# Patient Record
Sex: Female | Born: 1940 | Race: White | Hispanic: No | Smoking: Never smoker
Health system: Southern US, Community
[De-identification: ages and names within clinical notes are randomized; demographics above are authoritative.]

## PROBLEM LIST (undated history)

## (undated) DIAGNOSIS — F32A Depression, unspecified: Secondary | ICD-10-CM

## (undated) DIAGNOSIS — I10 Essential (primary) hypertension: Secondary | ICD-10-CM

## (undated) DIAGNOSIS — I499 Cardiac arrhythmia, unspecified: Secondary | ICD-10-CM

## (undated) DIAGNOSIS — E119 Type 2 diabetes mellitus without complications: Secondary | ICD-10-CM

## (undated) DIAGNOSIS — K760 Fatty (change of) liver, not elsewhere classified: Secondary | ICD-10-CM

## (undated) DIAGNOSIS — G4730 Sleep apnea, unspecified: Secondary | ICD-10-CM

## (undated) DIAGNOSIS — E039 Hypothyroidism, unspecified: Secondary | ICD-10-CM

## (undated) DIAGNOSIS — E785 Hyperlipidemia, unspecified: Secondary | ICD-10-CM

## (undated) DIAGNOSIS — F329 Major depressive disorder, single episode, unspecified: Secondary | ICD-10-CM

## (undated) DIAGNOSIS — R251 Tremor, unspecified: Secondary | ICD-10-CM

## (undated) DIAGNOSIS — R0600 Dyspnea, unspecified: Secondary | ICD-10-CM

## (undated) HISTORY — PX: OTHER SURGICAL HISTORY: SHX169

## (undated) HISTORY — PX: TEAR DUCT PROBING: SHX793

## (undated) HISTORY — PX: BACK SURGERY: SHX140

---

## 2003-11-16 ENCOUNTER — Ambulatory Visit: Payer: Self-pay | Admitting: Pain Medicine

## 2004-07-14 ENCOUNTER — Ambulatory Visit: Payer: Self-pay

## 2004-10-27 ENCOUNTER — Ambulatory Visit: Payer: Self-pay | Admitting: Family Medicine

## 2005-03-30 ENCOUNTER — Ambulatory Visit: Payer: Self-pay | Admitting: Orthopedic Surgery

## 2005-09-25 ENCOUNTER — Ambulatory Visit: Payer: Self-pay | Admitting: Family Medicine

## 2005-09-28 ENCOUNTER — Ambulatory Visit: Payer: Self-pay | Admitting: Surgery

## 2005-09-28 ENCOUNTER — Other Ambulatory Visit: Payer: Self-pay

## 2005-09-29 ENCOUNTER — Ambulatory Visit: Payer: Self-pay | Admitting: Surgery

## 2006-10-08 ENCOUNTER — Ambulatory Visit: Payer: Self-pay | Admitting: Family Medicine

## 2007-03-01 ENCOUNTER — Ambulatory Visit: Payer: Self-pay | Admitting: Family Medicine

## 2007-11-19 ENCOUNTER — Ambulatory Visit: Payer: Self-pay | Admitting: Family Medicine

## 2008-03-26 ENCOUNTER — Ambulatory Visit: Payer: Self-pay | Admitting: Ophthalmology

## 2008-04-06 ENCOUNTER — Ambulatory Visit: Payer: Self-pay | Admitting: Ophthalmology

## 2008-05-05 ENCOUNTER — Ambulatory Visit: Payer: Self-pay | Admitting: Ophthalmology

## 2009-06-01 ENCOUNTER — Ambulatory Visit: Payer: Self-pay | Admitting: Family Medicine

## 2009-06-02 ENCOUNTER — Ambulatory Visit: Payer: Self-pay | Admitting: Family Medicine

## 2009-07-02 ENCOUNTER — Ambulatory Visit: Payer: Self-pay | Admitting: Family Medicine

## 2010-10-13 ENCOUNTER — Ambulatory Visit: Payer: Self-pay | Admitting: Family Medicine

## 2010-12-02 DIAGNOSIS — M1990 Unspecified osteoarthritis, unspecified site: Secondary | ICD-10-CM | POA: Insufficient documentation

## 2010-12-02 DIAGNOSIS — L4050 Arthropathic psoriasis, unspecified: Secondary | ICD-10-CM | POA: Insufficient documentation

## 2011-02-08 DIAGNOSIS — M797 Fibromyalgia: Secondary | ICD-10-CM | POA: Insufficient documentation

## 2011-02-08 DIAGNOSIS — M5030 Other cervical disc degeneration, unspecified cervical region: Secondary | ICD-10-CM | POA: Insufficient documentation

## 2011-03-13 DIAGNOSIS — I872 Venous insufficiency (chronic) (peripheral): Secondary | ICD-10-CM | POA: Insufficient documentation

## 2011-03-13 DIAGNOSIS — I341 Nonrheumatic mitral (valve) prolapse: Secondary | ICD-10-CM | POA: Insufficient documentation

## 2011-05-05 ENCOUNTER — Ambulatory Visit: Payer: Self-pay | Admitting: Unknown Physician Specialty

## 2011-05-30 ENCOUNTER — Ambulatory Visit: Payer: Self-pay | Admitting: Unknown Physician Specialty

## 2011-05-31 LAB — PATHOLOGY REPORT

## 2011-08-30 DIAGNOSIS — M4156 Other secondary scoliosis, lumbar region: Secondary | ICD-10-CM | POA: Insufficient documentation

## 2011-08-30 DIAGNOSIS — M48061 Spinal stenosis, lumbar region without neurogenic claudication: Secondary | ICD-10-CM | POA: Insufficient documentation

## 2011-09-19 DIAGNOSIS — M999 Biomechanical lesion, unspecified: Secondary | ICD-10-CM | POA: Insufficient documentation

## 2012-01-31 DIAGNOSIS — F329 Major depressive disorder, single episode, unspecified: Secondary | ICD-10-CM | POA: Insufficient documentation

## 2012-05-13 ENCOUNTER — Emergency Department: Payer: Self-pay | Admitting: Emergency Medicine

## 2013-10-10 ENCOUNTER — Emergency Department: Payer: Self-pay | Admitting: Emergency Medicine

## 2013-10-15 DIAGNOSIS — S52599A Other fractures of lower end of unspecified radius, initial encounter for closed fracture: Secondary | ICD-10-CM | POA: Insufficient documentation

## 2013-11-26 DIAGNOSIS — R269 Unspecified abnormalities of gait and mobility: Secondary | ICD-10-CM | POA: Insufficient documentation

## 2013-11-26 DIAGNOSIS — R262 Difficulty in walking, not elsewhere classified: Secondary | ICD-10-CM | POA: Insufficient documentation

## 2013-11-26 DIAGNOSIS — M21372 Foot drop, left foot: Secondary | ICD-10-CM | POA: Insufficient documentation

## 2014-02-27 ENCOUNTER — Ambulatory Visit: Payer: Self-pay | Admitting: Family Medicine

## 2014-03-11 ENCOUNTER — Ambulatory Visit: Payer: Self-pay | Admitting: Family Medicine

## 2015-01-27 DIAGNOSIS — E785 Hyperlipidemia, unspecified: Secondary | ICD-10-CM | POA: Insufficient documentation

## 2015-01-27 DIAGNOSIS — E1122 Type 2 diabetes mellitus with diabetic chronic kidney disease: Secondary | ICD-10-CM | POA: Insufficient documentation

## 2015-01-27 DIAGNOSIS — N183 Chronic kidney disease, stage 3 (moderate): Secondary | ICD-10-CM

## 2015-01-27 DIAGNOSIS — E079 Disorder of thyroid, unspecified: Secondary | ICD-10-CM | POA: Insufficient documentation

## 2015-01-27 DIAGNOSIS — K760 Fatty (change of) liver, not elsewhere classified: Secondary | ICD-10-CM | POA: Insufficient documentation

## 2015-01-27 DIAGNOSIS — I10 Essential (primary) hypertension: Secondary | ICD-10-CM | POA: Insufficient documentation

## 2015-11-11 DIAGNOSIS — G8929 Other chronic pain: Secondary | ICD-10-CM | POA: Insufficient documentation

## 2015-11-11 DIAGNOSIS — M2550 Pain in unspecified joint: Secondary | ICD-10-CM

## 2015-11-23 DIAGNOSIS — T85192A Other mechanical complication of implanted electronic neurostimulator (electrode) of spinal cord, initial encounter: Secondary | ICD-10-CM | POA: Insufficient documentation

## 2015-12-10 DIAGNOSIS — G4730 Sleep apnea, unspecified: Secondary | ICD-10-CM | POA: Insufficient documentation

## 2015-12-10 DIAGNOSIS — Z974 Presence of external hearing-aid: Secondary | ICD-10-CM | POA: Insufficient documentation

## 2016-05-31 DIAGNOSIS — R9431 Abnormal electrocardiogram [ECG] [EKG]: Secondary | ICD-10-CM | POA: Insufficient documentation

## 2016-05-31 DIAGNOSIS — R0602 Shortness of breath: Secondary | ICD-10-CM | POA: Insufficient documentation

## 2016-06-05 DIAGNOSIS — M25561 Pain in right knee: Secondary | ICD-10-CM

## 2016-06-05 DIAGNOSIS — G8929 Other chronic pain: Secondary | ICD-10-CM | POA: Insufficient documentation

## 2016-06-12 DIAGNOSIS — I441 Atrioventricular block, second degree: Secondary | ICD-10-CM | POA: Diagnosis present

## 2016-06-12 DIAGNOSIS — I350 Nonrheumatic aortic (valve) stenosis: Secondary | ICD-10-CM | POA: Insufficient documentation

## 2016-06-28 DIAGNOSIS — Z96651 Presence of right artificial knee joint: Secondary | ICD-10-CM | POA: Insufficient documentation

## 2016-10-23 DIAGNOSIS — R42 Dizziness and giddiness: Secondary | ICD-10-CM | POA: Insufficient documentation

## 2016-11-07 DIAGNOSIS — R001 Bradycardia, unspecified: Secondary | ICD-10-CM | POA: Insufficient documentation

## 2016-11-13 ENCOUNTER — Encounter: Payer: Self-pay | Admitting: *Deleted

## 2016-11-13 ENCOUNTER — Other Ambulatory Visit: Payer: Self-pay

## 2016-11-13 ENCOUNTER — Ambulatory Visit
Admission: RE | Admit: 2016-11-13 | Discharge: 2016-11-13 | Disposition: A | Discharge status: Home / Self Care | Payer: Medicare Other | Source: Ambulatory Visit | Attending: Cardiology | Admitting: Cardiology

## 2016-11-13 ENCOUNTER — Encounter
Admission: RE | Admit: 2016-11-13 | Discharge: 2016-11-13 | Disposition: A | Discharge status: Home / Self Care | Payer: Medicare Other | Source: Ambulatory Visit | Attending: Cardiology | Admitting: Cardiology

## 2016-11-13 DIAGNOSIS — Z01812 Encounter for preprocedural laboratory examination: Secondary | ICD-10-CM | POA: Insufficient documentation

## 2016-11-13 DIAGNOSIS — I517 Cardiomegaly: Secondary | ICD-10-CM | POA: Insufficient documentation

## 2016-11-13 DIAGNOSIS — N289 Disorder of kidney and ureter, unspecified: Secondary | ICD-10-CM | POA: Diagnosis not present

## 2016-11-13 DIAGNOSIS — Z01818 Encounter for other preprocedural examination: Secondary | ICD-10-CM

## 2016-11-13 DIAGNOSIS — E669 Obesity, unspecified: Secondary | ICD-10-CM | POA: Diagnosis not present

## 2016-11-13 DIAGNOSIS — Z01810 Encounter for preprocedural cardiovascular examination: Secondary | ICD-10-CM | POA: Insufficient documentation

## 2016-11-13 DIAGNOSIS — I10 Essential (primary) hypertension: Secondary | ICD-10-CM | POA: Insufficient documentation

## 2016-11-13 DIAGNOSIS — F329 Major depressive disorder, single episode, unspecified: Secondary | ICD-10-CM | POA: Diagnosis not present

## 2016-11-13 DIAGNOSIS — G4730 Sleep apnea, unspecified: Secondary | ICD-10-CM | POA: Insufficient documentation

## 2016-11-13 DIAGNOSIS — Z6834 Body mass index (BMI) 34.0-34.9, adult: Secondary | ICD-10-CM | POA: Insufficient documentation

## 2016-11-13 DIAGNOSIS — I4430 Unspecified atrioventricular block: Secondary | ICD-10-CM | POA: Diagnosis not present

## 2016-11-13 DIAGNOSIS — R001 Bradycardia, unspecified: Secondary | ICD-10-CM | POA: Diagnosis not present

## 2016-11-13 DIAGNOSIS — Z7984 Long term (current) use of oral hypoglycemic drugs: Secondary | ICD-10-CM | POA: Diagnosis not present

## 2016-11-13 DIAGNOSIS — I495 Sick sinus syndrome: Secondary | ICD-10-CM | POA: Diagnosis not present

## 2016-11-13 DIAGNOSIS — I289 Disease of pulmonary vessels, unspecified: Secondary | ICD-10-CM | POA: Diagnosis not present

## 2016-11-13 DIAGNOSIS — E119 Type 2 diabetes mellitus without complications: Secondary | ICD-10-CM | POA: Diagnosis not present

## 2016-11-13 HISTORY — DX: Depression, unspecified: F32.A

## 2016-11-13 HISTORY — DX: Cardiac arrhythmia, unspecified: I49.9

## 2016-11-13 HISTORY — DX: Major depressive disorder, single episode, unspecified: F32.9

## 2016-11-13 HISTORY — DX: Hyperlipidemia, unspecified: E78.5

## 2016-11-13 HISTORY — DX: Essential (primary) hypertension: I10

## 2016-11-13 HISTORY — DX: Dyspnea, unspecified: R06.00

## 2016-11-13 HISTORY — DX: Type 2 diabetes mellitus without complications: E11.9

## 2016-11-13 HISTORY — DX: Sleep apnea, unspecified: G47.30

## 2016-11-13 HISTORY — DX: Tremor, unspecified: R25.1

## 2016-11-13 HISTORY — DX: Hypothyroidism, unspecified: E03.9

## 2016-11-13 HISTORY — DX: Fatty (change of) liver, not elsewhere classified: K76.0

## 2016-11-13 LAB — PROTIME-INR
INR: 0.95
Prothrombin Time: 12.6 seconds (ref 11.4–15.2)

## 2016-11-13 LAB — CBC
HEMATOCRIT: 36.3 % (ref 35.0–47.0)
HEMOGLOBIN: 11.9 g/dL — AB (ref 12.0–16.0)
MCH: 29 pg (ref 26.0–34.0)
MCHC: 32.9 g/dL (ref 32.0–36.0)
MCV: 88.2 fL (ref 80.0–100.0)
Platelets: 225 10*3/uL (ref 150–440)
RBC: 4.12 MIL/uL (ref 3.80–5.20)
RDW: 15.8 % — ABNORMAL HIGH (ref 11.5–14.5)
WBC: 8.2 10*3/uL (ref 3.6–11.0)

## 2016-11-13 LAB — BASIC METABOLIC PANEL
ANION GAP: 12 (ref 5–15)
BUN: 31 mg/dL — ABNORMAL HIGH (ref 6–20)
CO2: 26 mmol/L (ref 22–32)
Calcium: 9.6 mg/dL (ref 8.9–10.3)
Chloride: 101 mmol/L (ref 101–111)
Creatinine, Ser: 1.2 mg/dL — ABNORMAL HIGH (ref 0.44–1.00)
GFR calc Af Amer: 50 mL/min — ABNORMAL LOW (ref >60)
GFR, EST NON AFRICAN AMERICAN: 43 mL/min — AB (ref >60)
GLUCOSE: 91 mg/dL (ref 65–99)
POTASSIUM: 4.3 mmol/L (ref 3.5–5.1)
Sodium: 139 mmol/L (ref 135–145)

## 2016-11-13 LAB — SURGICAL PCR SCREEN
MRSA, PCR: NEGATIVE
Staphylococcus aureus: POSITIVE — AB

## 2016-11-13 LAB — APTT: APTT: 32 s (ref 24–36)

## 2016-11-13 NOTE — Patient Instructions (Signed)
Your procedure is scheduled on: 11/16/16 Thur  Report to Same Day Surgery 2nd floor medical mall [facility](Medical Mall Entrance-take elevator on left to 2nd floor.  Check in with surgery information desk.) To find out your arrival time please call [phone] between 1PM - 3PM on 11/15/16 Wed Remember: Instructions that are not followed completely may result in serious medical risk, up to and including death, or upon the discretion of your surgeon and anesthesiologist your surgery may need to be rescheduled.    _x___ 1. Do not eat food after midnight the night before your procedure. You may drink clear liquids up to 2 hours before you are scheduled to arrive at the hospital for your procedure.  Do not drink clear liquids within 2 hours of your scheduled arrival to the hospital.  Clear liquids include  --Water or Apple juice without pulp  --Clear carbohydrate beverage such as ClearFast or Gatorade  --Black Coffee or Clear Tea (No milk, no creamers, do not add anything to                  the coffee or Tea Type 1 and type 2 diabetics should only drink water.  No gum chewing or hard candies.     __x__ 2. No Alcohol for 24 hours before or after surgery.   __x__3. No Smoking for 24 prior to surgery.   ____  4. Bring all medications with you on the day of surgery if instructed.    __x__ 5. Notify your doctor if there is any change in your medical condition     (cold, fever, infections).     Do not wear jewelry, make-up, hairpins, clips or nail polish.  Do not wear lotions, powders, or perfumes. You may wear deodorant.  Do not shave 48 hours prior to surgery. Men may shave face and neck.  Do not bring valuables to the hospital.    [facility] is not responsible for any belongings or valuables.               Contacts, dentures or bridgework may not be worn into surgery.  Leave your suitcase in the car. After surgery it may be brought to your room.  For patients admitted to the hospital,  discharge time is determined by your                       treatment team.   Patients discharged the day of surgery will not be allowed to drive home.  You will need someone to drive you home and stay with you the night of your procedure.    Please read over the following fact sheets that you were given:   [facility] and or MRSA Information   _x___ Take anti-hypertensive listed below, cardiac, seizure, asthma,     anti-reflux and psychiatric medicines. These include:  1. buPROPion (WELLBUTRIN XL)  2.DULoxetine (CYMBALTA) 60 MG capsule  3.levothyroxine (SYNTHROID, LEVOTHROID) 75 MCG tablet  4.  5.  6.  ____Fleets enema or Magnesium Citrate as directed.   _x___ Use CHG Soap or sage wipes as directed on instruction sheet   ____ Use inhalers on the day of surgery and bring to hospital day of surgery  _x___ Stop Metformin and Janumet 2 days prior to surgery.    ____ Take 1/2 of usual insulin dose the night before surgery and none on the morning     surgery.   _x___ Follow recommendations from Cardiologist, Pulmonologist  or PCP regarding          stopping Aspirin, Coumadin, Plavix ,Eliquis, Effient, or Pradaxa, and Pletal.  X____Stop Anti-inflammatories such as Advil, Aleve, Ibuprofen, Motrin, Naproxen, Naprosyn, Goodies powders or aspirin products. OK to take Tylenol and                          Celebrex.   _x___ Stop supplements until after surgery.  But may continue Vitamin D, Vitamin B,       and multivitamin.   ____ Bring C-Pap to the hospital.

## 2016-11-15 MED ORDER — CEFAZOLIN SODIUM-DEXTROSE 1-4 GM/50ML-% IV SOLN: 1.0000 g | Freq: Once | INTRAVENOUS | Status: DC

## 2016-11-16 ENCOUNTER — Other Ambulatory Visit: Payer: Self-pay

## 2016-11-16 ENCOUNTER — Encounter
Admission: RE | Disposition: A | Discharge status: Home / Self Care | Payer: Self-pay | Source: Ambulatory Visit | Attending: Cardiology

## 2016-11-16 ENCOUNTER — Ambulatory Visit: Payer: Medicare Other

## 2016-11-16 ENCOUNTER — Encounter: Payer: Self-pay | Admitting: *Deleted

## 2016-11-16 ENCOUNTER — Observation Stay: Payer: Medicare Other

## 2016-11-16 ENCOUNTER — Ambulatory Visit: Payer: Medicare Other | Admitting: Anesthesiology

## 2016-11-16 ENCOUNTER — Observation Stay
Admission: RE | Admit: 2016-11-16 | Discharge: 2016-11-17 | Disposition: A | Discharge status: Home / Self Care | Payer: Medicare Other | Source: Ambulatory Visit | Attending: Cardiology | Admitting: Cardiology

## 2016-11-16 DIAGNOSIS — I341 Nonrheumatic mitral (valve) prolapse: Secondary | ICD-10-CM | POA: Diagnosis not present

## 2016-11-16 DIAGNOSIS — E669 Obesity, unspecified: Secondary | ICD-10-CM | POA: Insufficient documentation

## 2016-11-16 DIAGNOSIS — Z950 Presence of cardiac pacemaker: Secondary | ICD-10-CM

## 2016-11-16 DIAGNOSIS — Z85828 Personal history of other malignant neoplasm of skin: Secondary | ICD-10-CM | POA: Insufficient documentation

## 2016-11-16 DIAGNOSIS — I495 Sick sinus syndrome: Principal | ICD-10-CM | POA: Insufficient documentation

## 2016-11-16 DIAGNOSIS — E1140 Type 2 diabetes mellitus with diabetic neuropathy, unspecified: Secondary | ICD-10-CM | POA: Insufficient documentation

## 2016-11-16 DIAGNOSIS — Z6834 Body mass index (BMI) 34.0-34.9, adult: Secondary | ICD-10-CM | POA: Diagnosis not present

## 2016-11-16 DIAGNOSIS — K760 Fatty (change of) liver, not elsewhere classified: Secondary | ICD-10-CM | POA: Insufficient documentation

## 2016-11-16 DIAGNOSIS — I129 Hypertensive chronic kidney disease with stage 1 through stage 4 chronic kidney disease, or unspecified chronic kidney disease: Secondary | ICD-10-CM | POA: Insufficient documentation

## 2016-11-16 DIAGNOSIS — I441 Atrioventricular block, second degree: Secondary | ICD-10-CM | POA: Diagnosis not present

## 2016-11-16 DIAGNOSIS — E1122 Type 2 diabetes mellitus with diabetic chronic kidney disease: Secondary | ICD-10-CM | POA: Insufficient documentation

## 2016-11-16 DIAGNOSIS — I350 Nonrheumatic aortic (valve) stenosis: Secondary | ICD-10-CM | POA: Insufficient documentation

## 2016-11-16 DIAGNOSIS — E785 Hyperlipidemia, unspecified: Secondary | ICD-10-CM | POA: Insufficient documentation

## 2016-11-16 DIAGNOSIS — H9191 Unspecified hearing loss, right ear: Secondary | ICD-10-CM | POA: Insufficient documentation

## 2016-11-16 DIAGNOSIS — Z7984 Long term (current) use of oral hypoglycemic drugs: Secondary | ICD-10-CM | POA: Insufficient documentation

## 2016-11-16 DIAGNOSIS — E039 Hypothyroidism, unspecified: Secondary | ICD-10-CM | POA: Diagnosis not present

## 2016-11-16 DIAGNOSIS — Z885 Allergy status to narcotic agent status: Secondary | ICD-10-CM | POA: Insufficient documentation

## 2016-11-16 DIAGNOSIS — Z79899 Other long term (current) drug therapy: Secondary | ICD-10-CM | POA: Diagnosis not present

## 2016-11-16 DIAGNOSIS — N189 Chronic kidney disease, unspecified: Secondary | ICD-10-CM | POA: Insufficient documentation

## 2016-11-16 HISTORY — PX: PACEMAKER INSERTION: SHX728

## 2016-11-16 LAB — GLUCOSE, CAPILLARY
GLUCOSE-CAPILLARY: 125 mg/dL — AB (ref 65–99)
Glucose-Capillary: 90 mg/dL (ref 65–99)

## 2016-11-16 SURGERY — INSERTION, CARDIAC PACEMAKER
Anesthesia: General | Site: Chest | Wound class: Clean

## 2016-11-16 MED ORDER — FENTANYL CITRATE (PF) 100 MCG/2ML IJ SOLN: 25.0000 ug | INTRAMUSCULAR | Status: DC | PRN

## 2016-11-16 MED ORDER — SODIUM CHLORIDE 0.9% FLUSH
10.0000 mL | Freq: Three times a day (TID) | INTRAVENOUS | Status: DC
  Administered 2016-11-16: 10 mL via INTRAVENOUS

## 2016-11-16 MED ORDER — PROPOFOL 500 MG/50ML IV EMUL
INTRAVENOUS | Status: DC | PRN
  Administered 2016-11-16: 50 ug/kg/min via INTRAVENOUS

## 2016-11-16 MED ORDER — LISINOPRIL 20 MG PO TABS
30.0000 mg | ORAL_TABLET | Freq: Every day | ORAL | Status: DC
  Administered 2016-11-17: 30 mg via ORAL
  Filled 2016-11-16: qty 1

## 2016-11-16 MED ORDER — GENTAMICIN SULFATE 40 MG/ML IJ SOLN
Freq: Once | INTRAMUSCULAR | Status: DC
  Filled 2016-11-16 (×3): qty 2

## 2016-11-16 MED ORDER — LEVOTHYROXINE SODIUM 75 MCG PO TABS
75.0000 ug | ORAL_TABLET | Freq: Every day | ORAL | Status: DC
  Administered 2016-11-17: 75 ug via ORAL
  Filled 2016-11-16: qty 3
  Filled 2016-11-16: qty 1

## 2016-11-16 MED ORDER — DEXTROSE 5 % IV SOLN
1.0000 g | Freq: Four times a day (QID) | INTRAVENOUS | Status: DC
  Filled 2016-11-16 (×3): qty 10

## 2016-11-16 MED ORDER — SODIUM CHLORIDE 0.9 % IJ SOLN
INTRAMUSCULAR | Status: AC
  Filled 2016-11-16: qty 50

## 2016-11-16 MED ORDER — DULOXETINE HCL 60 MG PO CPEP
60.0000 mg | ORAL_CAPSULE | Freq: Every day | ORAL | Status: DC
  Administered 2016-11-17: 60 mg via ORAL
  Filled 2016-11-16: qty 2
  Filled 2016-11-16: qty 1

## 2016-11-16 MED ORDER — SODIUM CHLORIDE 0.9 % IR SOLN
Status: DC | PRN
  Administered 2016-11-16: 200 mL

## 2016-11-16 MED ORDER — LACTATED RINGERS IV SOLN
INTRAVENOUS | Status: DC | PRN
  Administered 2016-11-16: 16:00:00 via INTRAVENOUS

## 2016-11-16 MED ORDER — FLUTICASONE PROPIONATE 50 MCG/ACT NA SUSP
2.0000 | Freq: Every day | NASAL | Status: DC
  Filled 2016-11-16: qty 16

## 2016-11-16 MED ORDER — ATORVASTATIN CALCIUM 40 MG PO TABS
40.0000 mg | ORAL_TABLET | Freq: Every day | ORAL | Status: DC
  Administered 2016-11-16: 40 mg via ORAL
  Filled 2016-11-16: qty 1
  Filled 2016-11-16: qty 2

## 2016-11-16 MED ORDER — LIDOCAINE 1 % OPTIME INJ - NO CHARGE
INTRAMUSCULAR | Status: DC | PRN
  Administered 2016-11-16: 30 mL

## 2016-11-16 MED ORDER — FENTANYL CITRATE (PF) 100 MCG/2ML IJ SOLN
INTRAMUSCULAR | Status: DC | PRN
  Administered 2016-11-16: 25 ug via INTRAVENOUS

## 2016-11-16 MED ORDER — FENTANYL CITRATE (PF) 100 MCG/2ML IJ SOLN
INTRAMUSCULAR | Status: AC
  Filled 2016-11-16: qty 2

## 2016-11-16 MED ORDER — HYDROCHLOROTHIAZIDE 12.5 MG PO CAPS
12.5000 mg | ORAL_CAPSULE | Freq: Every day | ORAL | Status: DC
  Administered 2016-11-17: 12.5 mg via ORAL
  Filled 2016-11-16: qty 1

## 2016-11-16 MED ORDER — SODIUM CHLORIDE FLUSH 0.9 % IV SOLN
INTRAVENOUS | Status: AC
  Filled 2016-11-16: qty 10

## 2016-11-16 MED ORDER — MEPERIDINE HCL 50 MG/ML IJ SOLN: 6.2500 mg | INTRAMUSCULAR | Status: DC | PRN

## 2016-11-16 MED ORDER — ONDANSETRON HCL 4 MG/2ML IJ SOLN: 4.0000 mg | Freq: Four times a day (QID) | INTRAMUSCULAR | Status: DC | PRN

## 2016-11-16 MED ORDER — FAMOTIDINE 20 MG PO TABS
ORAL_TABLET | ORAL | Status: AC
  Administered 2016-11-16: 20 mg
  Filled 2016-11-16: qty 1

## 2016-11-16 MED ORDER — ACETAMINOPHEN 325 MG PO TABS
325.0000 mg | ORAL_TABLET | ORAL | Status: DC | PRN
  Administered 2016-11-17 (×2): 325 mg via ORAL
  Filled 2016-11-16: qty 1
  Filled 2016-11-16: qty 2

## 2016-11-16 MED ORDER — PROPOFOL 500 MG/50ML IV EMUL
INTRAVENOUS | Status: AC
  Filled 2016-11-16: qty 50

## 2016-11-16 MED ORDER — CEFAZOLIN SODIUM-DEXTROSE 1-4 GM/50ML-% IV SOLN
INTRAVENOUS | Status: AC
Start: 2016-11-16 — End: 2016-11-17
  Filled 2016-11-16: qty 50

## 2016-11-16 MED ORDER — FAMOTIDINE 20 MG PO TABS: 20.0000 mg | ORAL_TABLET | Freq: Once | ORAL | Status: DC

## 2016-11-16 MED ORDER — PROPOFOL 10 MG/ML IV BOLUS
INTRAVENOUS | Status: DC | PRN
  Administered 2016-11-16: 20 mg via INTRAVENOUS
  Administered 2016-11-16: 30 mg via INTRAVENOUS

## 2016-11-16 MED ORDER — DEXTROSE 5 % IV SOLN
1.0000 g | Freq: Four times a day (QID) | INTRAVENOUS | Status: AC
  Administered 2016-11-16 – 2016-11-17 (×3): 1 g via INTRAVENOUS
  Filled 2016-11-16 (×3): qty 10

## 2016-11-16 MED ORDER — METFORMIN HCL 500 MG PO TABS
500.0000 mg | ORAL_TABLET | Freq: Two times a day (BID) | ORAL | Status: DC
Start: 2016-11-16 — End: 2016-11-17
  Administered 2016-11-17: 500 mg via ORAL
  Filled 2016-11-16: qty 1

## 2016-11-16 MED ORDER — LIDOCAINE HCL (PF) 2 % IJ SOLN
INTRAMUSCULAR | Status: AC
  Filled 2016-11-16: qty 10

## 2016-11-16 MED ORDER — MIDAZOLAM HCL 2 MG/2ML IJ SOLN
INTRAMUSCULAR | Status: AC
  Filled 2016-11-16: qty 2

## 2016-11-16 MED ORDER — LIDOCAINE HCL (CARDIAC) 20 MG/ML IV SOLN
INTRAVENOUS | Status: DC | PRN
  Administered 2016-11-16: 60 mg via INTRAVENOUS

## 2016-11-16 MED ORDER — GENTAMICIN SULFATE 40 MG/ML IJ SOLN
INTRAMUSCULAR | Status: AC
  Filled 2016-11-16: qty 2

## 2016-11-16 MED ORDER — SODIUM CHLORIDE 0.9 % IV SOLN
INTRAVENOUS | Status: DC
  Administered 2016-11-16: 13:00:00 via INTRAVENOUS

## 2016-11-16 MED ORDER — MIDAZOLAM HCL 2 MG/2ML IJ SOLN
INTRAMUSCULAR | Status: DC | PRN
  Administered 2016-11-16: 1 mg via INTRAVENOUS

## 2016-11-16 MED ORDER — PIOGLITAZONE HCL 15 MG PO TABS
15.0000 mg | ORAL_TABLET | Freq: Every day | ORAL | Status: DC
  Administered 2016-11-17: 15 mg via ORAL
  Filled 2016-11-16: qty 1

## 2016-11-16 MED ORDER — BUPROPION HCL ER (XL) 150 MG PO TB24
150.0000 mg | ORAL_TABLET | Freq: Every day | ORAL | Status: DC
  Administered 2016-11-17: 150 mg via ORAL
  Filled 2016-11-16 (×2): qty 1

## 2016-11-16 MED ORDER — PROMETHAZINE HCL 25 MG/ML IJ SOLN: 6.2500 mg | INTRAMUSCULAR | Status: DC | PRN

## 2016-11-16 SURGICAL SUPPLY — 40 items
BAG DECANTER FOR FLEXI CONT (MISCELLANEOUS) ×2 IMPLANT
BRUSH SCRUB EZ  4% CHG (MISCELLANEOUS) ×1
BRUSH SCRUB EZ 4% CHG (MISCELLANEOUS) ×1 IMPLANT
CABLE SURG 12 DISP A/V CHANNEL (MISCELLANEOUS) ×2 IMPLANT
CANISTER SUCT 1200ML W/VALVE (MISCELLANEOUS) ×2 IMPLANT
CHLORAPREP W/TINT 26ML (MISCELLANEOUS) ×2 IMPLANT
COVER LIGHT HANDLE STERIS (MISCELLANEOUS) ×4 IMPLANT
COVER MAYO STAND STRL (DRAPES) ×2 IMPLANT
DRAPE C-ARM XRAY 36X54 (DRAPES) ×2 IMPLANT
DRSG TEGADERM 4X4.75 (GAUZE/BANDAGES/DRESSINGS) ×2 IMPLANT
DRSG TELFA 4X3 1S NADH ST (GAUZE/BANDAGES/DRESSINGS) ×2 IMPLANT
ELECT REM PT RETURN 9FT ADLT (ELECTROSURGICAL) ×2
ELECTRODE REM PT RTRN 9FT ADLT (ELECTROSURGICAL) ×1 IMPLANT
GLOVE BIO SURGEON STRL SZ7.5 (GLOVE) ×2 IMPLANT
GLOVE BIO SURGEON STRL SZ8 (GLOVE) ×2 IMPLANT
GOWN STRL REUS W/ TWL LRG LVL3 (GOWN DISPOSABLE) ×1 IMPLANT
GOWN STRL REUS W/ TWL XL LVL3 (GOWN DISPOSABLE) ×1 IMPLANT
GOWN STRL REUS W/TWL LRG LVL3 (GOWN DISPOSABLE) ×1
GOWN STRL REUS W/TWL XL LVL3 (GOWN DISPOSABLE) ×1
HANDLE YANKAUER SUCT BULB TIP (MISCELLANEOUS) ×2 IMPLANT
IMMOBILIZER SHDR MD LX WHT (SOFTGOODS) IMPLANT
IMMOBILIZER SHDR XL LX WHT (SOFTGOODS) ×2 IMPLANT
INTRO PACEMAKR LEAD 9FR 13CM (INTRODUCER) ×2
INTRO PACEMKR SHEATH II 7FR (MISCELLANEOUS) ×4
INTRODUCER PACEMKR LD 9FR 13CM (INTRODUCER) ×1 IMPLANT
INTRODUCER PACEMKR SHTH II 7FR (MISCELLANEOUS) ×2 IMPLANT
IPG PACE AZUR XT DR MRI W1DR01 (Pacemaker) ×1 IMPLANT
IRRIGATOR SUCT 8 DISP DVNC XI (IRRIGATION / IRRIGATOR) ×1 IMPLANT
IRRIGATOR SUCTION 8MM XI DISP (IRRIGATION / IRRIGATOR) ×1
IV NS 500ML (IV SOLUTION) ×1
IV NS 500ML BAXH (IV SOLUTION) ×1 IMPLANT
KIT RM TURNOVER STRD PROC AR (KITS) ×2 IMPLANT
LABEL OR SOLS (LABEL) ×2 IMPLANT
LEAD CAPSURE NOVUS 5076-52CM (Lead) ×2 IMPLANT
LEAD CAPSURE NOVUS 5076-58CM (Lead) ×2 IMPLANT
MARKER SKIN DUAL TIP RULER LAB (MISCELLANEOUS) ×2 IMPLANT
PACE AZURE XT DR MRI W1DR01 (Pacemaker) ×2 IMPLANT
PACK PACE INSERTION (MISCELLANEOUS) ×2 IMPLANT
PAD ONESTEP ZOLL R SERIES ADT (MISCELLANEOUS) ×2 IMPLANT
SUT SILK 0 SH 30 (SUTURE) ×6 IMPLANT

## 2016-11-16 NOTE — Anesthesia Post-op Follow-up Note (Signed)
Anesthesia QCDR form completed.        

## 2016-11-16 NOTE — Anesthesia Preprocedure Evaluation (Signed)
Anesthesia Evaluation  Patient identified by MRN, date of birth, ID band Patient awake    Reviewed: Allergy & Precautions, NPO status , Patient's Chart, lab work & pertinent test results  History of Anesthesia Complications Negative for: history of anesthetic complications  Airway Mallampati: II  TM Distance: >3 FB Neck ROM: Full    Dental  (+) Poor Dentition   Pulmonary sleep apnea , neg COPD,    breath sounds clear to auscultation- rhonchi (-) wheezing      Cardiovascular hypertension, Pt. on medications (-) CAD, (-) Past MI and (-) Cardiac Stents  Rhythm:Regular Rate:Normal - Systolic murmurs and - Diastolic murmurs    Neuro/Psych PSYCHIATRIC DISORDERS Depression negative neurological ROS     GI/Hepatic negative GI ROS, Fatty liver   Endo/Other  diabetes, Oral Hypoglycemic AgentsHypothyroidism   Renal/GU Renal InsufficiencyRenal disease     Musculoskeletal negative musculoskeletal ROS (+)   Abdominal (+) + obese,   Peds  Hematology negative hematology ROS (+)   Anesthesia Other Findings Past Medical History: No date: Depression No date: Diabetes mellitus without complication (HCC) No date: Dyspnea No date: Dysrhythmia No date: Elevated lipids No date: Fatty liver No date: Hypertension No date: Hypothyroidism No date: Sleep apnea No date: Tremors of nervous system   Reproductive/Obstetrics                             Anesthesia Physical Anesthesia Plan  ASA: III  Anesthesia Plan: General   Post-op Pain Management:    Induction: Intravenous  PONV Risk Score and Plan: 2 and Propofol infusion  Airway Management Planned: Natural Airway  Additional Equipment:   Intra-op Plan:   Post-operative Plan:   Informed Consent: I have reviewed the patients History and Physical, chart, labs and discussed the procedure including the risks, benefits and alternatives for the  proposed anesthesia with the patient or authorized representative who has indicated his/her understanding and acceptance.   Dental advisory given  Plan Discussed with: CRNA and Anesthesiologist  Anesthesia Plan Comments:         Anesthesia Quick Evaluation

## 2016-11-16 NOTE — Interval H&P Note (Signed)
History and Physical Interval Note:  11/16/2016 11:55 AM  Chelsea Ruiz  has presented today for surgery, with the diagnosis of SSS  The various methods of treatment have been discussed with the patient and family. After consideration of risks, benefits and other options for treatment, the patient has consented to  Procedure(s): INSERTION PACEMAKER (N/A) as a surgical intervention .  The patient's history has been reviewed, patient examined, no change in status, stable for surgery.  I have reviewed the patient's chart and labs.  Questions were answered to the patient's satisfaction.     Beatris Belen [employer]

## 2016-11-16 NOTE — Plan of Care (Signed)
  Progressing Education: Knowledge of General Education information will improve 11/16/2016 1905 - Progressing by Donald ProseBerry, Kiev Labrosse L, RN Clinical Measurements: Ability to maintain clinical measurements within normal limits will improve 11/16/2016 1905 - Progressing by Donald ProseBerry, Muhanad Torosyan L, RN Pain Managment: General experience of comfort will improve 11/16/2016 1905 - Progressing by Donald ProseBerry, Denys Labree L, RN Note Prn medications   Not Progressing Clinical Measurements: Will remain free from infection 11/16/2016 1905 - Not Progressing by Donald ProseBerry, Sonnet Rizor L, RN Note On IV antibiotics every 6 hours Activity: Risk for activity intolerance will decrease 11/16/2016 1905 - Not Progressing by Donald ProseBerry, Tenelle Andreason L, RN Note Bedrest with bathroom privileges till am

## 2016-11-16 NOTE — Progress Notes (Signed)
76 yo female admitted from PACU s/p pacemaker.  No distress on ra.  Cardiac monitor applied and verified, pt denies chest pain.  Lungs clear bil   Dressing to lt shoulder dry, old drainage noted and circled.  Shoulder immobilizer in place.  SL rt hand, lt ac flushes well.  Pt denies need, CB in reach, SR up x 2.  POC reviewed with pt and daughter.

## 2016-11-16 NOTE — Anesthesia Postprocedure Evaluation (Signed)
Anesthesia Post Note  Patient: Chelsea Ruiz  Procedure(s) Performed: INSERTION PACEMAKER (N/A Chest)  Patient location during evaluation: PACU Anesthesia Type: General Level of consciousness: awake and alert Pain management: pain level controlled Vital Signs Assessment: post-procedure vital signs reviewed and stable Respiratory status: spontaneous breathing and respiratory function stable Cardiovascular status: stable Anesthetic complications: no     Last Vitals:  Vitals:   11/16/16 1247 11/16/16 1651  BP: (!) 183/81 (!) (P) 152/90  Pulse: 60 (P) 93  Resp: 18 (P) 12  Temp: 36.6 C (P) 36.4 C  SpO2: 99% (P) 100%    Last Pain:  Vitals:   11/16/16 1247  TempSrc: Oral  PainSc: 5                  Sandra Tellefsen K

## 2016-11-16 NOTE — H&P (Signed)
Jump to Section ? Document InformationEncounter DetailsGoalsLast Filed Vital SignsPatient DemographicsPatient InstructionsPlan of TreatmentProgress NotesReason for VisitSocial HistoryVisit Diagnoses Linton RumpKatherine M Silveria Encounter Summary, generated on Nov. 15, 2018 Printout Information  Document Contents Document Received Date Document Source Organization  [facility] Visit Nov. 15, 2018 [facility]   Patient Demographics - 76 y.o. Female; born Jun. 20, 1942   Patient Address Communication Language Race / Ethnicity  [address] [place], [place] [address] [phone] [facility](Home) [phone] (Work) [phone] [facility](Mobile) [phone] (Mobile) [email or web address] English (Preferred) Cliffton AstersWhite / Not Hispanic or Latino   Reason for Visit   Reason Comments  Follow-up holter    Encounter Details   Date Type Department Care Team Description  11/07/2016 [facility]  [address]  [place], [place] [address]  [phone]  Marcina MillardParaschos, Jadaya Sommerfield, MD  [address]  [facility]  [place], [place] [address]  [phone]  [phone] (Fax)  Venous insufficiency (Primary Dx);  Nonrheumatic aortic valve stenosis;  Mobitz type 1 second degree atrioventricular block;  Mitral valve prolapse;  Essential hypertension;  Sleep apnea, unspecified type;  Type 2 diabetes mellitus without complication, unspecified whether long term insulin use (CMS-HCC);  SOB (shortness of breath) on exertion;  Lightheadedness;  Hyperlipidemia, unspecified hyperlipidemia type;  Mobitz type 2 second degree atrioventricular block;  Bradycardia, sinus   Social History - as of this encounter  Tobacco Use Types Packs/Day Years Used Date  Never Smoker      Smokeless Tobacco: Never Used      Alcohol Use Drinks/Week oz/Week Comments  No 0 Standard drinks or equivalent  0.0     Sex Assigned at [employer] CorporationBirth Date Recorded  Not on  file    Job Start Date Occupation Industry  Not on file Not on file Not on file   Travel History Travel Start Travel End  No recent travel history available.     Last Filed Vital Signs - in this encounter  Vital Sign Reading Time Taken  Blood Pressure 118/60 11/07/2016 9:45 AM EST  Pulse 56 11/07/2016 9:45 AM EST  Temperature - -  Respiratory Rate - -  Oxygen Saturation - -  Inhaled Oxygen Concentration - -  Weight 77.1 kg (170 lb) 11/07/2016 9:45 AM EST  Height 149.9 cm (4\' 11" ) 11/07/2016 9:45 AM EST  Body Mass Index 34.34 11/07/2016 9:45 AM EST   Patient Instructions - in this encounter  Patient Instructions Marcina MillardParaschos, Higinio Grow, MD - 11/07/2016 9:45 AM EST   Patient Education    DASH Diet: Care Instructions Your Care Instructions  The DASH diet is an eating plan that can help lower your blood pressure. DASH stands for Dietary Approaches to Stop Hypertension. Hypertension is high blood pressure. The DASH diet focuses on eating foods that are high in calcium, potassium, and magnesium. These nutrients can lower blood pressure. The foods that are highest in these nutrients are fruits, vegetables, low-fat dairy products, nuts, seeds, and legumes. But taking calcium, potassium, and magnesium supplements instead of eating foods that are high in those nutrients does not have the same effect. The DASH diet also includes whole grains, fish, and poultry. The DASH diet is one of several lifestyle changes your doctor may recommend to lower your high blood pressure. Your doctor may also want you to decrease the amount of sodium in your diet. Lowering sodium while following the DASH diet can lower blood pressure even further than just the DASH diet alone. Follow-up care is a key part of your  treatment and safety. Be sure to make and go to all appointments, and call your doctor if you are having problems. It's also a good idea to know your test results and keep a list of the medicines you  take. How can you care for yourself at home? Following the DASH diet  Eat 4 to 5 servings of fruit each day. A serving is 1 medium-sized piece of fruit,  cup chopped or canned fruit, 1/4 cup dried fruit, or 4 ounces ( cup) of fruit juice. Choose fruit more often than fruit juice.  Eat 4 to 5 servings of vegetables each day. A serving is 1 cup of lettuce or raw leafy vegetables,  cup of chopped or cooked vegetables, or 4 ounces ( cup) of vegetable juice. Choose vegetables more often than vegetable juice.  Get 2 to 3 servings of low-fat and fat-free dairy each day. A serving is 8 ounces of milk, 1 cup of yogurt, or 1  ounces of cheese.  Eat 6 to 8 servings of grains each day. A serving is 1 slice of bread, 1 ounce of dry cereal, or  cup of cooked rice, pasta, or cooked cereal. Try to choose whole-grain products as much as possible.  Limit lean meat, poultry, and fish to 2 servings each day. A serving is 3 ounces, about the size of a deck of cards.  Eat 4 to 5 servings of nuts, seeds, and legumes (cooked dried beans, lentils, and split peas) each week. A serving is 1/3 cup of nuts, 2 tablespoons of seeds, or  cup of cooked beans or peas.  Limit fats and oils to 2 to 3 servings each day. A serving is 1 teaspoon of vegetable oil or 2 tablespoons of salad dressing.  Limit sweets and added sugars to 5 servings or less a week. A serving is 1 tablespoon jelly or jam,  cup sorbet, or 1 cup of lemonade.  Eat less than 2,300 milligrams (mg) of sodium a day. If you limit your sodium to 1,500 mg a day, you can lower your blood pressure even more. Tips for success  Start small. Do not try to make dramatic changes to your diet all at once. You might feel that you are missing out on your favorite foods and then be more likely to not follow the plan. Make small changes, and stick with them. Once those changes become habit, add a few more changes.  Try some of the following: ? Make it a goal to eat a  fruit or vegetable at every meal and at snacks. This will make it easy to get the recommended amount of fruits and vegetables each day. ? Try yogurt topped with fruit and nuts for a snack or healthy dessert. ? Add lettuce, tomato, cucumber, and onion to sandwiches. ? Combine a ready-made pizza crust with low-fat mozzarella cheese and lots of vegetable toppings. Try using tomatoes, squash, spinach, broccoli, carrots, cauliflower, and onions. ? Have a variety of cut-up vegetables with a low-fat dip as an appetizer instead of chips and dip. ? Sprinkle sunflower seeds or chopped almonds over salads. Or try adding chopped walnuts or almonds to cooked vegetables. ? Try some vegetarian meals using beans and peas. Add garbanzo or kidney beans to salads. Make burritos and tacos with mashed pinto beans or black beans. Where can you learn more? Log in to your [facility] account at [email or web address] and click on top menu option "Health" then select "Search Medical Library". Enter [phone] in the search  box and click the magnify glass to learn more about "DASH Diet: Care Instructions." Current as of: December 08, 2015 Content Version: 11.7  2006-2018 [employer]. Care instructions adapted under license by your healthcare professional. If you have questions about a medical condition or this instruction, always ask your healthcare professional. Healthwise, Incorporated disclaims any warranty or liability for your use of this information.     Electronically Signed by Marcina Millard, MD on 11/07/2016 9:45 AM EST       Progress Notes - in this encounter  Marcina Millard, MD - 11/07/2016 9:45 AM EST Formatting of this note might be different from the original. Established Patient Visit   Chief Complaint: Chief Complaint  Patient presents with  . Follow-up  holter  Date of Service: 11/07/2016 Date of Birth: 01-Sep-1940 PCP: Janann Colonel., MD  History of Present  Illness: Ms. Deshpande is a 76 y.o.female patient who returns for  1. 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 Essential hypertension 3. Hyperlipidemia 4. Aortic stenosis  The patient underwent right total knee replacement surgery 06/14/2016 which appears to be doing well. 48 hour Holter monitor prior to right total knee replacement surgery revealed predominant normal sinus rhythm with a mean heart rate of 66 bpm, with intermittent Mobitz type I second-degree AV block. The patient returned on 10/23/2016 at which time she complained of progressive exertional dyspnea over the past 2-3 months, episodes of dizziness with falling without loss of consciousness. Repeat 24-hour Holter monitor revealed dominant sinus bradycardia with a mean heart rate of 55 bpm, maximum heart rate of only 78 bpm, with episodes of type I as well as type II second-degree AV block. 2D echocardiogram 06/12/2016 revealed normal left ventricular function, with LVEF greater than 55%, with mild mitral and tricuspid regurgitation, mild aortic stenosis with peak gradient 17.5 mmHg, and mean gradient of 8.8 mmHg.  The patient has essential hypertension, blood pressure well controlled on lisinopril and HCTZ, which are well tolerated without apparent side effects. The pressure was 128/60 sitting and 128/62 standing. The patient follows a low-sodium, no added salt diet.  The patient has hyperlipidemia, on atorvastatin, which is well tolerated without apparent side effects, followed by her primary care provider. The patient follows a low-cholesterol, low-fat diet.  Past Medical and Surgical History  Past Medical History Past Medical History:  Diagnosis Date  . Arthritis  psoriatic  . Cataract cortical, senile  . Chronic kidney disease  . Degenerative arthritis of cervical spine  . Degenerative arthritis of lumbar spine  . Diabetes mellitus type 2, uncomplicated (CMS-HCC)  . Fatty liver disease, nonalcoholic  Dr Mechele Collin  . H/O  cardiovascular stress test 10/2011  EF 70%, normal study  . Hearing loss in right ear  . Hyperlipidemia, unspecified  . Hypertension  . Neuropathy associated with endocrine disorder (CMS-HCC)  . Noncompliance with CPAP treatment  . Psoriasis, unspecified  Dr Adolphus Birchwood  . Skin cancer  . Sleep apnea  . Thyroid disease  . Wears hearing aid  right   Past Surgical History She has a past surgical history that includes Lumbar fusion (09/2000); Cholecystectomy; Tympanoplasty (Right, 1997); Skin cancer excision (Right); egd (05/30/2011); Tubal ligation; Back surgery; Colonoscopy (05/30/2011); Joint replacement (Left, 1995); NASAL ENDOSCOPY DCR, RIGHT EYE, WITH CRAWFORD TUBE INSERTION, BILATERAL LOWER LID ECTROPION REPAIR VIA SOOF LIFT WITH MYOCUTANEOUS FLAP, NASAL/SINUS ENDOSCOPY WITH BIOPSY, LEFT EYE (Right, 10/02/2016); INTUBATION NASOLACRIMAL DUCT CRAWFORD TUBE (Right, 10/02/2016); REPAIR OF ECTROPION; EXTENSIVE (EG, TARSAL STRIP OPERATIONS) (Bilateral, 10/02/2016);  SOOF LIFT FLAP MYOCUTANEOUS / FASCIOCUTANEOUS HEAD & NECK (Bilateral, 10/02/2016); NASAL/SINUS ENDOSCOPY, SURGICAL; WITH BIOPSY, POLYPECTOMY OR DEBRIDEMENT (SEPARATE PROCEDURE) (Left, 10/02/2016); ARTHROPLASTY, KNEE, CONDYLE AND PLATEAU; MEDIAL AND LATERAL COMPARTMENTSWITH OR WITHOUT PATELLA RESURFACING (TOTAL KNEE ARTHROPLASTY) (Right, 06/14/2016); REMOVAL SPINAL NEUROSTIMULATOR ELECTRODES (N/A, 12/16/2015); REMOVE SPINAL NEUROSTIM/RECEIVER (Left, 12/16/2015); Implantation of Neurostimulator Pulse generator (N/A, 10/15/2012); ANALYSIS IMPLANTED NEUROSTIMULATOR PULSE GENERATOR (N/A, 10/15/2012); and Tunneled percutaneous thoracic spinal cord stimulator trial without laminectomy (Bilateral, 10/08/2012).   Medications and Allergies  Current Medications  Current Outpatient Medications  Medication Sig Dispense Refill  . amoxicillin (AMOXIL) 500 MG capsule Take 500 mg by mouth once daily. Reported on 01/06/2015  . atorvastatin (LIPITOR) 80 MG tablet  TAKE 1/2 TABLET BY MOUTH ONCE DAILY 45 tablet 1  . blood glucose diagnostic (FREESTYLE LITE STRIPS) test strip USE UTD TID  . blood glucose diagnostic test strip Use once daily. Use as instructed. 100 each 12  . buPROPion (WELLBUTRIN XL) 150 MG XL tablet TAKE 1 TABLET(150 MG) BY MOUTH EVERY DAY 30 tablet 2  . cetirizine (ZYRTEC) 5 MG tablet Take 5 mg by mouth once daily as needed for Allergies.  . DULoxetine (CYMBALTA) 60 MG DR capsule TAKE 1 CAPSULE(60 MG) BY MOUTH EVERY DAY 30 capsule 3  . fluticasone (FLONASE) 50 mcg/actuation nasal spray Place 2 sprays into the right nostril once daily. 16 g 11  . FREESTYLE LITE STRIPS test strip USE AS DIRECTED THREE TIMES DAILY 90 each 0  . hydroCHLOROthiazide (HYDRODIURIL) 12.5 MG tablet TAKE 1 TABLET(12.5 MG) BY MOUTH EVERY DAY 30 tablet 6  . levothyroxine (SYNTHROID, LEVOTHROID) 75 MCG tablet Take 1 tablet (75 mcg total) by mouth every morning. 90 tablet 3  . lisinopril (PRINIVIL,ZESTRIL) 30 MG tablet TAKE 1 TABLET BY MOUTH EVERY DAY 30 tablet 6  . metFORMIN (GLUCOPHAGE) 500 MG tablet TAKE 1 TABLET(500 MG) BY MOUTH TWICE DAILY WITH MEALS 60 tablet 2  . multivitamin tablet Take 1 tablet by mouth every morning.  Melene Muller ON 01/22/2017] oxyMORphone (OPANA) 5 MG tablet Take 1 tablet (5 mg total) by mouth every 8 (eight) hours as needed for Pain 1-2 every 6-8 hours 45 tablet 0  . [START ON 12/25/2016] oxyMORphone (OPANA) 5 MG tablet Take 1 tablet (5 mg total) by mouth every 8 (eight) hours as needed for Pain 1-2 every 6-8 hours 45 tablet 0  . [START ON 11/22/2016] oxyMORphone (OPANA) 5 MG tablet Take 1 tablet (5 mg total) by mouth every 8 (eight) hours as needed for Pain 1-2 every 6-8 hours 45 tablet 0  . pioglitazone (ACTOS) 15 MG tablet TAKE 1 TABLET(15 MG) BY MOUTH EVERY DAY 30 tablet 6   No current facility-administered medications for this visit.   Allergies: Methadone; Oxycodone; and Morphine  Social and Family History  Social History reports that  she has never smoked. She has never used smokeless tobacco. She reports that she does not drink alcohol or use drugs.  Family History Family History  Problem Relation Age of Onset  . Cancer Mother  pancreatic  . Colon cancer Mother  . Osteoarthritis Father  . Heart disease Father  . Stroke Father  . Cancer Brother  pancreatic  . Colon cancer Brother  . Osteoarthritis Brother  . Colon polyps Brother  . Anesthesia problems Neg Hx  . Malignant hypertension Neg Hx   Review of Systems   Review of Systems: The patient denies chest pain, reports progressive exertional shortness of breath, without orthopnea, paroxysmal nocturnal dyspnea, with chronic pedal  edema, without palpitations, heart racing, presyncope, syncope. Review of 12 Systems is negative except as described above.  Physical Examination   Vitals:BP 118/60  Pulse 56  Ht 149.9 cm (4\' 11" )  Wt 77.1 kg (170 lb)  LMP (LMP Unknown)  BMI 34.34 kg/m  Ht:149.9 cm (4\' 11" ) Wt:77.1 kg (170 lb) ZOX:WRUEBSA:Body surface area is 1.79 meters squared. Body mass index is 34.34 kg/m.  HEENT: Pupils equally reactive to light and accomodation  Neck: Supple without thyromegaly, carotid pulses 2+ Lungs: clear to auscultation bilaterally; no wheezes, rales, rhonchi Heart: Regular rate and rhythm. No gallops, murmurs or rub Abdomen: soft nontender, nondistended, with normal bowel sounds Extremities: no cyanosis, clubbing, or edema Peripheral Pulses: 2+ in all extremities, 2+ femoral pulses bilaterally  Assessment   76 y.o. female with  1. Venous insufficiency  2. Nonrheumatic aortic valve stenosis  3. Mobitz type 1 second degree atrioventricular block  4. Mitral valve prolapse  5. Essential hypertension  6. Sleep apnea, unspecified type  7. Type 2 diabetes mellitus without complication, unspecified whether long term insulin use (CMS-HCC)  8. SOB (shortness of breath) on exertion  9. Lightheadedness  10. Hyperlipidemia, unspecified  hyperlipidemia type  11. Mobitz type 2 second degree atrioventricular block  12. Bradycardia, sinus   76 year old female referred for preoperative cardiovascular evaluation prior to right total knee replacement. Preoperative ECG revealed evidence for second-degree AV block. 48 hour Holter monitor revealed predominant normal sinus rhythm with Mobitz type I second-degree AV block. 2D echocardiogram revealed normal left ventricular function, with mild aortic stenosis. Essential hypertension, blood pressure well controlled on current BP medications. Patient underwent successful right total knee replacement surgery. She returns today, reports recurrent episodes of falling, with associated lightheadedness vertigo, without loss of consciousness, with progressive exertional dyspnea. Repeat 48-hour Holter monitor revealed worsening bradycardia at a rate of 55 bpm, with a maximum heart rate of only 78 bpm, with new episodes of type II second-degree AV block.  Plan   1. Continue current medications 2. Counseled patient about low-sodium diet 3. DASH diet printed instructions given to the patient 4. Counseled patient about low-cholesterol diet 5. Continue atorvastatin for hyperlipidemia management 6. Low-fat and cholesterol diet printed instructions given to the patient 7. Permanent pacemaker implantation. We discussed the risks, benefits and alternatives of dual-chamber pacemaker implantation and signed informed consent was obtained. 8. Return to clinic after permanent pacemaker implantation  No orders of the defined types were placed in this encounter.  Return in about 1 week (around 11/14/2016), or after pacemaker.  Marcina MillardALEXANDER Shantelle Alles, MD PhD [facility]    Electronically Signed by Marcina MillardParaschos, Makiah Foye, MD on 11/07/2016 9:45 AM EST     Plan of Treatment - as of this encounter  Upcoming Encounters Upcoming Encounters  Date Type Specialty Care Team Description  01/01/2017 Office Visit Family  Medicine Feldpausch, Domingo Cockingale Eldred Jr., MD  [address]  [place], [place] [address]  [phone]  [phone] (Fax)    01/17/2017 Office Visit Cardiology Siyah Mault, Lyn HollingsheadAlexander, MD  [address]  [facility]  [place], [place] [address]  [phone]  [phone] (Fax)    02/05/2017 Office Visit Pain Medicine Loni Dollyavis, Emily Adair, NP  [phone]  [place], [place] [address]  [phone]  [phone] (Fax)     Goals - as of this encounter  Goal Patient Goal Type Associated Problems Recent Progress Patient-Stated? Author  Exercise-rejoin pool   Exercise   No Richrd PrimeAndreassi, Maureen Patricia, MD   Visit Diagnoses - in this encounter  Diagnosis  Venous insufficiency - Primary  Unspecified venous (peripheral) insufficiency   Nonrheumatic aortic valve stenosis   Mobitz type 1 second degree atrioventricular block  Other second degree atrioventricular block   Mitral valve prolapse  Mitral valve disorders   Essential hypertension   Sleep apnea, unspecified type   Type 2 diabetes mellitus without complication, unspecified whether long term insulin use (CMS-HCC)   SOB (shortness of breath) on exertion  Shortness of breath   Lightheadedness  Dizziness and giddiness   Hyperlipidemia, unspecified hyperlipidemia type   Mobitz type 2 second degree atrioventricular block  Mobitz (type) II atrioventricular block   Bradycardia, sinus  Other specified cardiac dysrhythmias    Images Document Information  Primary Care Provider Other Service Providers Document Coverage Dates  Jamelle Haring MD (Oct. 10, 2017 - Present) [phone] (Work) [phone] (Fax) [address] [place], [place] [address]  Olena Heckle MD [phone] (Work) [phone] (Fax) [address] [facility] [place], [place] [address]  Fulton Mole MD (Consulting Provider) [phone] (Work) [phone]  (Fax) [address] [facility] [address] [place], [place] [address]  Angus Seller MD (Consulting Provider) [phone] (Work) [phone] (Fax) [address] [facility] [place], [place] [address]  Burman Blacksmith MD (Consulting Provider) [phone] (Work) [phone] (Fax) [address] [place], [place] [address]  Nov. 06, 2018   [employer] Organization  [facility] [address] [place], [place] [address]   Encounter Providers Encounter Date  Marcina Millard MD (Attending) [phone] (Work) [phone] (Fax) [address] [facility] [place], [place] [address]  Nov. 06, 2018    Show All Sections

## 2016-11-16 NOTE — Transfer of Care (Signed)
Immediate Anesthesia Transfer of Care Note  Patient: Chelsea Ruiz  Procedure(s) Performed: INSERTION PACEMAKER (N/A Chest)  Patient Location: PACU  Anesthesia Type:General  Level of Consciousness: awake  Airway & Oxygen Therapy: Patient Spontanous Breathing and Patient connected to nasal cannula oxygen  Post-op Assessment: Report given to RN and Post -op Vital signs reviewed and stable  Post vital signs: Reviewed and stable  Last Vitals:  Vitals:   11/16/16 1247 11/16/16 1651  BP: (!) 183/81 (!) (P) 152/90  Pulse: 60 (P) 93  Resp: 18 (P) 12  Temp: 36.6 C (P) 36.4 C  SpO2: 99% (P) 100%    Last Pain:  Vitals:   11/16/16 1247  TempSrc: Oral  PainSc: 5          Complications: No apparent anesthesia complications

## 2016-11-16 NOTE — Op Note (Signed)
[facility]   11/16/2016                     4:53 PM  PATIENT:  Chelsea Ruiz    PRE-OPERATIVE DIAGNOSIS:  sick sinus syndrome  POST-OPERATIVE DIAGNOSIS:  Same  PROCEDURE:  INSERTION PACEMAKER  SURGEON:  Marcina MillardAlexander Lathyn Griggs, MD    ANESTHESIA:     PREOPERATIVE INDICATIONS:  [name] is a  76 y.o. female with a diagnosis of sick sinus syndrome who failed conservative measures and elected for surgical management.    The risks benefits and alternatives were discussed with the patient preoperatively including but not limited to the risks of infection, bleeding, cardiopulmonary complications, the need for revision surgery, among others, and the patient was willing to proceed.   OPERATIVE PROCEDURE: The patient was brought to the operating room in fasting state.  The left pectoral region was prepped and draped in usual sterile manner.  Anesthesia was obtained 1% lidocaine locally.  A 6 cm incision was performed the left pectoral region.  The pacemaker pocket was generated by electrocautery and blunt dissection.  Access was obtained to the left subclavian vein by fine-needle aspiration.  MRI compatible leads were positioned into the right ventricular apical septum ( Medtronic [id number] ) and right atrial appendage ( Medtronic [id number] ) under fluoroscopic guidance.  After proper thresholds were obtained the leads were sutured in place.  Leads were connected to an MRI compatible dual-chamber rate responsive pacemaker generator ( Medtronic [id number] ).  The pacemaker pocket was irrigated with gentamicin solution.  Pacemaker generator was positioned into the pocket and the pocket was closed with 2-0 and 4-0 Vicryl, respectively.  Steri-Strips and pressure dressing were applied.  There were no periprocedural complications.  Postprocedural pacemaker interrogation revealed appropriate dual-chamber atrial and ventricular sensing and pacing thresholds.

## 2016-11-17 ENCOUNTER — Encounter: Payer: Self-pay | Admitting: Cardiology

## 2016-11-17 DIAGNOSIS — I495 Sick sinus syndrome: Secondary | ICD-10-CM | POA: Diagnosis not present

## 2016-11-17 LAB — GLUCOSE, CAPILLARY: Glucose-Capillary: 193 mg/dL — ABNORMAL HIGH (ref 65–99)

## 2016-11-17 MED ORDER — CEPHALEXIN 250 MG PO CAPS
250.0000 mg | ORAL_CAPSULE | Freq: Four times a day (QID) | ORAL | Status: DC
  Administered 2016-11-17: 250 mg via ORAL
  Filled 2016-11-17: qty 1

## 2016-11-17 MED ORDER — CEPHALEXIN 250 MG PO CAPS: 250.0000 mg | ORAL_CAPSULE | Freq: Four times a day (QID) | ORAL | 0 refills | Status: DC

## 2016-11-17 NOTE — Progress Notes (Signed)
Pt d/c to home today.  IV removed intact.  D/c paperwork printed and reviewed w/pt.  All medication questions and concerns reviewed and pt states understanding.  All Rx's given to patient. Pt was wheelchaired out for d/c.    

## 2016-11-17 NOTE — Discharge Summary (Signed)
Physician Discharge Summary  Patient ID: Yolanda BonineKatherine Maness Worrel MRN: [id number] DOB/AGE: [birth year] 76 y.o.  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  Postprocedural chest x-ray did not reveal evidence for pneumothorax.  The patient was returned to telemetry where she had an uncomplicated hospital course.  Telemetry revealed predominant atrial sensing with ventricular pacing.   Discharge Exam: Blood pressure (!) 152/90, pulse 99, temperature 98.6 F (37 C), temperature source Oral, resp. rate 18, height 4\' 11"  (1.499 m), weight 77.1 kg (170 lb), SpO2 96 %.    Labs:   Lab Results  Component Value Date   WBC 8.2 11/13/2016   HGB 11.9 (L) 11/13/2016   HCT 36.3 11/13/2016   MCV 88.2 11/13/2016   PLT 225 11/13/2016    Recent Labs  Lab 11/13/16 1223  NA 139  K 4.3  CL 101  CO2 26  BUN 31*  CREATININE 1.20*  CALCIUM 9.6  GLUCOSE 91      Radiology: No pneumothorax EKG: Atrial sensing with ventricular pacing  FOLLOW UP PLANS AND APPOINTMENTS  Allergies as of 11/17/2016      Reactions   Methadone Other (See Comments)   Hallucinations and memory   Morphine And Related Other (See Comments)   Abnormal thinking and memory loss   Oxycodone Other (See Comments)   Hallucinations and memory      Medication List    TAKE these medications   amoxicillin 500 MG capsule Commonly known as:  AMOXIL Take 2,000 mg See admin instructions by mouth. Take 2000 mg by mouth 1 hour prior to dental appointment   aspirin EC 81 MG tablet Take 81 mg daily by mouth.   atorvastatin 80 MG tablet Commonly known as:  LIPITOR Take 40 mg daily by mouth.   buPROPion 150 MG 24 hr tablet Commonly known  as:  WELLBUTRIN XL Take 150 mg daily by mouth.   cephALEXin 250 MG capsule Commonly known as:  KEFLEX Take 1 capsule (250 mg total) 4 (four) times daily by mouth.   DULoxetine 60 MG capsule Commonly known as:  CYMBALTA Take 60 mg daily by mouth.   GENTEAL OP Place 1 drop as needed into both eyes (for dry eyes).   hydrochlorothiazide 12.5 MG tablet Commonly known as:  HYDRODIURIL Take 12.5 mg daily by mouth.   levothyroxine 75 MCG tablet Commonly known as:  SYNTHROID, LEVOTHROID Take 75 mcg daily by mouth.   lisinopril 30 MG tablet Commonly known as:  PRINIVIL,ZESTRIL Take 30 mg daily by mouth.   metFORMIN 500 MG tablet Commonly known as:  GLUCOPHAGE Take 500 mg 2 (two) times daily by mouth.   MULTIVITAMIN PO Take 1 tablet daily by mouth.   oxymorphone 5 MG tablet Commonly known as:  OPANA Take 5 mg every 8 (eight) hours as needed by mouth for pain.   pioglitazone 15 MG tablet Commonly known as:  ACTOS Take 15 mg daily by mouth.      Follow-up Information    Andrei Mccook, MD Follow up in 1 week(s).   Specialty:  Cardiology Contact information: [address] [facility] [place] [address] [phone]           BRING ALL MEDICATIONS WITH YOU TO FOLLOW UP APPOINTMENTS  Time spent with patient to include physician  time: 25 minutes Signed:  Marcina MillardAlexander Lorrie Gargan MD, PhD, [facility] 11/17/2016, 8:11 AM

## 2016-11-17 NOTE — Discharge Instructions (Addendum)
Pacemaker Implantation, Adult, Care After This sheet gives you information about how to care for yourself after your procedure. Your health care provider may also give you more specific instructions. If you have problems or questions, contact your health care provider. What can I expect after the procedure? After the procedure, it is common to have:  Mild pain.  Slight bruising.  Some swelling over the incision.  A slight bump over the skin where the device was placed. Sometimes, it is possible to feel the device under the skin. This is normal.  Follow these instructions at home: Medicines  Take over-the-counter and prescription medicines only as told by your health care provider.  If you were prescribed an antibiotic medicine, take it as told by your health care provider. Do not stop taking the antibiotic even if you start to feel better. Wound care  Do not remove the bandage on your chest until directed to do so by your health care provider.  After your bandage is removed, you may see pieces of tape called skin adhesive strips over the area where the cut was made (incision site). Let them fall off on their own.  Check the incision site every day to make sure it is not infected, bleeding, or starting to pull apart.  Do not use lotions or ointments near the incision site unless directed to do so.  Keep the incision area clean and dry for 2-3 days after the procedure or as directed by your health care provider. It takes several weeks for the incision site to completely heal.  Do not take baths, swim, or use a hot tub for 7-10 days or as otherwise directed by your health care provider. Activity  Do not drive or use heavy machinery while taking prescription pain medicine.  Do not drive for 24 hours if you were given a medicine to help you relax (sedative).  Check with your health care provider before you start to drive or play sports.  Avoid sudden jerking, pulling, or chopping  movements that pull your upper arm far away from your body. Avoid these movements for at least 6 weeks or as long as told by your health care provider.  Do not lift your upper arm above your shoulders for at least 6 weeks or as long as told by your health care provider. This means no tennis, golf, or swimming.  You may go back to work when your health care provider says it is okay. Pacemaker care  You may be shown how to transfer data from your pacemaker through the phone to your health care provider.  Always let all health care providers know about your pacemaker before you have any medical procedures or tests.  Wear a medical ID bracelet or necklace stating that you have a pacemaker. Carry a pacemaker ID card with you at all times.  Your pacemaker battery will last for 5-15 years. Routine checks by your health care provider will let the health care provider know when the battery is starting to run down. The pacemaker will need to be replaced when the battery starts to run down.  Do not use amateur Proofreaderradio equipment or electric welding torches. Other electrical devices are safe to use, including power tools, lawn mowers, and speakers. If you are unsure of whether something is safe to use, ask your health care provider.  When using your cell phone, hold it to the ear opposite the pacemaker. Do not leave your cell phone in a pocket over the pacemaker.  Avoid places or objects that have a strong electric or magnetic field, including: ? Airport Actuarysecurity gates. When at the airport, let officials know that you have a pacemaker. ? Power plants. ? Large electrical generators. ? Radiofrequency transmission towers, such as cell phone and radio towers. General instructions  Weigh yourself every day. If you suddenly gain weight, fluid may be building up in your body.  Keep all follow-up visits as told by your health care provider. This is important. Contact a health care provider if:  You gain  weight suddenly.  Your legs or feet swell.  It feels like your heart is fluttering or skipping beats (heart palpitations).  You have chills or a fever.  You have more redness, swelling, or pain around your incisions.  You have more fluid or blood coming from your incisions.  Your incisions feel warm to the touch.  You have pus or a bad smell coming from your incisions. Get help right away if:  You have chest pain.  You have trouble breathing or are short of breath.  You become extremely tired.  You are light-headed or you faint. This information is not intended to replace advice given to you by your health care provider. Make sure you discuss any questions you have with your health care provider. Document Released: 07/08/2004 Document Revised: 10/01/2015 Document Reviewed: 10/01/2015 Elsevier Interactive Patient Education  2018 [employer] Do not lift left arm above head.  Patient may shower 11/18/2016.

## 2016-11-27 DIAGNOSIS — Z950 Presence of cardiac pacemaker: Secondary | ICD-10-CM | POA: Insufficient documentation

## 2017-01-03 ENCOUNTER — Ambulatory Visit
Admission: RE | Admit: 2017-01-03 | Discharge: 2017-01-03 | Disposition: A | Discharge status: Home / Self Care | Payer: Medicare Other | Source: Ambulatory Visit | Attending: Cardiology | Admitting: Cardiology

## 2017-01-03 ENCOUNTER — Other Ambulatory Visit: Payer: Self-pay | Admitting: Cardiology

## 2017-01-03 DIAGNOSIS — M7989 Other specified soft tissue disorders: Secondary | ICD-10-CM | POA: Insufficient documentation

## 2017-01-03 DIAGNOSIS — M79605 Pain in left leg: Secondary | ICD-10-CM

## 2017-01-03 DIAGNOSIS — R600 Localized edema: Secondary | ICD-10-CM | POA: Insufficient documentation

## 2017-11-13 ENCOUNTER — Emergency Department: Payer: Medicare Other

## 2017-11-13 ENCOUNTER — Encounter: Payer: Self-pay | Admitting: Emergency Medicine

## 2017-11-13 ENCOUNTER — Emergency Department
Admission: EM | Admit: 2017-11-13 | Discharge: 2017-11-13 | Disposition: A | Discharge status: Home / Self Care | Payer: Medicare Other | Attending: Emergency Medicine | Admitting: Emergency Medicine

## 2017-11-13 DIAGNOSIS — Z7984 Long term (current) use of oral hypoglycemic drugs: Secondary | ICD-10-CM | POA: Diagnosis not present

## 2017-11-13 DIAGNOSIS — E039 Hypothyroidism, unspecified: Secondary | ICD-10-CM | POA: Insufficient documentation

## 2017-11-13 DIAGNOSIS — E119 Type 2 diabetes mellitus without complications: Secondary | ICD-10-CM | POA: Insufficient documentation

## 2017-11-13 DIAGNOSIS — Y999 Unspecified external cause status: Secondary | ICD-10-CM | POA: Diagnosis not present

## 2017-11-13 DIAGNOSIS — Z7982 Long term (current) use of aspirin: Secondary | ICD-10-CM | POA: Insufficient documentation

## 2017-11-13 DIAGNOSIS — S0083XA Contusion of other part of head, initial encounter: Secondary | ICD-10-CM | POA: Insufficient documentation

## 2017-11-13 DIAGNOSIS — I10 Essential (primary) hypertension: Secondary | ICD-10-CM | POA: Diagnosis not present

## 2017-11-13 DIAGNOSIS — S0990XA Unspecified injury of head, initial encounter: Secondary | ICD-10-CM | POA: Diagnosis present

## 2017-11-13 DIAGNOSIS — Y939 Activity, unspecified: Secondary | ICD-10-CM | POA: Diagnosis not present

## 2017-11-13 DIAGNOSIS — W19XXXA Unspecified fall, initial encounter: Secondary | ICD-10-CM

## 2017-11-13 DIAGNOSIS — Z79899 Other long term (current) drug therapy: Secondary | ICD-10-CM | POA: Insufficient documentation

## 2017-11-13 DIAGNOSIS — Y929 Unspecified place or not applicable: Secondary | ICD-10-CM | POA: Diagnosis not present

## 2017-11-13 DIAGNOSIS — Z950 Presence of cardiac pacemaker: Secondary | ICD-10-CM | POA: Diagnosis not present
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
## 2017-12-12 DIAGNOSIS — R413 Other amnesia: Secondary | ICD-10-CM | POA: Insufficient documentation

## 2018-01-28 ENCOUNTER — Ambulatory Visit: Payer: Medicare Other | Admitting: Nurse Practitioner

## 2018-02-06 ENCOUNTER — Ambulatory Visit: Payer: Medicare Other | Admitting: Nurse Practitioner

## 2018-02-20 ENCOUNTER — Encounter: Payer: Self-pay | Admitting: Nurse Practitioner

## 2018-02-20 ENCOUNTER — Ambulatory Visit: Payer: Medicare Other | Attending: Nurse Practitioner | Admitting: Nurse Practitioner

## 2018-02-20 VITALS — BP 124/71 | HR 95 | Temp 97.7°F | Resp 16 | Ht 59.0 in | Wt 184.0 lb

## 2018-02-20 DIAGNOSIS — M542 Cervicalgia: Secondary | ICD-10-CM | POA: Insufficient documentation

## 2018-02-20 DIAGNOSIS — Z789 Other specified health status: Secondary | ICD-10-CM

## 2018-02-20 DIAGNOSIS — M47816 Spondylosis without myelopathy or radiculopathy, lumbar region: Secondary | ICD-10-CM | POA: Insufficient documentation

## 2018-02-20 DIAGNOSIS — Z79891 Long term (current) use of opiate analgesic: Secondary | ICD-10-CM

## 2018-02-20 DIAGNOSIS — Z79899 Other long term (current) drug therapy: Secondary | ICD-10-CM

## 2018-02-20 DIAGNOSIS — M545 Low back pain, unspecified: Secondary | ICD-10-CM

## 2018-02-20 DIAGNOSIS — M899 Disorder of bone, unspecified: Secondary | ICD-10-CM

## 2018-02-20 DIAGNOSIS — M79641 Pain in right hand: Secondary | ICD-10-CM | POA: Diagnosis present

## 2018-02-20 DIAGNOSIS — G8929 Other chronic pain: Secondary | ICD-10-CM

## 2018-02-20 DIAGNOSIS — G894 Chronic pain syndrome: Secondary | ICD-10-CM

## 2018-02-20 DIAGNOSIS — M79642 Pain in left hand: Secondary | ICD-10-CM

## 2018-02-20 DIAGNOSIS — M25561 Pain in right knee: Secondary | ICD-10-CM | POA: Insufficient documentation

## 2018-02-20 NOTE — Patient Instructions (Signed)

## 2018-02-20 NOTE — Progress Notes (Signed)
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 kg)  11/13/16 170 lb (77.1 kg)  Psych/Mental status: Alert, oriented x 3 (person, place, & time)       Eyes: PERLA Respiratory: No evidence of acute respiratory distress  Cervical Spine Exam  Inspection: No masses, redness, or swelling No surgical scar located Alignment: Symmetrical Functional ROM: Decreased ROM      Stability: No instability detected Muscle strength & Tone: Functionally intact Sensory: Unimpaired Palpation: Complains of area being tender to palpation              Upper Extremity (UE) Exam    Side: Right upper extremity  Side: Left upper extremity  Inspection: Heberden's nodes (DIP)  Inspection: Heberden's nodes (DIP)  Functional ROM: Decreased ROM          Functional ROM: Decreased ROM          Muscle strength & Tone: Movement possible against gravity, but not against resistance (3/5)  Muscle strength & Tone: Movement possible against gravity, but not against resistance (3/5)  Sensory: Movement-associated discomfort  Sensory: Movement-associated discomfort   Palpation: Uncomfortable              Palpation: Uncomfortable              Specialized Test(s): Deferred         Specialized Test(s): Deferred          Thoracic Spine Exam  Inspection: No masses, redness, or swelling Alignment: Symmetrical Functional ROM: Unrestricted ROM Stability: No instability detected Sensory: Unimpaired Muscle strength & Tone: No palpable anomalies  Lumbar Spine Exam  Inspection: Well healed scar from previous spine surgery detected Alignment: Symmetrical Functional ROM: Decreased ROM      Stability: No instability detected Muscle strength & Tone: Functionally intact Sensory: Unimpaired Palpation: Complains of area being tender to palpation       Provocative Tests: Lumbar Hyperextension and rotation test: Unable to perform      unsteady Patrick's Maneuver: Negative  Gait & Posture Assessment  Ambulation: Patient came in today in a wheel chair Gait: Age-related, senile gait pattern Posture: WNL   Lower Extremity Exam    Side: Right lower extremity  Side: Left lower extremity  Inspection: No masses, redness, swelling, or asymmetry. No contractureswell healed scar from surgery  Inspection: No masses, redness, swelling, or asymmetry. No contractures  Functional ROM: Decreased ROM          Functional ROM: Adequate ROM          Muscle strength & Tone: Functionally intact  Muscle strength & Tone: Functionally intact  Sensory: Unimpaired  Sensory: Unimpaired  Palpation: Uncomfortable  Palpation: No palpable anomalies   Assessment  Primary Diagnosis & Pertinent Problem List: The primary encounter diagnosis was Chronic bilateral low back pain without sciatica (Primary Area of Pain) (L>R). 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  Visit Diagnosis: 1. Chronic bilateral low back pain without sciatica (Primary Area of Pain) (L>R)   2. Pain in both hands (Secondary Area of Pain) (L>R)   3. 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 Chronic pain of right knee   5. Chronic pain syndrome   6. Long term current use of opiate analgesic   7. Pharmacologic therapy   8. Disorder of skeletal system   9. Problems influencing health status    Plan of Care  Initial treatment plan:  Please be advised that as per protocol, today's visit has been an evaluation only. We have not taken over the patient's controlled substance management.  Problem-specific plan: No problem-specific Assessment & Plan notes found for this encounter.  Ordered Lab-work, Procedure(s), Referral(s), & Consult(s): Orders Placed This Encounter  Procedures  . DG Lumbar Spine Complete W/Bend  . DG Hand Complete Right  . DG Hand Complete Left  . Comp. Metabolic Panel (12)  . Magnesium  . Vitamin B12  . Sedimentation rate  . 25-Hydroxyvitamin D Lcms D2+D3  . C-reactive protein  . Compliance Drug Analysis, Ur   Pharmacotherapy: Medications ordered:  No orders of the defined types were placed in this encounter.  Medications administered during this visit: Caleb Popp had no medications administered during this visit.   Pharmacotherapy under consideration:  Opioid Analgesics: The patient was informed that there is no guarantee that she would be a candidate for opioid analgesics. The decision will be made following CDC guidelines. This decision will be based on the results of diagnostic studies, as well as Ms. Lycan's risk profile.  Membrane stabilizer: To be determined at a later time Muscle relaxant: To be determined at a later time NSAID: To be determined at a later time Other analgesic(s): To be determined at a later time   Interventional therapies under  consideration: Ms. Olivier was informed that there is no guarantee that she would be a candidate for interventional therapies. The decision will be based on the results of diagnostic studies, as well as Ms. Banas's risk profile.  Possible procedure(s): Under consideration   Provider-requested follow-up: Return for 2nd Visit, w/ Dr. Dossie Arbour.  Future Appointments  Date Time Provider [place]  03/06/2018 10:30 AM Milinda Pointer, MD [facility] None    Primary Care Physician: Sofie Hartigan, MD Location: [facility] Note by:  Date: 02/20/2018; Time: 10:51 PM  Pain Score Disclaimer: We use the NRS-11 scale. This is a self-reported, subjective measurement of pain severity with only modest accuracy. It is used primarily to identify changes within a particular patient. It must be understood that outpatient pain scales are significantly less accurate that those used for research, where they can be applied under ideal controlled circumstances with minimal exposure to variables. In reality, the score is likely to be a combination of pain intensity and pain affect, where pain affect describes the degree of emotional arousal or changes in action readiness caused by the sensory experience of pain. Factors such as social and work situation, setting, emotional state, anxiety levels, expectation, and prior pain experience may influence pain perception and show large inter-individual differences that may also be affected by time variables.  Patient instructions provided during this appointment: Patient Instructions   ____________________________________________________________________________________________  Appointment Policy Summary  It is our goal and responsibility to provide the medical community with assistance in the evaluation and management of patients with chronic pain. Unfortunately our resources are limited. Because we do not have an unlimited amount of  time, or available appointments, we are required to closely monitor and manage their use. The following rules exist to maximize their use:  Patient's responsibilities: 1. Punctuality:  At what time should I arrive? You should be physically present in our office 30 minutes before your scheduled appointment. Your scheduled appointment is with your assigned healthcare provider. However, it takes 5-10 minutes to be "checked-in", and another 15 minutes for the nurses to do the admission. If you arrive to our office at the time you were given for your appointment, you will end up being at least 20-25 minutes late to your appointment with the provider. 2. Tardiness:  What happens if I arrive only a few minutes after my scheduled appointment time? You will need to reschedule your appointment. The cutoff is your appointment time. This is why it is so important that you arrive at least 30 minutes before that appointment. If you have an appointment scheduled for 10:00 AM and you arrive at 10:01, you will be required to reschedule your appointment.  3. Plan ahead:  Always assume that you will encounter traffic on your way in. Plan for it. If you are dependent on a driver, make sure they understand these rules and the need to arrive early. 4. Other appointments and responsibilities:  Avoid scheduling any other appointments before or after your pain clinic appointments.  5. Be prepared:  Write down everything that you need to discuss with your healthcare provider and give this information to the admitting nurse. Write down the medications that you will need refilled. Bring your pills and bottles (even the empty ones), to all of your appointments, except for those where a procedure is scheduled. 6. No children or pets:  Find someone to take care of them. It is not appropriate to bring them in. 7. Scheduling changes:  We request "advanced notification" of any changes or cancellations. 8. Advanced notification:   Defined as a time period of more than 24 hours prior to the originally scheduled appointment. This allows for the appointment to be offered to other patients. 9. Rescheduling:  When a visit is rescheduled, it will require the cancellation of the original appointment. For this reason they both fall within the category of "Cancellations".  10. Cancellations:  They require advanced notification. Any cancellation less than 24 hours before the  appointment  will be recorded as a "No Show". 11. No Show:  Defined as an unkept appointment where the patient failed to notify or declare to the practice their intention or inability to keep the appointment.  Corrective process for repeat offenders:  1. Tardiness: Three (3) episodes of rescheduling due to late arrivals will be recorded as one (1) "No Show". 2. Cancellation or reschedule: Three (3) cancellations or rescheduling will be recorded as one (1) "No Show". 3. "No Shows": Three (3) "No Shows" within a 12 month period will result in discharge from the practice. ____________________________________________________________________________________________   ______________________________________________________________________________________________  Specialty Pain Scale  Introduction:  There are significant differences in how pain is reported. The word pain usually refers to physical pain, but it is also a common synonym of suffering. The medical community uses a scale from 0 (zero) to 10 (ten) to report pain level. Zero (0) is described as "no pain", while ten (10) is described as "the worse pain you can imagine". The problem with this scale is that physical pain is reported along with suffering. Suffering refers to mental pain, or more often yet it refers to any unpleasant feeling, emotion or aversion associated with the perception of harm or threat of harm. It is the psychological component of pain.  Pain Specialists prefer to separate the two  components. The pain scale used by this practice is the Verbal Numerical Rating Scale (VNRS-11). This scale is for the physical pain only. DO NOT INCLUDE how your pain psychologically affects you. This scale is for adults 31 years of age and older. It has 11 (eleven) levels. The 1st level is 0/10. This means: "right now, I have no pain". In the context of pain management, it also means: "right now, my physical pain is under control with the current therapy".  General Information:  The scale should reflect your current level of pain. Unless you are specifically asked for the level of your worst pain, or your average pain. If you are asked for one of these two, then it should be understood that it is over the past 24 hours.  Levels 1 (one) through 5 (five) are described below, and can be treated as an outpatient. Ambulatory pain management facilities such as ours are more than adequate to treat these levels. Levels 6 (six) through 10 (ten) are also described below, however, these must be treated as a hospitalized patient. While levels 6 (six) and 7 (seven) may be evaluated at an urgent care facility, levels 8 (eight) through 10 (ten) constitute medical emergencies and as such, they belong in a hospital's emergency department. When having these levels (as described below), do not come to our office. Our facility is not equipped to manage these levels. Go directly to an urgent care facility or an emergency department to be evaluated.  Definitions:  Activities of Daily Living (ADL): Activities of daily living (ADL or ADLs) is a term used in healthcare to refer to people's daily self-care activities. Health professionals often use a person's ability or inability to perform ADLs as a measurement of their functional status, particularly in regard to people post injury, with disabilities and the elderly. There are two ADL levels: Basic and Instrumental. Basic Activities of Daily Living (BADL  or BADLs) consist of  self-care tasks that include: Bathing and showering; personal hygiene and grooming (including brushing/combing/styling hair); dressing; Toilet hygiene (getting to the toilet, cleaning oneself, and getting back up); eating and self-feeding (not including cooking or chewing and swallowing); functional mobility, often  referred to as "transferring", as measured by the ability to walk, get in and out of bed, and get into and out of a chair; the broader definition (moving from one place to another while performing activities) is useful for people with different physical abilities who are still able to get around independently. Basic ADLs include the things many people do when they get up in the morning and get ready to go out of the house: get out of bed, go to the toilet, bathe, dress, groom, and eat. On the average, loss of function typically follows a particular order. Hygiene is the first to go, followed by loss of toilet use and locomotion. The last to go is the ability to eat. When there is only one remaining area in which the person is independent, there is a 62.9% chance that it is eating and only a 3.5% chance that it is hygiene. Instrumental Activities of Daily Living (IADL or IADLs) are not necessary for fundamental functioning, but they let an individual live independently in a community. IADL consist of tasks that include: cleaning and maintaining the house; home establishment and maintenance; care of others (including selecting and supervising caregivers); care of pets; child rearing; managing money; managing financials (investments, etc.); meal preparation and cleanup; shopping for groceries and necessities; moving within the community; safety procedures and emergency responses; health management and maintenance (taking prescribed medications); and using the telephone or other form of communication.  Instructions:  Most patients tend to report their pain as a combination of two factors, their physical  pain and their psychosocial pain. This last one is also known as "suffering" and it is reflection of how physical pain affects you socially and psychologically. From now on, report them separately.  From this point on, when asked to report your pain level, report only your physical pain. Use the following table for reference.  Pain Clinic Pain Levels (0-5/10)  Pain Level Score  Description  No Pain 0   Mild pain 1 Nagging, annoying, but does not interfere with basic activities of daily living (ADL). Patients are able to eat, bathe, get dressed, toileting (being able to get on and off the toilet and perform personal hygiene functions), transfer (move in and out of bed or a chair without assistance), and maintain continence (able to control bladder and bowel functions). Blood pressure and heart rate are unaffected. A normal heart rate for a healthy adult ranges from 60 to 100 bpm (beats per minute).   Mild to moderate pain 2 Noticeable and distracting. Impossible to hide from other people. More frequent flare-ups. Still possible to adapt and function close to normal. It can be very annoying and may have occasional stronger flare-ups. With discipline, patients may get used to it and adapt.   Moderate pain 3 Interferes significantly with activities of daily living (ADL). It becomes difficult to feed, bathe, get dressed, get on and off the toilet or to perform personal hygiene functions. Difficult to get in and out of bed or a chair without assistance. Very distracting. With effort, it can be ignored when deeply involved in activities.   Moderately severe pain 4 Impossible to ignore for more than a few minutes. With effort, patients may still be able to manage work or participate in some social activities. Very difficult to concentrate. Signs of autonomic nervous system discharge are evident: dilated pupils (mydriasis); mild sweating (diaphoresis); sleep interference. Heart rate becomes elevated (>115 bpm).  Diastolic blood pressure (lower number) rises above 100 mmHg. Patients  find relief in laying down and not moving.   Severe pain 5 Intense and extremely unpleasant. Associated with frowning face and frequent crying. Pain overwhelms the senses.  Ability to do any activity or maintain social relationships becomes significantly limited. Conversation becomes difficult. Pacing back and forth is common, as getting into a comfortable position is nearly impossible. Pain wakes you up from deep sleep. Physical signs will be obvious: pupillary dilation; increased sweating; goosebumps; brisk reflexes; cold, clammy hands and feet; nausea, vomiting or dry heaves; loss of appetite; significant sleep disturbance with inability to fall asleep or to remain asleep. When persistent, significant weight loss is observed due to the complete loss of appetite and sleep deprivation.  Blood pressure and heart rate becomes significantly elevated. Caution: If elevated blood pressure triggers a pounding headache associated with blurred vision, then the patient should immediately seek attention at an urgent or emergency care unit, as these may be signs of an impending stroke.    Emergency Department Pain Levels (6-10/10)  Emergency Room Pain 6 Severely limiting. Requires emergency care and should not be seen or managed at an outpatient pain management facility. Communication becomes difficult and requires great effort. Assistance to reach the emergency department may be required. Facial flushing and profuse sweating along with potentially dangerous increases in heart rate and blood pressure will be evident.   Distressing pain 7 Self-care is very difficult. Assistance is required to transport, or use restroom. Assistance to reach the emergency department will be required. Tasks requiring coordination, such as bathing and getting dressed become very difficult.   Disabling pain 8 Self-care is no longer possible. At this level, pain is  disabling. The individual is unable to do even the most "basic" activities such as walking, eating, bathing, dressing, transferring to a bed, or toileting. Fine motor skills are lost. It is difficult to think clearly.   Incapacitating pain 9 Pain becomes incapacitating. Thought processing is no longer possible. Difficult to remember your own name. Control of movement and coordination are lost.   The worst pain imaginable 10 At this level, most patients pass out from pain. When this level is reached, collapse of the autonomic nervous system occurs, leading to a sudden drop in blood pressure and heart rate. This in turn results in a temporary and dramatic drop in blood flow to the brain, leading to a loss of consciousness. Fainting is one of the body's self defense mechanisms. Passing out puts the brain in a calmed state and causes it to shut down for a while, in order to begin the healing process.    Summary: 1. Refer to this scale when providing [place] with your pain level. 2. Be accurate and careful when reporting your pain level. This will help with your care. 3. Over-reporting your pain level will lead to loss of credibility. 4. Even a level of 1/10 means that there is pain and will be treated at our facility. 5. High, inaccurate reporting will be documented as "Symptom Exaggeration", leading to loss of credibility and suspicions of possible secondary gains such as obtaining more narcotics, or wanting to appear disabled, for fraudulent reasons. 6. Only pain levels of 5 or below will be seen at our facility. 7. Pain levels of 6 and above will be sent to the Emergency Department and the appointment cancelled. ______________________________________________________________________________________________

## 2018-02-20 NOTE — Progress Notes (Signed)
Safety precautions to be maintained throughout the outpatient stay will include: orient to surroundings, keep bed in low position, maintain call bell within reach at all times, provide assistance with transfer out of bed and ambulation.  

## 2018-02-24 LAB — COMPLIANCE DRUG ANALYSIS, UR

## 2018-02-25 LAB — 25-HYDROXY VITAMIN D LCMS D2+D3
25-Hydroxy, Vitamin D-2: <1 ng/mL
25-Hydroxy, Vitamin D-3: 52 ng/mL
25-Hydroxy, Vitamin D: 52 ng/mL

## 2018-02-25 LAB — COMP. METABOLIC PANEL (12)
ALK PHOS: 76 IU/L (ref 39–117)
AST: 23 IU/L (ref 0–40)
Albumin/Globulin Ratio: 2 (ref 1.2–2.2)
Albumin: 4.4 g/dL (ref 3.7–4.7)
BUN/Creatinine Ratio: 22 (ref 12–28)
BUN: 31 mg/dL — ABNORMAL HIGH (ref 8–27)
Bilirubin Total: 0.3 mg/dL (ref 0.0–1.2)
CHLORIDE: 101 mmol/L (ref 96–106)
Calcium: 9.7 mg/dL (ref 8.7–10.3)
Creatinine, Ser: 1.39 mg/dL — ABNORMAL HIGH (ref 0.57–1.00)
GFR calc Af Amer: 43 mL/min/{1.73_m2} — ABNORMAL LOW (ref >59)
GFR calc non Af Amer: 37 mL/min/{1.73_m2} — ABNORMAL LOW (ref >59)
GLOBULIN, TOTAL: 2.2 g/dL (ref 1.5–4.5)
GLUCOSE: 129 mg/dL — AB (ref 65–99)
POTASSIUM: 4.5 mmol/L (ref 3.5–5.2)
SODIUM: 139 mmol/L (ref 134–144)
Total Protein: 6.6 g/dL (ref 6.0–8.5)

## 2018-02-25 LAB — C-REACTIVE PROTEIN: CRP: <1 mg/L (ref 0–10)

## 2018-02-25 LAB — VITAMIN B12: Vitamin B-12: 574 pg/mL (ref 232–1245)

## 2018-02-25 LAB — MAGNESIUM: Magnesium: 1.2 mg/dL — ABNORMAL LOW (ref 1.6–2.3)

## 2018-02-25 LAB — SEDIMENTATION RATE: SED RATE: 8 mm/h (ref 0–40)

## 2018-03-04 NOTE — Progress Notes (Signed)
Patient's Name: Chelsea Ruiz  MRN: [id number]  Referring Provider: Sofie Hartigan, MD  DOB: June 09, 1940  PCP: Sofie Hartigan, MD  DOS: 03/06/2018  Note by: Gaspar Cola, MD  Service setting: Ambulatory outpatient  Specialty: Interventional Pain Management  Location: [facility] (AMB) Pain Management Facility    Patient type: Established   Primary Reason(s) for Visit: Encounter for evaluation before starting new chronic pain management plan of care (Level of risk: moderate) CC: Hand Pain (all fingers); Back Pain (upper, lower); Knee Pain (bilateral); and Neck Pain  HPI  Ms. Chelsea Ruiz is a 78 y.o. year old, female patient, who comes today for a follow-up evaluation to review the test results and decide on a treatment plan. She has Atrioventricular block, second degree; Abnormal ECG; Other secondary scoliosis, lumbar region; Biomechanical lesion, unspecified; Bradycardia, sinus; Chronic kidney disease (CKD), stage III (moderate) ([place]); Chronic pain of multiple joints; Chronic pain of right knee (Fourth Area of Pain); Fibromyalgia; Chronic venous insufficiency; Degenerative arthritis of lumbar spine; Depression; Arthropathic psoriasis, unspecified ([place]); Difficulty walking; Essential hypertension; Fatty (change of) liver, not elsewhere classified; Foot drop, left foot; S/P total knee replacement, right; Hyperlipidemia; Thyroid disease; Lightheadedness; Malfunction of spinal cord stimulator ([place]); Memory loss; Mitral valve prolapse; Nonrheumatic aortic valve stenosis; Osteoarthritis; Other cervical disc degeneration, unspecified cervical region; Other closed fractures of distal end of radius (alone); Pedal edema; Spinal stenosis, lumbar region without neurogenic claudication; Sleep apnea; SOB (shortness of breath) on exertion; Status post placement of cardiac pacemaker; Type 2 diabetes mellitus with stage 3 chronic kidney disease, without long-term current use of insulin ([place]); Unspecified abnormalities  of gait and mobility; Wears hearing aid; Chronic bilateral low back pain without sciatica (Primary Area of Pain) (L>R); Pain in both hands (Secondary Area of Pain) (L>R); Chronic neck pain (Tertiary Area of Pain); Chronic pain syndrome; Long term current use of opiate analgesic; Pharmacologic therapy; Disorder of skeletal system; Problems influencing health status; Hypomagnesemia; and Peripheral neuropathy on their problem list. Her primarily concern today is the Hand Pain (all fingers); Back Pain (upper, lower); Knee Pain (bilateral); and Neck Pain  Pain Assessment: Location: Lower Back Radiating: numbness and tingling in fingers Onset: More than a month ago Duration: Chronic pain Quality: Aching Severity: 7 /10 (subjective, self-reported pain score)  Note: Reported level is inconsistent with clinical observations. Clinically the patient looks like a 3/10 A 3/10 is viewed as "Moderate" and described as significantly interfering with activities of daily living (ADL). It becomes difficult to feed, bathe, get dressed, get on and off the toilet or to perform personal hygiene functions. Difficult to get in and out of bed or a chair without assistance. Very distracting. With effort, it can be ignored when deeply involved in activities. Information on the proper use of the pain scale provided to the patient today. When using our objective Pain Scale, levels between 6 and 10/10 are said to belong in an emergency room, as it progressively worsens from a 6/10, described as severely limiting, requiring emergency care not usually available at an outpatient pain management facility. At a 6/10 level, communication becomes difficult and requires great effort. Assistance to reach the emergency department may be required. Facial flushing and profuse sweating along with potentially dangerous increases in heart rate and blood pressure will be evident. Timing: Intermittent Modifying factors: Oxymorphone, lying down BP:  107/67  HR: 98  Ms. Chelsea Ruiz comes in today for a follow-up visit after her initial evaluation on 02/20/2018. Today we went over the results  of her tests. These were explained in "Layman's terms". During today's appointment we went over my diagnostic impression, as well as the proposed treatment plan.  According to the patient her primary area of pain is in her lower back (B) (L>R).  She is s/p 4 surgeries including a spinal fusion.  She denies any interventional therapy.  She denies any recent images.  Her second area of pain is in her hands (B) (L>R).  She admits that she has numbness and tingling along with weakness.  She denies a previous nerve conduction study or recent images.  Third area pain is in her right knee (B) (R>L).  She is s/p bilateral total knee replacement.  She has had physical therapy which was effective.  She has had recent images.  Fourth area of pain is in her neck (Back) (B) (L>R).  She is s/p cervical fusion. She feels like she had nerve blocks in the past. She has had recent images. She also has (5th) shoulder pain (B) (L>R).  (6th) Feet pain (B) (R>L). Affecting the entire foot. It is pain and also a sensation of vibration.   She is currently at [facility] pain clinic (Dr. Gillis Santa) and desires to come for medication management.  In considering the treatment plan options, [name] was reminded that I no longer take patients for medication management only. I asked her to let me know if she had no intention of taking advantage of the interventional therapies, so that we could make arrangements to provide this space to someone interested. I also made it clear that undergoing interventional therapies for the purpose of getting pain medications is very inappropriate on the part of a patient, and it will not be tolerated in this practice. This type of behavior would suggest true addiction and therefore it requires referral to an addiction specialist.   Further details on  both, my assessment(s), as well as the proposed treatment plan, please see below.  Controlled Substance Pharmacotherapy Assessment REMS (Risk Evaluation and Mitigation Strategy)  Analgesic: Oxymorphone IR '5mg'$  BID (last filled 01/21/2018) (10 mg/day of oxymorphone) Highest recorded MME/day: 120 mg/day MME/day: '30mg'$ /day  Pill Count: None expected due to no prior prescriptions written by our practice. Landis Martins, RN  03/06/2018 10:30 AM  Sign when Signing Visit Safety precautions to be maintained throughout the outpatient stay will include: orient to surroundings, keep bed in low position, maintain call bell within reach at all times, provide assistance with transfer out of bed and ambulation.    Pharmacokinetics: Liberation and absorption (onset of action): WNL Distribution (time to peak effect): WNL Metabolism and excretion (duration of action): WNL         Pharmacodynamics: Desired effects: Analgesia: Ms. Valentine reports >50% benefit. Functional ability: Patient reports that medication allows her to accomplish basic ADLs Clinically meaningful improvement in function (CMIF): Sustained CMIF goals met Perceived effectiveness: Described as relatively effective, allowing for increase in activities of daily living (ADL) Undesirable effects: Side-effects or Adverse reactions: None reported Monitoring: [place] PMP: Online review of the past 21-monthperiod previously conducted. Not applicable at this point since we have not taken over the patient's medication management yet. List of other Serum/Urine Drug Screening Test(s):  No results found. List of all UDS test(s) done:  Lab Results  Component Value Date   SUMMARY FINAL 02/20/2018   Last UDS on record: Summary  Date Value Ref Range Status  02/20/2018 FINAL  Final    Comment:    ==================================================================== TOXASSURE COMP DRUG  ANALYSIS,UR ==================================================================== Test                             Result       Flag       Units Drug Present and Declared for Prescription Verification   Oxymorphone                    839          EXPECTED   ng/mg creat    Sources of oxymorphone include scheduled prescription    medications; it is also an expected metabolite of oxycodone.   Bupropion                      PRESENT      EXPECTED   Hydroxybupropion               PRESENT      EXPECTED    Hydroxybupropion is an expected metabolite of bupropion.   Duloxetine                     PRESENT      EXPECTED Drug Absent but Declared for Prescription Verification   Salicylate                     Not Detected UNEXPECTED    Aspirin, as indicated in the declared medication list, is not    always detected even when used as directed. ==================================================================== Test                      Result    Flag   Units      Ref Range   Creatinine              120              mg/dL      >=20 ==================================================================== Declared Medications:  The flagging and interpretation on this report are based on the  following declared medications.  Unexpected results may arise from  inaccuracies in the declared medications.  **Note: The testing scope of this panel includes these medications:  Bupropion (Wellbutrin)  Duloxetine (Cymbalta)  Oxymorphone  **Note: The testing scope of this panel does not include small to  moderate amounts of these reported medications:  Aspirin (Aspirin 81)  **Note: The testing scope of this panel does not include following  reported medications:  Amoxicillin  Atorvastatin  Donepezil (Aricept)  Eye Drops  Hydrochlorothiazide  Levothyroxine (Synthroid)  Lisinopril  Metformin  Multivitamin  Pioglitazone ==================================================================== For clinical  consultation, please call [phone]. ====================================================================    UDS interpretation: No unexpected findings.          Medication Assessment Form: Patient introduced to form today Treatment compliance: Treatment may start today if patient agrees with proposed plan. Evaluation of compliance is not applicable at this point Risk Assessment Profile: Aberrant behavior: See initial evaluations. None observed or detected today Comorbid factors increasing risk of overdose: See initial evaluation. No additional risks detected today Opioid risk tool (ORT):  Opioid Risk  02/20/2018  Alcohol 0  Illegal Drugs 2  Rx Drugs 0  Alcohol 0  Illegal Drugs 0  Rx Drugs 0  Age between 16-45 years  0  Psychological Disease 2  ADD Negative  OCD Negative  Bipolar Negative  Depression 1  Opioid Risk Tool Scoring 5  Opioid Risk Interpretation Moderate Risk  ORT Scoring interpretation table:  Score <3 = Low Risk for SUD  Score between 4-7 = Moderate Risk for SUD  Score >8 = High Risk for Opioid Abuse   Risk of substance use disorder (SUD): Low  Risk Mitigation Strategies:  Patient opioid safety counseling: Not applicable. Patient-Prescriber Agreement (PPA): No agreement signed.  Controlled substance notification to other providers: Not applicable  Pharmacologic Plan: The patient does have confirmed pathology for which the use of opioid analgesics may be justified. She is not interested interventional therapies, only on medications. Unfortunately, we do not have the necessary resources to take on her case for medication management only.  Laboratory Chemistry  Inflammation Markers (CRP: Acute Phase) (ESR: Chronic Phase) Lab Results  Component Value Date   CRP <1 02/20/2018   ESRSEDRATE 8 02/20/2018                         Rheumatology Markers No results found.  Renal Function Markers Lab Results  Component Value Date   BUN 31 (H) 02/20/2018    CREATININE 1.39 (H) 02/20/2018   BCR 22 02/20/2018   GFRAA 43 (L) 02/20/2018   GFRNONAA 37 (L) 02/20/2018                             Hepatic Function Markers Lab Results  Component Value Date   AST 23 02/20/2018   ALBUMIN 4.4 02/20/2018   ALKPHOS 76 02/20/2018                        Electrolytes Lab Results  Component Value Date   NA 139 02/20/2018   K 4.5 02/20/2018   CL 101 02/20/2018   CALCIUM 9.7 02/20/2018   MG 1.2 (L) 02/20/2018                        Neuropathy Markers Lab Results  Component Value Date   [id number] 732 02/20/2018                        CNS Tests No results found.  Bone Pathology Markers Lab Results  Component Value Date   25OHVITD1 52 02/20/2018   25OHVITD2 <1.0 02/20/2018   25OHVITD3 52 02/20/2018                         Coagulation Parameters Lab Results  Component Value Date   INR 0.95 11/13/2016   LABPROT 12.6 11/13/2016   APTT 32 11/13/2016   PLT 225 11/13/2016                        Cardiovascular Markers Lab Results  Component Value Date   HGB 11.9 (L) 11/13/2016   HCT 36.3 11/13/2016                         CA Markers No results found.  Endocrine Markers No results found.  Note: Lab results reviewed and explained to patient in Layman's terms.  Recent Diagnostic Imaging Review  Cervical Imaging: Cervical CT wo contrast:  Results for orders placed during the hospital encounter of 11/13/17  CT Cervical Spine Wo Contrast   Narrative CLINICAL DATA:  Posttraumatic headache after fall off scooter. Positive loss of consciousness.  EXAM: CT HEAD WITHOUT CONTRAST  CT CERVICAL SPINE  WITHOUT CONTRAST  TECHNIQUE: Multidetector CT imaging of the head and cervical spine was performed following the standard protocol without intravenous contrast. Multiplanar CT image reconstructions of the cervical spine were also generated.  COMPARISON:  CT scan of February 27, 2014.  FINDINGS: CT HEAD FINDINGS  Brain: Mild  chronic ischemic white matter disease is noted. No mass effect or midline shift is noted. Ventricular size is within normal limits. There is no evidence of mass lesion, hemorrhage or acute infarction.  Vascular: No hyperdense vessel or unexpected calcification.  Skull: Normal. Negative for fracture or focal lesion.  Sinuses/Orbits: No acute finding.  Other: None.  CT CERVICAL SPINE FINDINGS  Alignment: Grade 1 anterolisthesis is noted of C3-4 and C4-5 secondary to posterior facet joint hypertrophy.  Skull base and vertebrae: No acute fracture. No primary bone lesion or focal pathologic process.  Soft tissues and spinal canal: No prevertebral fluid or swelling. No visible canal hematoma.  Disc levels: Severe degenerative disc disease is noted at C4-5, C5-6, C6-7 and C7-T1.  Upper chest: Negative.  Other: Degenerative changes are seen involving posterior facet joints bilaterally.  IMPRESSION: Mild chronic ischemic white matter disease. No acute intracranial abnormality seen.  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 No fracture or other acute abnormality is noted.   Electronically Signed   By: Marijo Conception, M.D.   On: 11/13/2017 21:38    Knee Imaging: Knee-R DG 4 views:  Results for orders placed during the hospital encounter of 11/13/17  DG Knee Complete 4 Views Right   Narrative CLINICAL DATA:  Status post fall off scooter, with acute onset of right knee erythema. Initial encounter.  EXAM: RIGHT KNEE - COMPLETE 4+ VIEW  COMPARISON:  None.  FINDINGS: There is no evidence of fracture or dislocation. The joint spaces are preserved. 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 No significant degenerative change is seen.  No significant joint effusion is seen. The visualized soft tissues are normal in appearance.  IMPRESSION: No evidence of fracture or dislocation. Total  knee arthroplasty appears grossly intact, without evidence of loosening.   Electronically Signed   By: Garald Balding M.D.   On: 11/13/2017 21:26    Complexity Note: Imaging results reviewed. Results shared with Ms. Olivero, using Layman's terms.                         Meds   Current Outpatient Medications:  .  amoxicillin (AMOXIL) 500 MG capsule, Take 2,000 mg See admin instructions by mouth. Take 2000 mg by mouth 1 hour prior to dental appointment, Disp: , Rfl:  .  aspirin EC 81 MG tablet, Take 81 mg daily by mouth., Disp: , Rfl:  .  atorvastatin (LIPITOR) 80 MG tablet, Take 40 mg daily by mouth., Disp: , Rfl: 1 .  buPROPion (WELLBUTRIN XL) 150 MG 24 hr tablet, Take 150 mg daily by mouth., Disp: , Rfl: 3 .  donepezil (ARICEPT) 5 MG tablet, Take 5 mg by mouth daily., Disp: , Rfl:  .  DULoxetine (CYMBALTA) 60 MG capsule, Take 60 mg daily by mouth., Disp: , Rfl: 3 .  hydrochlorothiazide (HYDRODIURIL) 12.5 MG tablet, Take 12.5 mg daily by mouth., Disp: , Rfl: 6 .  levothyroxine (SYNTHROID, LEVOTHROID) 75 MCG tablet, Take 75 mcg daily by mouth., Disp: , Rfl: 3 .  lisinopril (PRINIVIL,ZESTRIL) 30 MG tablet, Take 30 mg daily by mouth., Disp: , Rfl: 6 .  metFORMIN (GLUCOPHAGE) 500 MG tablet, Take 500 mg 2 (two) times daily by mouth., Disp: , Rfl: 0 .  Multiple Vitamins-Minerals (MULTIVITAMIN PO), Take 1 tablet daily by mouth., Disp: , Rfl:  .  oxymorphone (OPANA) 5 MG tablet, Take 5 mg every 8 (eight) hours as needed by mouth for pain. , Disp: , Rfl: 0 .  pioglitazone (ACTOS) 15 MG tablet, Take 15 mg daily by mouth., Disp: , Rfl: 6 .  vitamin B-12 (CYANOCOBALAMIN) 100 MCG tablet, Take 100 mcg by mouth daily., Disp: , Rfl:  .  Magnesium 500 MG CAPS, Take 1 capsule (500 mg total) by mouth daily., Disp: 30 capsule, Rfl: 5  ROS  Constitutional: Denies any fever or chills Gastrointestinal: No reported hemesis, hematochezia, vomiting, or acute GI distress Musculoskeletal: Denies any acute onset  joint swelling, redness, loss of ROM, or weakness Neurological: No reported episodes of acute onset apraxia, aphasia, dysarthria, agnosia, amnesia, paralysis, loss of coordination, or loss of consciousness  Allergies  Ms. Mccravy is allergic to methadone; morphine and related; and oxycodone.  PFSH  Drug: Ms. Gengler  reports no history of drug use. Alcohol:  reports no history of alcohol use. Tobacco:  reports that she has never smoked. She has never used smokeless tobacco. Medical:  has a past medical history of Depression, Diabetes mellitus without complication ([place]), Dyspnea, Dysrhythmia, Elevated lipids, Fatty liver, Hypertension, Hypothyroidism, Sleep apnea, and Tremors of nervous system. Surgical: Ms. Matsushita  has a past surgical history that includes Back surgery; Tear duct probing; total knee (Bilateral); and Pacemaker insertion (N/A, 11/16/2016). Family: family history is not on file.  Constitutional Exam  General appearance: Well nourished, well developed, and well hydrated. In no apparent acute distress Vitals:   03/06/18 1024  BP: 107/67  Pulse: 98  Resp: 16  Temp: 98.2 F (36.8 C)  TempSrc: Oral  SpO2: 99%  Weight: 184 lb (83.5 kg)  Height: '4\' 11"'$  (1.499 m)   BMI Assessment: Estimated body mass index is 37.16 kg/m as calculated from the following:   Height as of this encounter: '4\' 11"'$  (1.499 m).   Weight as of this encounter: 184 lb (83.5 kg).  BMI interpretation table: BMI level Category Range association with higher incidence of chronic pain  <18 kg/m2 Underweight   18.5-24.9 kg/m2 Ideal body weight   25-29.9 kg/m2 Overweight Increased incidence by 20%  30-34.9 kg/m2 Obese (Class I) Increased incidence by 68%  35-39.9 kg/m2 Severe obesity (Class II) Increased incidence by 136%  >40 kg/m2 Extreme obesity (Class III) Increased incidence by 254%   Patient's current BMI Ideal Body weight  Body mass index is 37.16 kg/m. Patient must be at least 60 in tall to calculate  ideal body weight   BMI Readings from Last 4 Encounters:  03/06/18 37.16 kg/m  02/20/18 37.16 kg/m  11/16/16 34.34 kg/m  11/13/16 34.34 kg/m   Wt Readings from Last 4 Encounters:  03/06/18 184 lb (83.5 kg)  02/20/18 184 lb (83.5 kg)  11/16/16 170 lb (77.1 kg)  11/13/16 170 lb (77.1 kg)  Psych/Mental status: Alert, oriented x 3 (person, place, & time)       Eyes: PERLA Respiratory: No evidence of acute respiratory distress  Gait & Posture Assessment  Ambulation: Patient came in today in a wheel chair Gait: Significantly limited. Dependent on assistive device to ambulate Posture: WNL   Assessment & Plan  Primary Diagnosis & Pertinent Problem List: The primary encounter diagnosis was Chronic kidney disease (CKD), stage III (moderate) ([place]). Diagnoses  of Hypomagnesemia, Peripheral polyneuropathy, Pain in both hands (Secondary Area of Pain) (L>R), and Chronic bilateral low back pain without sciatica (Primary Area of Pain) (L>R) were also pertinent to this visit.  Visit Diagnosis: 1. Chronic kidney disease (CKD), stage III (moderate) ([facility]CC)   2. Hypomagnesemia   3. Peripheral polyneuropathy   4. Pain in both hands (Secondary Area of Pain) (L>R)   5. Chronic bilateral low back pain without sciatica (Primary Area of Pain) (L>R)    Problems updated and reviewed during this visit: Problem  Peripheral Neuropathy  Degenerative Arthritis of Lumbar Spine  Chronic bilateral low back pain without sciatica (Primary Area of Pain) (L>R)  Pain in both hands (Secondary Area of Pain) (L>R)  Chronic neck pain (Tertiary Area of Pain)  Chronic Pain Syndrome  Pedal Edema  S/P Total Knee Replacement, Right  Chronic pain of right knee (Fourth Area of Pain)  Malfunction of Spinal Cord Stimulator ([facility])  Chronic Pain of Multiple Joints  Difficulty Walking  Foot Drop, Left Foot  Unspecified Abnormalities of Gait and Mobility  Other Closed Fractures of Distal End of Radius (Alone)  Biomechanical  Lesion, Unspecified  Other Secondary Scoliosis, Lumbar Region  Spinal Stenosis, Lumbar Region Without Neurogenic Claudication  Fibromyalgia  Other Cervical Disc Degeneration, Unspecified Cervical Region  Arthropathic Psoriasis, Unspecified ([facility])   Overview:  Overview:  a. Gold therapy eventually discontinued 1990 due to lack of               response.        b. Methotrexate therapy 11-90 to 02-92, discontinued due to oral               ulcers and inadequate benefit; re-initiated 08-97 due to flare.        c. History of Sulfasalazine therapy.        d. Imuran therapy.    Overview:  a. Gold therapy eventually discontinued 1990 due to lack of               response.        b. Methotrexate therapy 11-90 to 02-92, discontinued due to oral               ulcers and inadequate benefit; re-initiated 08-97 due to flare.        c. History of Sulfasalazine therapy.        d. Imuran therapy.      Osteoarthritis  Hypomagnesemia  Long Term Current Use of Opiate Analgesic  Pharmacologic Therapy  Disorder of Skeletal System  Problems Influencing Health Status  Memory Loss  Status Post Placement of Cardiac Pacemaker  Bradycardia, Sinus  Atrioventricular Block, Second Degree  Nonrheumatic Aortic Valve Stenosis  Sleep Apnea  Wears Hearing Aid   Overview:  right   Fatty (Change Of) Liver, Not Elsewhere Classified   Overview:  Overview:  Dr Vira Agar Overview:  Dr Vira Agar   Chronic Kidney Disease (Ckd), Stage III (Moderate) ([facility])   Overview:  Overview:  eGFR 50, normal urine MA 04/2011 Overview:  eGFR 50, normal urine MA 04/2011   Mitral Valve Prolapse  Lightheadedness  Abnormal Ecg  Sob (Shortness of Breath) On Exertion  Essential Hypertension  Hyperlipidemia   Overview:  Overview:  LDL 91 04/2011 Overview:  LDL 91 04/2011   Thyroid Disease  Type 2 Diabetes Mellitus With Stage 3 Chronic Kidney Disease, Without Long-Term Current Use of Insulin ([facility])  Depression  Chronic Venous  Insufficiency    Plan of Care  Pharmacotherapy (Medications  Ordered): Meds ordered this encounter  Medications  . Magnesium 500 MG CAPS    Sig: Take 1 capsule (500 mg total) by mouth daily.    Dispense:  30 capsule    Refill:  5    Do not place medication on "Automatic Refill".  The patient may use similar over-the-counter product.   Procedure Orders    No procedure(s) ordered today   Lab Orders  No laboratory test(s) ordered today   Imaging Orders  No imaging studies ordered today    Referral Orders     Ambulatory referral to Nephrology  Pharmacological management options:  Opioid Analgesics: I will not be prescribing any opioids at this time Membrane stabilizer: I will not be prescribing any at this time Muscle relaxant: I will not be prescribing any at this time NSAID: I will not be prescribing any at this time Other analgesic(s): I will not be prescribing any at this time   Interventional management options: Planned, scheduled, and/or pending:    In view of the patient's abnormal findings on her renal labs, and the fact that they indicated that she has never seen a nephrologist, I have entered a referral to nephrology for evaluation and treatment. Since our labs demonstrate that the patient has hypomagnesemia, I have prescribed some magnesium for her to take, but I have explained that he has to be careful because of the renal impairment, which would be a reason for the magnesium to accumulate.  I make sure that her daughter, a nurse, heard and understood the explanation. Because the patient has diabetes and complains about bilateral hand and feet numbness, I have ordered nerve conduction test of the upper and lower extremities to evaluate the possibility of a diabetic peripheral neuropathy versus some other type of polyneuropathy. I have again entered the x-rays that were ordered by Dionisio David, NP, on the initial evaluation, but were actually not done.  The patient  indicated that on the day of the initial evaluation she was not told that she needed to have any x-rays done the twice she did not do them.  In reviewing the after visit summary for that visit, it is clear that there is no mention of these x-rays.  However the note from Ms. Edison Pace does have that the orders were entered.   Considering:   Not interested in interventional therapies.   PRN Procedures:   None at this time   Provider-requested follow-up: No follow-ups on file.  No future appointments.  Primary Care Physician: Sofie Hartigan, MD Location: [facility] Note by: Gaspar Cola, MD Date: 03/06/2018; Time: 12:51 PM

## 2018-03-06 ENCOUNTER — Other Ambulatory Visit: Payer: Self-pay | Admitting: Nurse Practitioner

## 2018-03-06 ENCOUNTER — Encounter: Payer: Self-pay | Admitting: Pain Medicine

## 2018-03-06 ENCOUNTER — Other Ambulatory Visit: Payer: Self-pay

## 2018-03-06 ENCOUNTER — Ambulatory Visit: Payer: Medicare Other | Attending: Pain Medicine | Admitting: Pain Medicine

## 2018-03-06 VITALS — BP 107/67 | HR 98 | Temp 98.2°F | Resp 16 | Ht 59.0 in | Wt 184.0 lb

## 2018-03-06 DIAGNOSIS — M545 Low back pain: Secondary | ICD-10-CM | POA: Insufficient documentation

## 2018-03-06 DIAGNOSIS — N183 Chronic kidney disease, stage 3 unspecified: Secondary | ICD-10-CM

## 2018-03-06 DIAGNOSIS — M79641 Pain in right hand: Secondary | ICD-10-CM

## 2018-03-06 DIAGNOSIS — M79642 Pain in left hand: Secondary | ICD-10-CM | POA: Diagnosis present

## 2018-03-06 DIAGNOSIS — G8929 Other chronic pain: Secondary | ICD-10-CM

## 2018-03-06 DIAGNOSIS — G629 Polyneuropathy, unspecified: Secondary | ICD-10-CM

## 2018-03-06 MED ORDER — MAGNESIUM 500 MG PO CAPS: 500.0000 mg | ORAL_CAPSULE | Freq: Every day | ORAL | 5 refills | Status: AC

## 2018-03-06 NOTE — Progress Notes (Signed)
Safety precautions to be maintained throughout the outpatient stay will include: orient to surroundings, keep bed in low position, maintain call bell within reach at all times, provide assistance with transfer out of bed and ambulation.  

## 2018-04-25 ENCOUNTER — Emergency Department: Payer: Medicare Other

## 2018-04-25 ENCOUNTER — Emergency Department
Admission: EM | Admit: 2018-04-25 | Discharge: 2018-04-25 | Disposition: A | Discharge status: Home / Self Care | Payer: Medicare Other | Attending: Emergency Medicine | Admitting: Emergency Medicine

## 2018-04-25 ENCOUNTER — Other Ambulatory Visit: Payer: Self-pay

## 2018-04-25 DIAGNOSIS — Z7982 Long term (current) use of aspirin: Secondary | ICD-10-CM | POA: Diagnosis not present

## 2018-04-25 DIAGNOSIS — R079 Chest pain, unspecified: Secondary | ICD-10-CM | POA: Insufficient documentation

## 2018-04-25 DIAGNOSIS — R410 Disorientation, unspecified: Secondary | ICD-10-CM | POA: Diagnosis not present

## 2018-04-25 DIAGNOSIS — R4789 Other speech disturbances: Secondary | ICD-10-CM | POA: Diagnosis present

## 2018-04-25 DIAGNOSIS — R51 Headache: Secondary | ICD-10-CM | POA: Insufficient documentation

## 2018-04-25 DIAGNOSIS — Z79899 Other long term (current) drug therapy: Secondary | ICD-10-CM | POA: Diagnosis not present

## 2018-04-25 DIAGNOSIS — N390 Urinary tract infection, site not specified: Secondary | ICD-10-CM | POA: Insufficient documentation

## 2018-04-25 DIAGNOSIS — I10 Essential (primary) hypertension: Secondary | ICD-10-CM | POA: Diagnosis not present

## 2018-04-25 DIAGNOSIS — E119 Type 2 diabetes mellitus without complications: Secondary | ICD-10-CM | POA: Insufficient documentation

## 2018-04-25 LAB — URINALYSIS, COMPLETE (UACMP) WITH MICROSCOPIC
Bilirubin Urine: NEGATIVE
Glucose, UA: NEGATIVE mg/dL
Hgb urine dipstick: NEGATIVE
Ketones, ur: NEGATIVE mg/dL
Nitrite: NEGATIVE
Protein, ur: NEGATIVE mg/dL
Specific Gravity, Urine: 1.017 (ref 1.005–1.030)
pH: 6 (ref 5.0–8.0)

## 2018-04-25 LAB — CBC WITH DIFFERENTIAL/PLATELET
Abs Immature Granulocytes: 0.02 10*3/uL (ref 0.00–0.07)
Basophils Absolute: 0.1 10*3/uL (ref 0.0–0.1)
Basophils Relative: 1 %
Eosinophils Absolute: 0.1 10*3/uL (ref 0.0–0.5)
Eosinophils Relative: 2 %
HCT: 35 % — ABNORMAL LOW (ref 36.0–46.0)
Hemoglobin: 11.2 g/dL — ABNORMAL LOW (ref 12.0–15.0)
Immature Granulocytes: 0 %
Lymphocytes Relative: 28 %
Lymphs Abs: 1.6 10*3/uL (ref 0.7–4.0)
MCH: 29.9 pg (ref 26.0–34.0)
MCHC: 32 g/dL (ref 30.0–36.0)
MCV: 93.3 fL (ref 80.0–100.0)
Monocytes Absolute: 0.4 10*3/uL (ref 0.1–1.0)
Monocytes Relative: 6 %
Neutro Abs: 3.6 10*3/uL (ref 1.7–7.7)
Neutrophils Relative %: 63 %
Platelets: 227 10*3/uL (ref 150–400)
RBC: 3.75 MIL/uL — ABNORMAL LOW (ref 3.87–5.11)
RDW: 14.3 % (ref 11.5–15.5)
WBC: 5.8 10*3/uL (ref 4.0–10.5)
nRBC: 0 % (ref 0.0–0.2)

## 2018-04-25 LAB — GLUCOSE, CAPILLARY: Glucose-Capillary: 89 mg/dL (ref 70–99)

## 2018-04-25 LAB — COMPREHENSIVE METABOLIC PANEL
ALT: 22 U/L (ref 0–44)
AST: 29 U/L (ref 15–41)
Albumin: 3.8 g/dL (ref 3.5–5.0)
Alkaline Phosphatase: 68 U/L (ref 38–126)
Anion gap: 8 (ref 5–15)
BUN: 30 mg/dL — ABNORMAL HIGH (ref 8–23)
CO2: 28 mmol/L (ref 22–32)
Calcium: 9.2 mg/dL (ref 8.9–10.3)
Chloride: 104 mmol/L (ref 98–111)
Creatinine, Ser: 1.26 mg/dL — ABNORMAL HIGH (ref 0.44–1.00)
GFR calc Af Amer: 48 mL/min — ABNORMAL LOW (ref >60)
GFR calc non Af Amer: 42 mL/min — ABNORMAL LOW (ref >60)
Glucose, Bld: 114 mg/dL — ABNORMAL HIGH (ref 70–99)
Potassium: 4.5 mmol/L (ref 3.5–5.1)
Sodium: 140 mmol/L (ref 135–145)
Total Bilirubin: 0.6 mg/dL (ref 0.3–1.2)
Total Protein: 6.9 g/dL (ref 6.5–8.1)

## 2018-04-25 LAB — PROTIME-INR
INR: 1 (ref 0.8–1.2)
Prothrombin Time: 12.9 seconds (ref 11.4–15.2)

## 2018-04-25 LAB — BRAIN NATRIURETIC PEPTIDE: B Natriuretic Peptide: 56 pg/mL (ref 0.0–100.0)

## 2018-04-25 LAB — APTT: aPTT: 37 seconds — ABNORMAL HIGH (ref 24–36)

## 2018-04-25 LAB — TROPONIN I: Troponin I: <0.03 ng/mL (ref <0.03)

## 2018-04-25 MED ORDER — SODIUM CHLORIDE 0.9 % IV SOLN: 1.0000 g | Freq: Once | INTRAVENOUS | Status: DC

## 2018-04-25 NOTE — Discharge Instructions (Addendum)
Follow-up with your regular doctor.  Return to the emergency department if worsening.  Decrease the Lamictal to 25 twice daily.  Follow-up with Dr. Malvin Johns concerning the Lamictal and gabapentin.

## 2018-04-25 NOTE — ED Notes (Signed)
Patient's daughter Tresa Endo informed of patient's test results and discharge plan. Daughter gave verbal permission for discharge. Patient taken to lobby in wheelchair.

## 2018-04-25 NOTE — ED Notes (Signed)
Patient transported to CT 

## 2018-04-25 NOTE — Consult Note (Signed)
[facility] [place] [facility]   PATIENT NAME: Chelsea Ruiz    MR#:  [id number]  DATE OF BIRTH:  Aug 20, 1940  DATE OF ADMISSION:  04/25/2018  PRIMARY CARE PHYSICIAN: Marina Goodell, MD   CONSULT REQUESTING/REFERRING PHYSICIAN: Dr. Roxan Hockey  REASON FOR CONSULT: Confusion, pain  CHIEF COMPLAINT:   Chief Complaint  Patient presents with  . Altered Mental Status  . Headache    HISTORY OF PRESENT ILLNESS:  Chelsea Ruiz  is a 78 y.o. female with a known history of dementia, hypertension, hypothyroidism, diabetes, chronic pain presents to the emergency room sent in by daughter after patient was noticed to be intermittently more confused and complaining of pain.  Patient follows at the pain clinic and is on opioid pain medications.  Initially she had complained of headache at home.  Here she mentioned chest pain to the PA who saw the patient.  Presently when I asked her she tells me she has pain in her shoulders back neck and head which are chronic.  She is alert and oriented.  No she is in [place], it is April 2020 and tells me her neurologist name.  Patient recently saw her neurologist and was diagnosed with mild dementia.  Also started on Lamictal 50 mg twice daily since when daughter thinks she has worsened some.  Neuropathy started and has helped with pain. There is concern for UTI but patient has no dysuria or hematuria or fever or elevated WBC.  Rare bacteria and only 5 WBC in urine.  PAST MEDICAL HISTORY:   Past Medical History:  Diagnosis Date  . Depression   . Diabetes mellitus without complication (HCC)   . Dyspnea   . Dysrhythmia   . Elevated lipids   . Fatty liver   . Hypertension   . Hypothyroidism   . Sleep apnea   . Tremors of nervous system     PAST SURGICAL HISTOIRY:   Past Surgical History:  Procedure Laterality Date  . BACK SURGERY    . PACEMAKER INSERTION N/A 11/16/2016   Procedure: INSERTION PACEMAKER;  Surgeon: Marcina Millard, MD;  Location: [facility] ORS;  Service: Cardiovascular;  Laterality: N/A;  . TEAR DUCT PROBING    . total knee Bilateral     SOCIAL HISTORY:   Social History   Tobacco Use  . Smoking status: Never Smoker  . Smokeless tobacco: Never Used  Substance Use Topics  . Alcohol use: No    Frequency: Never    FAMILY HISTORY:  History reviewed. No pertinent family history. Family history unobtainable due to dementia  DRUG ALLERGIES:   Allergies  Allergen Reactions  . Methadone Other (See Comments)    Hallucinations and memory  . Morphine And Related Other (See Comments)    Abnormal thinking and memory loss  . Oxycodone Other (See Comments)    Hallucinations and memory    REVIEW OF SYSTEMS:   Review of Systems  Constitutional: Positive for malaise/fatigue. Negative for chills and fever.  HENT: Negative for sore throat.   Eyes: Negative for blurred vision, double vision and pain.  Respiratory: Negative for cough, hemoptysis, shortness of breath and wheezing.   Cardiovascular: Negative for chest pain, palpitations, orthopnea and leg swelling.  Gastrointestinal: Negative for abdominal pain, constipation, diarrhea, heartburn, nausea and vomiting.  Genitourinary: Negative for dysuria and hematuria.  Musculoskeletal: Positive for back pain and joint pain.  Skin: Negative for rash.  Neurological: Negative for sensory change, speech change, focal weakness and headaches.  Endo/Heme/Allergies:  Does not bruise/bleed easily.  Psychiatric/Behavioral: Positive for memory loss. Negative for depression. The patient is not nervous/anxious.     MEDICATIONS AT HOME:   Prior to Admission medications   Medication Sig Start Date End Date Taking? Authorizing Provider  amoxicillin (AMOXIL) 500 MG capsule Take 2,000 mg See admin instructions by mouth. Take 2000 mg by mouth 1 hour prior to dental appointment    [provider]  aspirin EC 81 MG tablet Take 81 mg daily by mouth.     [provider]  atorvastatin (LIPITOR) 80 MG tablet Take 40 mg daily by mouth. 09/30/16   [provider]  buPROPion (WELLBUTRIN XL) 150 MG 24 hr tablet Take 150 mg daily by mouth. 11/04/16   [provider]  donepezil (ARICEPT) 5 MG tablet Take 5 mg by mouth daily. 12/05/17   [provider]  DULoxetine (CYMBALTA) 60 MG capsule Take 60 mg daily by mouth. 10/09/16   [provider]  hydrochlorothiazide (HYDRODIURIL) 12.5 MG tablet Take 12.5 mg daily by mouth. 10/23/16   [provider]  levothyroxine (SYNTHROID, LEVOTHROID) 75 MCG tablet Take 75 mcg daily by mouth. 08/28/16   [provider]  lisinopril (PRINIVIL,ZESTRIL) 30 MG tablet Take 30 mg daily by mouth. 10/23/16   [provider]  Magnesium 500 MG CAPS Take 1 capsule (500 mg total) by mouth daily. 03/06/18 09/02/18  Delano Metz, MD  metFORMIN (GLUCOPHAGE) 500 MG tablet Take 500 mg 2 (two) times daily by mouth. 10/10/16   [provider]  Multiple Vitamins-Minerals (MULTIVITAMIN PO) Take 1 tablet daily by mouth.    [provider]  oxymorphone (OPANA) 5 MG tablet Take 5 mg every 8 (eight) hours as needed by mouth for pain.  10/25/16   [provider]  pioglitazone (ACTOS) 15 MG tablet Take 15 mg daily by mouth. 10/23/16   [provider]  vitamin B-12 (CYANOCOBALAMIN) 100 MCG tablet Take 100 mcg by mouth daily.    [provider]      VITAL SIGNS:  Blood pressure 133/75, pulse 84, temperature 98.4 F (36.9 C), temperature source Oral, resp. rate (!) 21, height  (1.575 m), weight 88.9 kg, SpO2 100 %.  PHYSICAL EXAMINATION:  GENERAL:  78 y.o.-year-old patient lying lying in the bed with no acute distress.  Obese  EYES: Pupils equal, round, reactive to light and accommodation. No scleral icterus. Extraocular muscles intact.  HEENT: Head atraumatic, normocephalic. Oropharynx and nasopharynx clear.  NECK:  Supple, no jugular  venous distention. No thyroid enlargement, no tenderness.  LUNGS: Normal breath sounds bilaterally, no wheezing, rales,rhonchi or crepitation. No use of accessory muscles of respiration.  CARDIOVASCULAR: S1, S2 normal. No murmurs, rubs, or gallops.  ABDOMEN: Soft, nontender, nondistended. Bowel sounds present. No organomegaly or mass.  EXTREMITIES: No pedal edema, cyanosis, or clubbing.  NEUROLOGIC: Cranial nerves II through XII are intact. Muscle strength 5/5 in all extremities. Sensation intact. Gait not checked.  PSYCHIATRIC: The patient is alert and oriented SKIN: No obvious rash, lesion, or ulcer.   LABORATORY PANEL:   CBC Recent Labs  Lab 04/25/18 1402  WBC 5.8  HGB 11.2*  HCT 35.0*  PLT 227   ------------------------------------------------------------------------------------------------------------------  Chemistries  Recent Labs  Lab 04/25/18 1402  NA 140  K 4.5  CL 104  CO2 28  GLUCOSE 114*  BUN 30*  CREATININE 1.26*  CALCIUM 9.2  AST 29  ALT 22  ALKPHOS 68  BILITOT 0.6   ------------------------------------------------------------------------------------------------------------------  Cardiac Enzymes Recent  Labs  Lab 04/25/18 1402  TROPONINI <0.03   ------------------------------------------------------------------------------------------------------------------  RADIOLOGY:  Ct Head Wo Contrast  Result Date: 04/25/2018 CLINICAL DATA:  78 year old female with altered mental status EXAM: CT HEAD WITHOUT CONTRAST TECHNIQUE: Contiguous axial images were obtained from the base of the skull through the vertex without intravenous contrast. COMPARISON:  11/13/2017, 02/27/2014 FINDINGS: Brain: No acute intracranial hemorrhage. No midline shift or mass effect. Gray-white differentiation maintained. Patchy hypodensity in the bilateral periventricular white matter, similar prior. Unremarkable appearance of the ventricular system. Vascular: Calcifications of the  intracranial vasculature. Skull: No acute fracture.  No aggressive bone lesion identified. Sinuses/Orbits: Unremarkable appearance of the orbits. Mastoid air cells clear. No middle ear effusion. No significant sinus disease. Other: None IMPRESSION: Negative for acute intracranial abnormality. Chronic microvascular ischemic disease. Electronically Signed   By: Gilmer MorJaime  Wagner D.O.   On: 04/25/2018 14:06   Dg Chest Port 1 View  Result Date: 04/25/2018 CLINICAL DATA:  78 year old female with headache, confusion and intermittent slurred speech EXAM: PORTABLE CHEST 1 VIEW COMPARISON:  Prior chest x-ray 11/16/2016 FINDINGS: Left subclavian approach cardiac rhythm maintenance device remains in stable position. Leads project over the right atrium and right ventricle. The heart remains within normal limits for size. Unremarkable mediastinal contours. Slightly low inspiratory volumes. The lungs are otherwise clear. No acute osseous abnormality. IMPRESSION: No active disease. Electronically Signed   By: Malachy MoanHeath  McCullough M.D.   On: 04/25/2018 15:34    EKG:   Orders placed or performed during the hospital encounter of 04/25/18  . ED EKG  . ED EKG  . EKG 12-Lead  . EKG 12-Lead    IMPRESSION AND PLAN:   *Dementia with intermittent confusion.  This seems to be a chronic problem which is slowly worsening.  Discussed with daughter regarding no findings of infection or acute changes on CT scan of the head found.  No UTI.  Although has red bacteria she does not have significant WBC.  She is afebrile.  No urinary symptoms.  At this time we will cut the dose of Lamictal from 50 mg twice daily to 25 mg twice daily and have patient follow with a neurologist.  May need follow-up with psychiatry as outpatient for depression if no improvement.  Continue Neurontin.  No other change to medications.  Discussed with ED physician and PA. Discussed with daughter regarding patient likely getting worse confusion with inpatient  delirium if admitted to the hospital.  Patient wishes to be discharged home and daughter agrees.  *Bacteriuria.  No WBC.  No signs of UTI.  Advised daughter to watch for any fever, abdominal pain, dysuria, hematuria and call her doctor if needed.  *Hypertension, diabetes.  Continue home medications.  Chronic pain syndrome.  Follows at the pain clinic.  She is being discharged home from emergency room.  All the records are reviewed and case discussed with Consulting provider. Management plans discussed with the patient, family and they are in agreement.  TOTAL TIME TAKING CARE OF THIS PATIENT: 40 minutes.    Molinda BailiffSrikar R Heather Streeper M.D on 04/25/2018 at 7:44 PM  Between 7am to 6pm - Pager - [phone]  After 6pm go to [email or web address] - password [email or web address] [facility]  [facility]  Office  [phone]  CC: Primary care Physician: Marina GoodellFeldpausch, Dale E, MD  Note: This dictation was prepared with Dragon dictation along with smaller phrase technology. Any transcriptional errors that result from this process are unintentional.

## 2018-04-25 NOTE — ED Provider Notes (Addendum)
[facility] Emergency Department Provider Note  ____________________________________________   First MD Initiated Contact with Patient 04/25/18 1331     (approximate)  I have reviewed the triage vital signs and the nursing notes.   HISTORY  Chief Complaint Altered Mental Status and Headache    HPI Chelsea Ruiz is a 78 y.o. female presents emergency department via EMS complaining of intermittent slurred speech this morning and confusion.  Headache started this morning but states that she did have a headache yesterday.  She states her pain is 7 for the headache.  She denies any nausea or vomiting.  She denies any chest pain or shortness of breath.  She states she did have some discomfort across the midsternal area.  She denies any fever or chills.  History of diabetes.  Patient states she has not been anywhere since before the COVID pandemic.  She is basically quarantined at home.   Patient does have a pacemaker   Past Medical History:  Diagnosis Date  . Depression   . Diabetes mellitus without complication (HCC)   . Dyspnea   . Dysrhythmia   . Elevated lipids   . Fatty liver   . Hypertension   . Hypothyroidism   . Sleep apnea   . Tremors of nervous system     Patient Active Problem List   Diagnosis Date Noted  . Hypomagnesemia 03/06/2018  . Peripheral neuropathy 03/06/2018  . 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 Chronic bilateral low back pain without sciatica (Primary Area of Pain) (L>R) 02/20/2018  . Pain in both hands (Secondary Area of Pain) (L>R) 02/20/2018  . Chronic neck pain (Tertiary Area of Pain) 02/20/2018  . Chronic pain syndrome 02/20/2018  . Long term current use of opiate analgesic 02/20/2018  . Pharmacologic therapy 02/20/2018  . Disorder of skeletal system 02/20/2018  . Problems influencing health status 02/20/2018  . Memory loss 12/12/2017  . Pedal edema 01/03/2017  . Status post placement of cardiac  pacemaker 11/27/2016  . Bradycardia, sinus 11/07/2016  . Lightheadedness 10/23/2016  . 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 Atrioventricular block, second degree 06/12/2016  . Nonrheumatic aortic valve stenosis 06/12/2016  . Chronic pain of right knee (Fourth Area of Pain) 06/05/2016  . Abnormal ECG 05/31/2016  . SOB (shortness of breath) on exertion 05/31/2016  . Sleep apnea 12/10/2015  . Wears hearing aid 12/10/2015  . Malfunction of spinal cord stimulator (HCC) 11/23/2015  . Chronic pain of multiple joints 11/11/2015  . Essential hypertension 01/27/2015  . Fatty (change of) liver, not elsewhere classified 01/27/2015  . Hyperlipidemia 01/27/2015  . Thyroid disease 01/27/2015  . Type 2 diabetes mellitus with stage 3 chronic kidney disease, without long-term current use of insulin (HCC) 01/27/2015  . Difficulty walking 11/26/2013  . Foot drop, left foot 11/26/2013  . Unspecified abnormalities of gait and mobility 11/26/2013  . Other closed fractures of distal end of radius (alone) 10/15/2013  . Depression 01/31/2012  . Biomechanical lesion, unspecified 09/19/2011  . Other secondary scoliosis, lumbar region 08/30/2011  . Spinal stenosis, lumbar region without neurogenic claudication 08/30/2011  . Chronic kidney disease (CKD), stage III (moderate) (HCC) 04/25/2011  . Chronic venous insufficiency 03/13/2011  . Mitral valve prolapse 03/13/2011  . Fibromyalgia 02/08/2011  . Other cervical disc degeneration, unspecified cervical region 02/08/2011  . 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 Osteoarthritis 12/02/2010    Past Surgical History:  Procedure Laterality Date  . BACK SURGERY    .  PACEMAKER INSERTION N/A 11/16/2016   Procedure: INSERTION PACEMAKER;  Surgeon: Marcina Millard, MD;  Location: [facility] ORS;  Service: Cardiovascular;  Laterality: N/A;  . TEAR DUCT PROBING    . total knee Bilateral     Prior to Admission medications   Medication Sig  Start Date End Date Taking? Authorizing Provider  amoxicillin (AMOXIL) 500 MG capsule Take 2,000 mg See admin instructions by mouth. Take 2000 mg by mouth 1 hour prior to dental appointment    [provider]  aspirin EC 81 MG tablet Take 81 mg daily by mouth.    [provider]  atorvastatin (LIPITOR) 80 MG tablet Take 40 mg daily by mouth. 09/30/16   [provider]  buPROPion (WELLBUTRIN XL) 150 MG 24 hr tablet Take 150 mg daily by mouth. 11/04/16   [provider]  donepezil (ARICEPT) 5 MG tablet Take 5 mg by mouth daily. 12/05/17   [provider]  DULoxetine (CYMBALTA) 60 MG capsule Take 60 mg daily by mouth. 10/09/16   [provider]  hydrochlorothiazide (HYDRODIURIL) 12.5 MG tablet Take 12.5 mg daily by mouth. 10/23/16   [provider]  levothyroxine (SYNTHROID, LEVOTHROID) 75 MCG tablet Take 75 mcg daily by mouth. 08/28/16   [provider]  lisinopril (PRINIVIL,ZESTRIL) 30 MG tablet Take 30 mg daily by mouth. 10/23/16   [provider]  Magnesium 500 MG CAPS Take 1 capsule (500 mg total) by mouth daily. 03/06/18 09/02/18  Delano Metz, MD  metFORMIN (GLUCOPHAGE) 500 MG tablet Take 500 mg 2 (two) times daily by mouth. 10/10/16   [provider]  Multiple Vitamins-Minerals (MULTIVITAMIN PO) Take 1 tablet daily by mouth.    [provider]  oxymorphone (OPANA) 5 MG tablet Take 5 mg every 8 (eight) hours as needed by mouth for pain.  10/25/16   [provider]  pioglitazone (ACTOS) 15 MG tablet Take 15 mg daily by mouth. 10/23/16   [provider]  vitamin B-12 (CYANOCOBALAMIN) 100 MCG tablet Take 100 mcg by mouth daily.    [provider]    Allergies Methadone; Morphine and related; and Oxycodone  History reviewed. No pertinent family history.  Social History Social History   Tobacco Use  . Smoking status: Never Smoker  . Smokeless tobacco: Never Used   Substance Use Topics  . Alcohol use: No    Frequency: Never  . Drug use: No    Review of Systems  Constitutional: No fever/chills, positive headache with slurred speech Eyes: No visual changes. ENT: No sore throat. Respiratory: Denies cough Cardiovascular: Denies chest pain or shortness of breath but did state midsternal discomfort Genitourinary: Negative for dysuria. Musculoskeletal: Negative for back pain. Neuro: Positive slurred speech Skin: Negative for rash.    ____________________________________________   PHYSICAL EXAM:  VITAL SIGNS: ED Triage Vitals  Enc Vitals Group     BP      Pulse      Resp      Temp      Temp src      SpO2      Weight      Height      Head Circumference      Peak Flow      Pain Score      Pain Loc      Pain Edu?      Excl. in GC?     Constitutional: Alert and oriented. Well appearing and in no acute distress. Eyes: Conjunctivae are normal.  Head:  Atraumatic. Nose: No congestion/rhinnorhea. Mouth/Throat: Mucous membranes are moist.   Neck:  supple no lymphadenopathy noted Cardiovascular: Normal rate, regular rhythm. Heart sounds are normal Respiratory: Normal respiratory effort.  No retractions, lungs anterior wheeze , abd: soft nontender bs normal all 4 quad GU: deferred Musculoskeletal: FROM all extremities, warm and well perfused Neurologic:  Normal speech and language.  Grips equal bilaterally Skin:  Skin is warm, dry and intact. No rash noted. Psychiatric: Mood and affect are normal. Speech and behavior are normal.  ____________________________________________   LABS (all labs ordered are listed, but only abnormal results are displayed)  Labs Reviewed  COMPREHENSIVE METABOLIC PANEL - Abnormal; Notable for the following components:      Result Value   Glucose, Bld 114 (*)    BUN 30 (*)    Creatinine, Ser 1.26 (*)    GFR calc non Af Amer 42 (*)    GFR calc Af Amer 48 (*)    All other components within normal  limits  CBC WITH DIFFERENTIAL/PLATELET - Abnormal; Notable for the following components:   RBC 3.75 (*)    Hemoglobin 11.2 (*)    HCT 35.0 (*)    All other components within normal limits  URINALYSIS, COMPLETE (UACMP) WITH MICROSCOPIC - Abnormal; Notable for the following components:   Color, Urine YELLOW (*)    APPearance HAZY (*)    Leukocytes,Ua SMALL (*)    Bacteria, UA RARE (*)    All other components within normal limits  APTT - Abnormal; Notable for the following components:   aPTT 37 (*)    All other components within normal limits  URINE CULTURE  TROPONIN I  PROTIME-INR  GLUCOSE, CAPILLARY  BRAIN NATRIURETIC PEPTIDE  CBG MONITORING, ED   ____________________________________________   ____________________________________________  RADIOLOGY  CT of the head is negative for any acute abnormality  ____________________________________________   PROCEDURES  Procedure(s) performed: EKG paced rhythm  Procedures    ____________________________________________   INITIAL IMPRESSION / ASSESSMENT AND PLAN / ED COURSE  Pertinent labs & imaging results that were available during my care of the patient were reviewed by me and considered in my medical decision making (see chart for details).   Patient is 78 year old female presents emergency department complaining of headache and slurred speech earlier today.  Also some chest discomfort.  However she does deny chest pain or shortness of breath.  Patient is poor historian.  Physical exam patient does appear well.  No slurred speech is noted.  Lungs with an anterior wheeze.  DDX: CVA, chronic headache, angina, MI  CT of the head, EKG, and labs were ordered.    ----------------------------------------- 4:01 PM on 04/25/2018 -----------------------------------------  Labs resulted with a UA with small amount of leuks and rare bacteria, comprehensive metabolic panel is basically normal, CBC shows a decreased hemoglobin  hematocrit, PT PTT are basically normal troponin is normal, CT of the head and chest x-ray are both normal  Discussed all the findings with Dr. Alphonzo Lemmings.  In the discussion Dr. Alphonzo Lemmings we did talk to the family.  Family is concerned as the confusion has been acutely worsening over the last couple of days.  Patient has also been complaining of some intermittent chest pain.  She is also had a sense of "doom "stating that she just thinks she is going to die.  Due to these findings Dr. Alphonzo Lemmings I both agree that it would be best for the patient to be admitted.    Page Dr. Elpidio Anis, he is to  see the patient and admit.  ----------------------------------------- 4:19 PM on 04/25/2018 -----------------------------------------  Dr. Elpidio AnisSudini into see the patient.  He did call the family.  He feels the patient can be discharged at this time.  She is to decrease her Lamictal to 25 mg twice daily.  He did discuss this with the family.  They are in agreement per Dr. Elpidio AnisSudini    As part of my medical decision making, I reviewed the following data within the electronic MEDICAL RECORD NUMBER History obtained from family, Nursing notes reviewed and incorporated, Labs reviewed see above, EKG interpreted paced rhythm, Old chart reviewed, Radiograph reviewed CT of the head and chest x-ray are both normal, Discussed with admitting physician Dr. Elpidio AnisSudini, Evaluated by EM attending Dr. Alphonzo LemmingsMcshane, Notes from prior ED visits and [place] Controlled Substance Database  ____________________________________________   FINAL CLINICAL IMPRESSION(S) / ED DIAGNOSES  Final diagnoses:  Confusion  Nonspecific chest pain  Urinary tract infection without hematuria, site unspecified      NEW MEDICATIONS STARTED DURING THIS VISIT:  New Prescriptions   No medications on file     Note:  This document was prepared using Dragon voice recognition software and may include unintentional dictation errors.    Faythe GheeFisher,  W, PA-C 04/25/18 1604     Sherrie MustacheFisher, Roselyn Bering W, PA-C 04/25/18 1620    Jeanmarie PlantMcShane, James A, MD 04/26/18 [phone]

## 2018-04-25 NOTE — Progress Notes (Signed)
Patient in emergency room with intermittent confusion which is more than her baseline.  Also weak complains of pain in her shoulders arms chest head.  All investigations have been normal.  Some bacteria in the urine but no fever.  Normal WBC.  No dysuria.  Discussed with daughter.  Thinks symptoms are little worse since starting Lamictal.Patient is on 50 mg twice daily.  I have advised to cut it down to 25 mg twice daily.  All other medications remain the same.No antibiotics needed at this time. Can be discharged home to follow-up with neurology and primary care physician.  Will need outpatient psychiatry follow-up if not improved. Asked daughter to follow on any fever, hematuria, dysuria.  Full consult to follow.

## 2018-04-25 NOTE — ED Provider Notes (Addendum)
[facility] Emergency Department Provider Note  ____________________________________________   I have reviewed the triage vital signs and the nursing notes. Where available I have reviewed prior notes and, if possible and indicated, outside hospital notes.    HISTORY  Chief Complaint Altered Mental Status and Headache    HPI Chelsea Ruiz is a 78 y.o. female   Who according to her daughter has a history of confusion but is more confused than normal.  History is otherwise quite limited.  Please see PA history.  Patient to me denies headache chest pain shortness breath nausea vomiting abdominal pain, she states she just feels "pretty good". Level 5 chart caveat; no further history available due to patient status.  Original call was for headache the patient denies headache to me states she has had headaches for years but has had no different or new headaches, denies focal numbness or weakness.  There was also some question about slurred speech, she denies it, and it may be that this was more of a confusion call.  PA is calling to get more history from the daughter.  Past Medical History:  Diagnosis Date  . Depression   . Diabetes mellitus without complication (HCC)   . Dyspnea   . Dysrhythmia   . Elevated lipids   . Fatty liver   . Hypertension   . Hypothyroidism   . Sleep apnea   . Tremors of nervous system     Patient Active Problem List   Diagnosis Date Noted  . Hypomagnesemia 03/06/2018  . Peripheral neuropathy 03/06/2018  . 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 Chronic bilateral low back pain without sciatica (Primary Area of Pain) (L>R) 02/20/2018  . Pain in both hands (Secondary Area of Pain) (L>R) 02/20/2018  . Chronic neck pain (Tertiary Area of Pain) 02/20/2018  . Chronic pain syndrome 02/20/2018  . Long term current use of opiate analgesic 02/20/2018  . Pharmacologic therapy 02/20/2018  . Disorder of skeletal system  02/20/2018  . Problems influencing health status 02/20/2018  . Memory loss 12/12/2017  . Pedal edema 01/03/2017  . 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/27/2016  . Bradycardia, sinus 11/07/2016  . Lightheadedness 10/23/2016  . 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 Atrioventricular block, second degree 06/12/2016  . Nonrheumatic aortic valve stenosis 06/12/2016  . Chronic pain of right knee (Fourth Area of Pain) 06/05/2016  . Abnormal ECG 05/31/2016  . SOB (shortness of breath) on exertion 05/31/2016  . Sleep apnea 12/10/2015  . Wears hearing aid 12/10/2015  . Malfunction of spinal cord stimulator (HCC) 11/23/2015  . Chronic pain of multiple joints 11/11/2015  . Essential hypertension 01/27/2015  . Fatty (change of) liver, not elsewhere classified 01/27/2015  . Hyperlipidemia 01/27/2015  . Thyroid disease 01/27/2015  . Type 2 diabetes mellitus with stage 3 chronic kidney disease, without long-term current use of insulin (HCC) 01/27/2015  . Difficulty walking 11/26/2013  . Foot drop, left foot 11/26/2013  . Unspecified abnormalities of gait and mobility 11/26/2013  . Other closed fractures of distal end of radius (alone) 10/15/2013  . Depression 01/31/2012  . Biomechanical lesion, unspecified 09/19/2011  . Other secondary scoliosis, lumbar region 08/30/2011  . Spinal stenosis, lumbar region without neurogenic claudication 08/30/2011  . Chronic kidney disease (CKD), stage III (moderate) (HCC) 04/25/2011  . Chronic venous insufficiency 03/13/2011  . Mitral valve prolapse 03/13/2011  . Fibromyalgia 02/08/2011  . Other cervical disc degeneration, unspecified cervical region 02/08/2011  . 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 Osteoarthritis 12/02/2010    Past Surgical History:  Procedure Laterality Date  . BACK SURGERY    . PACEMAKER INSERTION N/A 11/16/2016   Procedure: INSERTION PACEMAKER;  Surgeon: Marcina Millard, MD;  Location: [facility]  ORS;  Service: Cardiovascular;  Laterality: N/A;  . TEAR DUCT PROBING    . total knee Bilateral     Prior to Admission medications   Medication Sig Start Date End Date Taking? Authorizing Provider  amoxicillin (AMOXIL) 500 MG capsule Take 2,000 mg See admin instructions by mouth. Take 2000 mg by mouth 1 hour prior to dental appointment    [provider]  aspirin EC 81 MG tablet Take 81 mg daily by mouth.    [provider]  atorvastatin (LIPITOR) 80 MG tablet Take 40 mg daily by mouth. 09/30/16   [provider]  buPROPion (WELLBUTRIN XL) 150 MG 24 hr tablet Take 150 mg daily by mouth. 11/04/16   [provider]  donepezil (ARICEPT) 5 MG tablet Take 5 mg by mouth daily. 12/05/17   [provider]  DULoxetine (CYMBALTA) 60 MG capsule Take 60 mg daily by mouth. 10/09/16   [provider]  hydrochlorothiazide (HYDRODIURIL) 12.5 MG tablet Take 12.5 mg daily by mouth. 10/23/16   [provider]  levothyroxine (SYNTHROID, LEVOTHROID) 75 MCG tablet Take 75 mcg daily by mouth. 08/28/16   [provider]  lisinopril (PRINIVIL,ZESTRIL) 30 MG tablet Take 30 mg daily by mouth. 10/23/16   [provider]  Magnesium 500 MG CAPS Take 1 capsule (500 mg total) by mouth daily. 03/06/18 09/02/18  Delano Metz, MD  metFORMIN (GLUCOPHAGE) 500 MG tablet Take 500 mg 2 (two) times daily by mouth. 10/10/16   [provider]  Multiple Vitamins-Minerals (MULTIVITAMIN PO) Take 1 tablet daily by mouth.    [provider]  oxymorphone (OPANA) 5 MG tablet Take 5 mg every 8 (eight) hours as needed by mouth for pain.  10/25/16   [provider]  pioglitazone (ACTOS) 15 MG tablet Take 15 mg daily by mouth. 10/23/16   [provider]  vitamin B-12 (CYANOCOBALAMIN) 100 MCG tablet Take 100 mcg by mouth daily.    [provider]    Allergies Methadone; Morphine and related; and Oxycodone  History  reviewed. No pertinent family history.  Social History Social History   Tobacco Use  . Smoking status: Never Smoker  . Smokeless tobacco: Never Used  Substance Use Topics  . Alcohol use: No    Frequency: Never  . Drug use: No    Review of Systems Level 5 chart caveat; no further history available due to patient status.   ____________________________________________   PHYSICAL EXAM:  VITAL SIGNS: ED Triage Vitals  Enc Vitals Group     BP 04/25/18 1347 108/75     Pulse Rate 04/25/18 1347 80     Resp 04/25/18 1347 (!) 22     Temp 04/25/18 1347 98.4 F (36.9 C)     Temp Source 04/25/18 1347 Oral     SpO2 04/25/18 1347 93 %     Weight 04/25/18 1350 196 lb (88.9 kg)     Height 04/25/18 1350  (1.575 m)     Head Circumference --      Peak Flow --      Pain Score 04/25/18 1348 7     Pain Loc --      Pain Edu? --      Excl. in GC? --  Constitutional: Alert and oriented to name and place unsure of the date. Well appearing and in no acute distress. Eyes: Conjunctivae are normal Head: Atraumatic HEENT: No congestion/rhinnorhea. Mucous membranes are moist.  Oropharynx non-erythematous Neck:   Nontender with no meningismus, no masses, no stridor Cardiovascular: Normal rate, regular rhythm. Grossly normal heart sounds.  Good peripheral circulation. Respiratory: Normal respiratory effort.  No retractions. Lungs CTAB. Abdominal: Soft and nontender. No distention. No guarding no rebound Back:  There is no focal tenderness or step off.  there is no midline tenderness there are no lesions noted. there is no CVA tenderness Musculoskeletal: No lower extremity tenderness, no upper extremity tenderness. No joint effusions, no DVT signs strong distal pulses no edema Neurologic:  Normal speech and language. No gross focal neurologic deficits are appreciated.  Skin:  Skin is warm, dry and intact. No rash noted. Psychiatric: Mood and affect are  normal  ____________________________________________   LABS (all labs ordered are listed, but only abnormal results are displayed)  Labs Reviewed  COMPREHENSIVE METABOLIC PANEL - Abnormal; Notable for the following components:      Result Value   Glucose, Bld 114 (*)    BUN 30 (*)    Creatinine, Ser 1.26 (*)    GFR calc non Af Amer 42 (*)    GFR calc Af Amer 48 (*)    All other components within normal limits  CBC WITH DIFFERENTIAL/PLATELET - Abnormal; Notable for the following components:   RBC 3.75 (*)    Hemoglobin 11.2 (*)    HCT 35.0 (*)    All other components within normal limits  APTT - Abnormal; Notable for the following components:   aPTT 37 (*)    All other components within normal limits  TROPONIN I  PROTIME-INR  GLUCOSE, CAPILLARY  URINALYSIS, COMPLETE (UACMP) WITH MICROSCOPIC  CBG MONITORING, ED    Pertinent labs  results that were available during my care of the patient were reviewed by me and considered in my medical decision making (see chart for details). ____________________________________________  EKG  I personally interpreted any EKGs ordered by me or triage AV paced rhythm rate 83 bpm ____________________________________________  RADIOLOGY  Pertinent labs & imaging results that were available during my care of the patient were reviewed by me and considered in my medical decision making (see chart for details). If possible, patient and/or family made aware of any abnormal findings.  Ct Head Wo Contrast  Result Date: 04/25/2018 CLINICAL DATA:  78 year old female with altered mental status EXAM: CT HEAD WITHOUT CONTRAST TECHNIQUE: Contiguous axial images were obtained from the base of the skull through the vertex without intravenous contrast. COMPARISON:  11/13/2017, 02/27/2014 FINDINGS: Brain: No acute intracranial hemorrhage. No midline shift or mass effect. Gray-white differentiation maintained. Patchy hypodensity in the bilateral periventricular  white matter, similar prior. Unremarkable appearance of the ventricular system. Vascular: Calcifications of the intracranial vasculature. Skull: No acute fracture.  No aggressive bone lesion identified. Sinuses/Orbits: Unremarkable appearance of the orbits. Mastoid air cells clear. No middle ear effusion. No significant sinus disease. Other: None IMPRESSION: Negative for acute intracranial abnormality. Chronic microvascular ischemic disease. Electronically Signed   By: Gilmer Mor D.O.   On: 04/25/2018 14:06   ____________________________________________    PROCEDURES  Procedure(s) performed: None  Procedures  Critical Care performed: None  ____________________________________________   INITIAL IMPRESSION / ASSESSMENT AND PLAN / ED COURSE  Pertinent labs & imaging results that were available during my care of the patient were reviewed by me  and considered in my medical decision making (see chart for details).  Nonfocal, with no real complaints, does seem somewhat confused, unclear exactly what her baseline is, we are working her up.  According to family, she is more confused than normal. Signed out to dr. Roxan Hockeyobinson at the end of my shift.    ____________________________________________   FINAL CLINICAL IMPRESSION(S) / ED DIAGNOSES  Final diagnoses:  None      This chart was dictated using voice recognition software.  Despite best efforts to proofread,  errors can occur which can change meaning.      Jeanmarie PlantMcShane, Lakysha Kossman A, MD 04/25/18 1504    Jeanmarie PlantMcShane, Adileny Delon A, MD 04/25/18 1537

## 2018-04-25 NOTE — ED Triage Notes (Signed)
Pt arrived by EMS with c/o intermittent slurred speech this am and confusion. C/O headache since this morning. Pt. alert and oriented x4, no slurred speech noted. ED provider at bedside.

## 2018-04-27 LAB — URINE CULTURE: Culture: 50000 — AB

## 2018-10-04 ENCOUNTER — Other Ambulatory Visit: Payer: Self-pay

## 2018-10-04 DIAGNOSIS — Z20822 Contact with and (suspected) exposure to covid-19: Secondary | ICD-10-CM

## 2018-10-06 LAB — NOVEL CORONAVIRUS, NAA: SARS-CoV-2, NAA: NOT DETECTED

## 2019-08-15 ENCOUNTER — Other Ambulatory Visit: Payer: Self-pay

## 2019-08-15 ENCOUNTER — Emergency Department: Payer: Medicare Other

## 2019-08-15 DIAGNOSIS — I129 Hypertensive chronic kidney disease with stage 1 through stage 4 chronic kidney disease, or unspecified chronic kidney disease: Secondary | ICD-10-CM | POA: Insufficient documentation

## 2019-08-15 DIAGNOSIS — Y998 Other external cause status: Secondary | ICD-10-CM | POA: Diagnosis not present

## 2019-08-15 DIAGNOSIS — E1122 Type 2 diabetes mellitus with diabetic chronic kidney disease: Secondary | ICD-10-CM | POA: Diagnosis not present

## 2019-08-15 DIAGNOSIS — J45909 Unspecified asthma, uncomplicated: Secondary | ICD-10-CM | POA: Insufficient documentation

## 2019-08-15 DIAGNOSIS — Z794 Long term (current) use of insulin: Secondary | ICD-10-CM | POA: Diagnosis not present

## 2019-08-15 DIAGNOSIS — Z79899 Other long term (current) drug therapy: Secondary | ICD-10-CM | POA: Diagnosis not present

## 2019-08-15 DIAGNOSIS — R531 Weakness: Secondary | ICD-10-CM | POA: Insufficient documentation

## 2019-08-15 DIAGNOSIS — E039 Hypothyroidism, unspecified: Secondary | ICD-10-CM | POA: Diagnosis not present

## 2019-08-15 DIAGNOSIS — S0990XA Unspecified injury of head, initial encounter: Secondary | ICD-10-CM | POA: Insufficient documentation

## 2019-08-15 DIAGNOSIS — W1830XA Fall on same level, unspecified, initial encounter: Secondary | ICD-10-CM | POA: Insufficient documentation

## 2019-08-15 DIAGNOSIS — S0003XA Contusion of scalp, initial encounter: Secondary | ICD-10-CM | POA: Insufficient documentation

## 2019-08-15 DIAGNOSIS — Y9289 Other specified places as the place of occurrence of the external cause: Secondary | ICD-10-CM | POA: Insufficient documentation

## 2019-08-15 DIAGNOSIS — N183 Chronic kidney disease, stage 3 unspecified: Secondary | ICD-10-CM | POA: Insufficient documentation

## 2019-08-15 DIAGNOSIS — Y9389 Activity, other specified: Secondary | ICD-10-CM | POA: Insufficient documentation

## 2019-08-15 DIAGNOSIS — M545 Low back pain: Secondary | ICD-10-CM | POA: Diagnosis present

## 2019-08-15 LAB — COMPREHENSIVE METABOLIC PANEL
ALT: 22 U/L (ref 0–44)
AST: 29 U/L (ref 15–41)
Albumin: 4.2 g/dL (ref 3.5–5.0)
Alkaline Phosphatase: 58 U/L (ref 38–126)
Anion gap: 13 (ref 5–15)
BUN: 21 mg/dL (ref 8–23)
CO2: 30 mmol/L (ref 22–32)
Calcium: 9.6 mg/dL (ref 8.9–10.3)
Chloride: 97 mmol/L — ABNORMAL LOW (ref 98–111)
Creatinine, Ser: 1.28 mg/dL — ABNORMAL HIGH (ref 0.44–1.00)
GFR calc Af Amer: 47 mL/min — ABNORMAL LOW (ref >60)
GFR calc non Af Amer: 40 mL/min — ABNORMAL LOW (ref >60)
Glucose, Bld: 111 mg/dL — ABNORMAL HIGH (ref 70–99)
Potassium: 3.6 mmol/L (ref 3.5–5.1)
Sodium: 140 mmol/L (ref 135–145)
Total Bilirubin: 0.8 mg/dL (ref 0.3–1.2)
Total Protein: 7.2 g/dL (ref 6.5–8.1)

## 2019-08-15 LAB — CBC
HCT: 36.7 % (ref 36.0–46.0)
Hemoglobin: 11.9 g/dL — ABNORMAL LOW (ref 12.0–15.0)
MCH: 30.1 pg (ref 26.0–34.0)
MCHC: 32.4 g/dL (ref 30.0–36.0)
MCV: 92.7 fL (ref 80.0–100.0)
Platelets: 256 10*3/uL (ref 150–400)
RBC: 3.96 MIL/uL (ref 3.87–5.11)
RDW: 14.7 % (ref 11.5–15.5)
WBC: 7 10*3/uL (ref 4.0–10.5)
nRBC: 0 % (ref 0.0–0.2)

## 2019-08-15 LAB — URINALYSIS, COMPLETE (UACMP) WITH MICROSCOPIC
Bacteria, UA: NONE SEEN
Bilirubin Urine: NEGATIVE
Glucose, UA: NEGATIVE mg/dL
Hgb urine dipstick: NEGATIVE
Ketones, ur: NEGATIVE mg/dL
Nitrite: NEGATIVE
Protein, ur: NEGATIVE mg/dL
Specific Gravity, Urine: 1.011 (ref 1.005–1.030)
pH: 7 (ref 5.0–8.0)

## 2019-08-15 NOTE — ED Triage Notes (Signed)
Pt comes EMS from [place] [facility] after falling and hitting head. Acting odd for a week. Waiting on UA results. Strong urine odor. Oriented at baseline to self and place.

## 2019-08-16 ENCOUNTER — Emergency Department
Admission: EM | Admit: 2019-08-16 | Discharge: 2019-08-16 | Disposition: A | Discharge status: Home / Self Care | Payer: Medicare Other | Attending: Emergency Medicine | Admitting: Emergency Medicine

## 2019-08-16 DIAGNOSIS — W19XXXA Unspecified fall, initial encounter: Secondary | ICD-10-CM

## 2019-08-16 DIAGNOSIS — S0003XA Contusion of scalp, initial encounter: Secondary | ICD-10-CM

## 2019-08-16 DIAGNOSIS — S0990XA Unspecified injury of head, initial encounter: Secondary | ICD-10-CM

## 2019-08-16 DIAGNOSIS — R531 Weakness: Secondary | ICD-10-CM

## 2019-08-16 LAB — GLUCOSE, CAPILLARY: Glucose-Capillary: 99 mg/dL (ref 70–99)

## 2019-08-16 MED ORDER — ACETAMINOPHEN 500 MG PO TABS
1000.0000 mg | ORAL_TABLET | Freq: Once | ORAL | Status: AC
  Administered 2019-08-16: 1000 mg via ORAL
  Filled 2019-08-16: qty 2

## 2019-08-16 NOTE — ED Notes (Signed)
Pt assisted to the bathroom. Pt was able to urinate. Pt placed back in recliner in [facility]. Daughter at side. Pt has no further needs at this time.

## 2019-08-16 NOTE — Discharge Instructions (Signed)
Please continue your medication regimen at your facility.  Continue to use her walker when getting around, get up slowly from a seated and lying position to help acclimate and prevent further falls.  If he develop any fevers, further falls or concerns, please return to the ED.

## 2019-08-16 NOTE — ED Provider Notes (Signed)
[facility] Emergency Department Provider Note ____________________________________________   First MD Initiated Contact with Patient 08/16/19 [phone]     (approximate)  I have reviewed the triage vital signs and the nursing notes.  HISTORY  Chief Complaint Fall   HPI Chelsea Ruiz is a 79 y.o. femalewho presents to the ED for evaluation of fall  Chart review indicates history of DM on Metformin, hypothyroidism, HTN, HLD.  Patient takes ASA 81 as only antithrombotic medication.  Aricept for memory, patient has mild dementia and resides at a local assisted living facility.  Patient presents with daughter, who provides further collaborative history. Patient is typically ambulatory with a walker only. History of chronic pain, follows with pain management, takes regular opiate medications, per the daughter.  Patient and daughter report 2 falls within the past week.  1 was 5 days ago, and the last was yesterday afternoon.  Due to hearing about the second fall from the facility, daughter has her brought to the ED for evaluation.  Daughter reports concern for UTI causing her falls this past 1 week because she "just seems a little weaker."  She reports a urinalysis was performed at patient's facility, and the results are pending.  Here in the ED, patient reports chronic and diffuse pain of her back, hips, arms.  She reports it is of a similar quality is normal, but slightly worse intensity.  Now 4/10, with typically 3/10 intensity.  Aching, constant.  Denies recent fevers, medical illnesses, syncope, chest pain, shortness of breath, vomiting or abdominal pain.  Denies dysuria.   Past Medical History:  Diagnosis Date  . Depression   . Diabetes mellitus without complication (HCC)   . Dyspnea   . Dysrhythmia   . Elevated lipids   . Fatty liver   . Hypertension   . Hypothyroidism   . Sleep apnea   . Tremors of nervous system     Patient Active Problem  List   Diagnosis Date Noted  . Hypomagnesemia 03/06/2018  . Peripheral neuropathy 03/06/2018  . 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 Chronic bilateral low back pain without sciatica (Primary Area of Pain) (L>R) 02/20/2018  . Pain in both hands (Secondary Area of Pain) (L>R) 02/20/2018  . Chronic neck pain (Tertiary Area of Pain) 02/20/2018  . Chronic pain syndrome 02/20/2018  . Long term current use of opiate analgesic 02/20/2018  . Pharmacologic therapy 02/20/2018  . Disorder of skeletal system 02/20/2018  . Problems influencing health status 02/20/2018  . Memory loss 12/12/2017  . Pedal edema 01/03/2017  . 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/27/2016  . Bradycardia, sinus 11/07/2016  . Lightheadedness 10/23/2016  . 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 Atrioventricular block, second degree 06/12/2016  . Nonrheumatic aortic valve stenosis 06/12/2016  . Chronic pain of right knee (Fourth Area of Pain) 06/05/2016  . Abnormal ECG 05/31/2016  . SOB (shortness of breath) on exertion 05/31/2016  . Sleep apnea 12/10/2015  . Wears hearing aid 12/10/2015  . Malfunction of spinal cord stimulator (HCC) 11/23/2015  . Chronic pain of multiple joints 11/11/2015  . Essential hypertension 01/27/2015  . Fatty (change of) liver, not elsewhere classified 01/27/2015  . Hyperlipidemia 01/27/2015  . Thyroid disease 01/27/2015  . Type 2 diabetes mellitus with stage 3 chronic kidney disease, without long-term current use of insulin (HCC) 01/27/2015  . Difficulty walking 11/26/2013  . Foot drop, left foot 11/26/2013  . Unspecified abnormalities of gait and mobility 11/26/2013  .  Other closed fractures of distal end of radius (alone) 10/15/2013  . Depression 01/31/2012  . Biomechanical lesion, unspecified 09/19/2011  . Other secondary scoliosis, lumbar region 08/30/2011  . Spinal stenosis, lumbar region without neurogenic claudication 08/30/2011  . Chronic kidney  disease (CKD), stage III (moderate) 04/25/2011  . Chronic venous insufficiency 03/13/2011  . Mitral valve prolapse 03/13/2011  . Fibromyalgia 02/08/2011  . Other cervical disc degeneration, unspecified cervical region 02/08/2011  . 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 Osteoarthritis 12/02/2010    Past Surgical History:  Procedure Laterality Date  . BACK SURGERY    . PACEMAKER INSERTION N/A 11/16/2016   Procedure: INSERTION PACEMAKER;  Surgeon: Marcina Millard, MD;  Location: [facility] ORS;  Service: Cardiovascular;  Laterality: N/A;  . TEAR DUCT PROBING    . total knee Bilateral     Prior to Admission medications   Medication Sig Start Date End Date Taking? Authorizing Provider  amoxicillin (AMOXIL) 500 MG capsule Take 2,000 mg See admin instructions by mouth. Take 2000 mg by mouth 1 hour prior to dental appointment    [provider]  aspirin EC 81 MG tablet Take 81 mg daily by mouth.    [provider]  atorvastatin (LIPITOR) 80 MG tablet Take 40 mg daily by mouth. 09/30/16   [provider]  buPROPion (WELLBUTRIN XL) 150 MG 24 hr tablet Take 150 mg daily by mouth. 11/04/16   [provider]  donepezil (ARICEPT) 5 MG tablet Take 5 mg by mouth daily. 12/05/17   [provider]  DULoxetine (CYMBALTA) 60 MG capsule Take 60 mg daily by mouth. 10/09/16   [provider]  hydrochlorothiazide (HYDRODIURIL) 12.5 MG tablet Take 12.5 mg daily by mouth. 10/23/16   [provider]  levothyroxine (SYNTHROID, LEVOTHROID) 75 MCG tablet Take 75 mcg daily by mouth. 08/28/16   [provider]  lisinopril (PRINIVIL,ZESTRIL) 30 MG tablet Take 30 mg daily by mouth. 10/23/16   [provider]  Magnesium 500 MG CAPS Take 1 capsule (500 mg total) by mouth daily. 03/06/18 09/02/18  Delano Metz, MD  metFORMIN (GLUCOPHAGE) 500 MG tablet Take 500 mg 2 (two) times daily by mouth. 10/10/16   [provider]    Multiple Vitamins-Minerals (MULTIVITAMIN PO) Take 1 tablet daily by mouth.    [provider]  oxymorphone (OPANA) 5 MG tablet Take 5 mg every 8 (eight) hours as needed by mouth for pain.  10/25/16   [provider]  pioglitazone (ACTOS) 15 MG tablet Take 15 mg daily by mouth. 10/23/16   [provider]  vitamin B-12 (CYANOCOBALAMIN) 100 MCG tablet Take 100 mcg by mouth daily.    [provider]    Allergies Methadone, Morphine and related, and Oxycodone  History reviewed. No pertinent family history.  Social History Social History   Tobacco Use  . Smoking status: Never Smoker  . Smokeless tobacco: Never Used  Vaping Use  . Vaping Use: Never used  Substance Use Topics  . Alcohol use: No  . Drug use: No    Review of Systems  Constitutional: No fever/chills.  Positive generalized weakness. Eyes: No visual changes. ENT: No sore throat. Cardiovascular: Denies chest pain. Respiratory: Denies shortness of breath. Gastrointestinal: No abdominal pain.  No nausea, no vomiting.  No diarrhea.  No constipation. Genitourinary: Negative for dysuria. Musculoskeletal: Positive for chronic pain of the back, hips and arms. Skin: Negative for rash. Neurological: Negative for headaches, focal weakness or numbness.   ____________________________________________  PHYSICAL EXAM:  VITAL SIGNS: Vitals:   08/16/19 0630 08/16/19 0830  BP: (!) 159/73 (!) 170/93  Pulse: 85 89  Resp: 18 19  Temp: 98.1 F (36.7 C)   SpO2: 97% 93%      Constitutional: Alert and oriented. Well appearing and in no acute distress. Eyes: Conjunctivae are normal. PERRL. EOMI. Head: Small left temporal hematoma without discrete laceration or evidence of open injury.  EOM intact and does not elicit pain.  No evidence of entrapment. Nose: No congestion/rhinnorhea. Mouth/Throat: Mucous membranes are moist.  Oropharynx non-erythematous. Neck: No stridor. No cervical spine  tenderness to palpation. Cardiovascular: Normal rate, regular rhythm. Grossly normal heart sounds.  Good peripheral circulation. Respiratory: Normal respiratory effort.  No retractions. Lungs CTAB. Gastrointestinal: Soft , nondistended, nontender to palpation. No abdominal bruits. No CVA tenderness. Musculoskeletal: No lower extremity tenderness nor edema.  No joint effusions. No signs of acute trauma beyond her head hematoma. Full palpation of all 4 extremities without evidence of acute traumatic pathology or deformity.  No spinal tenderness throughout, some mild paraspinal thoracic tenderness on the left without overlying skin changes or signs of trauma. Neurologic:  Normal speech and language. No gross focal neurologic deficits are appreciated. No gait instability noted. Cranial nerves II through XII intact 5/5 strength and sensation in all 4 extremities Skin:  Skin is warm, dry and intact. No rash noted. Psychiatric: Mood and affect are normal. Speech and behavior are normal.  ____________________________________________   LABS (all labs ordered are listed, but only abnormal results are displayed)  Labs Reviewed  COMPREHENSIVE METABOLIC PANEL - Abnormal; Notable for the following components:      Result Value   Chloride 97 (*)    Glucose, Bld 111 (*)    Creatinine, Ser 1.28 (*)    GFR calc non Af Amer 40 (*)    GFR calc Af Amer 47 (*)    All other components within normal limits  CBC - Abnormal; Notable for the following components:   Hemoglobin 11.9 (*)    All other components within normal limits  URINALYSIS, COMPLETE (UACMP) WITH MICROSCOPIC - Abnormal; Notable for the following components:   Color, Urine YELLOW (*)    APPearance HAZY (*)    Leukocytes,Ua SMALL (*)    All other components within normal limits  GLUCOSE, CAPILLARY  CBG MONITORING, ED   ____________________________________________  12 Lead EKG Ventricularly paced rhythm at patient's baseline, rate of 91  bpm.  Normal axis.  Expected intervals, consistent with LBBB morphology.  No evidence of acute ischemia per Sgarbossa criteria  ____________________________________________  RADIOLOGY  ED MD interpretation: CT head reviewed and without evidence of acute intracranial pathology.  Official radiology report(s): CT Head Wo Contrast  Result Date: 08/15/2019 CLINICAL DATA:  Larey SeatFell and hit head EXAM: CT HEAD WITHOUT CONTRAST TECHNIQUE: Contiguous axial images were obtained from the base of the skull through the vertex without intravenous contrast. COMPARISON:  CT brain 04/25/2018 FINDINGS: Brain: No acute territorial infarction, hemorrhage or intracranial mass. Moderate hypodensity in the bilateral white matter consistent with chronic small vessel ischemic change. Stable ventricle size. Vascular: No hyperdense vessels. Vertebral and carotid vascular calcification Skull: Normal. Negative for fracture or focal lesion. Sinuses/Orbits: No acute finding. Other: None IMPRESSION: 1. No CT evidence for acute intracranial abnormality. 2. Chronic small vessel ischemic changes of the white matter. Electronically Signed   By: Jasmine PangKim  Fujinaga M.D.   On: 08/15/2019 19:03    ____________________________________________   PROCEDURES and INTERVENTIONS  Procedure(s) performed (  including Critical Care):  Procedures  Medications  acetaminophen (TYLENOL) tablet 1,000 mg (has no administration in time range)    ____________________________________________   INITIAL IMPRESSION / ASSESSMENT AND PLAN / ED COURSE  79 year old woman on ASA 65 presenting for evaluation from her local SNF after a fall and head injury, without evidence of acute traumatic or additional pathology, amenable to return to facility.  Mild and persistent hypertension, otherwise normal vital signs on room air.  Reassuring examination without evidence of distress or significant traumatic pathology.  She has a small hematoma to her left scalp, but no  discrete laceration or evidence of additional injury.  Blood work without acute derangements.  Noninfectious UA.  CT imaging without ICH or underlying fracture.  Patient is reporting chronic pain, but no evidence of additional acute pathology.  Daughter and patient are reassured that there has been no injury, and I am reassured by patient waiting about 14 hours in the waiting room prior to rooming.  Within this observation period, she has demonstrated no focal neurologic deficits, repeat falls, syncopal episodes.  She is tolerating p.o. intake and tolerated without complication.  It is reasonable to return to her facility, and patient and daughter are comfortable with this.  We discussed return precautions for the ED, and patient is medically stable for discharge home.  ____________________________________________   FINAL CLINICAL IMPRESSION(S) / ED DIAGNOSES  Final diagnoses:  Injury of head, initial encounter  Fall, initial encounter  Scalp hematoma, initial encounter  Generalized weakness     ED Discharge Orders    None       Jacy Brocker   Note:  This document was prepared using Dragon voice recognition software and may include unintentional dictation errors.   Delton Prairie, MD 08/16/19 [phone]

## 2019-09-07 ENCOUNTER — Emergency Department: Payer: Medicare Other

## 2019-09-07 ENCOUNTER — Emergency Department
Admission: EM | Admit: 2019-09-07 | Discharge: 2019-09-07 | Disposition: A | Discharge status: Home / Self Care | Payer: Medicare Other | Attending: Emergency Medicine | Admitting: Emergency Medicine

## 2019-09-07 ENCOUNTER — Other Ambulatory Visit: Payer: Self-pay

## 2019-09-07 DIAGNOSIS — Y9389 Activity, other specified: Secondary | ICD-10-CM | POA: Diagnosis not present

## 2019-09-07 DIAGNOSIS — W19XXXA Unspecified fall, initial encounter: Secondary | ICD-10-CM | POA: Diagnosis not present

## 2019-09-07 DIAGNOSIS — S51012A Laceration without foreign body of left elbow, initial encounter: Secondary | ICD-10-CM | POA: Insufficient documentation

## 2019-09-07 DIAGNOSIS — I129 Hypertensive chronic kidney disease with stage 1 through stage 4 chronic kidney disease, or unspecified chronic kidney disease: Secondary | ICD-10-CM | POA: Diagnosis not present

## 2019-09-07 DIAGNOSIS — S0083XA Contusion of other part of head, initial encounter: Secondary | ICD-10-CM | POA: Diagnosis not present

## 2019-09-07 DIAGNOSIS — Y999 Unspecified external cause status: Secondary | ICD-10-CM | POA: Insufficient documentation

## 2019-09-07 DIAGNOSIS — S0990XA Unspecified injury of head, initial encounter: Secondary | ICD-10-CM | POA: Diagnosis not present

## 2019-09-07 DIAGNOSIS — Z79890 Hormone replacement therapy (postmenopausal): Secondary | ICD-10-CM | POA: Insufficient documentation

## 2019-09-07 DIAGNOSIS — Z96651 Presence of right artificial knee joint: Secondary | ICD-10-CM | POA: Insufficient documentation

## 2019-09-07 DIAGNOSIS — E1122 Type 2 diabetes mellitus with diabetic chronic kidney disease: Secondary | ICD-10-CM | POA: Diagnosis not present

## 2019-09-07 DIAGNOSIS — E1140 Type 2 diabetes mellitus with diabetic neuropathy, unspecified: Secondary | ICD-10-CM | POA: Insufficient documentation

## 2019-09-07 DIAGNOSIS — E039 Hypothyroidism, unspecified: Secondary | ICD-10-CM | POA: Insufficient documentation

## 2019-09-07 DIAGNOSIS — Z79899 Other long term (current) drug therapy: Secondary | ICD-10-CM | POA: Diagnosis not present

## 2019-09-07 DIAGNOSIS — Y9289 Other specified places as the place of occurrence of the external cause: Secondary | ICD-10-CM | POA: Diagnosis not present

## 2019-09-07 DIAGNOSIS — Z7984 Long term (current) use of oral hypoglycemic drugs: Secondary | ICD-10-CM | POA: Diagnosis not present

## 2019-09-07 DIAGNOSIS — Z7982 Long term (current) use of aspirin: Secondary | ICD-10-CM | POA: Diagnosis not present

## 2019-09-07 DIAGNOSIS — N183 Chronic kidney disease, stage 3 unspecified: Secondary | ICD-10-CM | POA: Diagnosis not present

## 2019-09-07 LAB — URINALYSIS, ROUTINE W REFLEX MICROSCOPIC
Bacteria, UA: NONE SEEN
Bilirubin Urine: NEGATIVE
Glucose, UA: NEGATIVE mg/dL
Hgb urine dipstick: NEGATIVE
Ketones, ur: NEGATIVE mg/dL
Leukocytes,Ua: NEGATIVE
Nitrite: NEGATIVE
Protein, ur: 30 mg/dL — AB
Specific Gravity, Urine: 1.019 (ref 1.005–1.030)
pH: 6 (ref 5.0–8.0)

## 2019-09-07 LAB — CBC WITH DIFFERENTIAL/PLATELET
Abs Immature Granulocytes: 0.05 10*3/uL (ref 0.00–0.07)
Basophils Absolute: 0.1 10*3/uL (ref 0.0–0.1)
Basophils Relative: 1 %
Eosinophils Absolute: 0.1 10*3/uL (ref 0.0–0.5)
Eosinophils Relative: 1 %
HCT: 37.4 % (ref 36.0–46.0)
Hemoglobin: 12.1 g/dL (ref 12.0–15.0)
Immature Granulocytes: 0 %
Lymphocytes Relative: 15 %
Lymphs Abs: 1.8 10*3/uL (ref 0.7–4.0)
MCH: 30 pg (ref 26.0–34.0)
MCHC: 32.4 g/dL (ref 30.0–36.0)
MCV: 92.6 fL (ref 80.0–100.0)
Monocytes Absolute: 0.8 10*3/uL (ref 0.1–1.0)
Monocytes Relative: 7 %
Neutro Abs: 9.1 10*3/uL — ABNORMAL HIGH (ref 1.7–7.7)
Neutrophils Relative %: 76 %
Platelets: 180 10*3/uL (ref 150–400)
RBC: 4.04 MIL/uL (ref 3.87–5.11)
RDW: 15 % (ref 11.5–15.5)
WBC: 11.9 10*3/uL — ABNORMAL HIGH (ref 4.0–10.5)
nRBC: 0 % (ref 0.0–0.2)

## 2019-09-07 LAB — BASIC METABOLIC PANEL
Anion gap: 10 (ref 5–15)
BUN: 24 mg/dL — ABNORMAL HIGH (ref 8–23)
CO2: 27 mmol/L (ref 22–32)
Calcium: 9.4 mg/dL (ref 8.9–10.3)
Chloride: 102 mmol/L (ref 98–111)
Creatinine, Ser: 1.23 mg/dL — ABNORMAL HIGH (ref 0.44–1.00)
GFR calc Af Amer: 49 mL/min — ABNORMAL LOW (ref >60)
GFR calc non Af Amer: 42 mL/min — ABNORMAL LOW (ref >60)
Glucose, Bld: 120 mg/dL — ABNORMAL HIGH (ref 70–99)
Potassium: 3.6 mmol/L (ref 3.5–5.1)
Sodium: 139 mmol/L (ref 135–145)

## 2019-09-07 LAB — ETHANOL: Alcohol, Ethyl (B): <10 mg/dL (ref <10)

## 2019-09-07 NOTE — Discharge Instructions (Addendum)
You have been seen in the Emergency Department (ED) today for a fall.  Your work up does not show any concerning injuries.  Please take over-the-counter ibuprofen and/or Tylenol as needed for your pain (unless you have an allergy or your doctor as told you not to take them), or take any prescribed medication as instructed.  Please change the dressing on your left elbow skin tear and use antibiotic ointment (bacitracin) to keep it moist so the bandage does not stick.  Please follow up with your doctor regarding today's Emergency Department (ED) visit and your recent fall.    Return to the ED if you have any headache, confusion, slurred speech, weakness/numbness of any arm or leg, or any increased pain.

## 2019-09-07 NOTE — ED Notes (Signed)
Pt daughter on way to pick up for d/c.

## 2019-09-07 NOTE — ED Triage Notes (Signed)
Patient coming [facility] for unwitnessed [facility] for unwitnessed fall. Patient altered per facility.

## 2019-09-07 NOTE — ED Notes (Signed)
Patient has hematoma to forehead, swelling/bruising to right eye, skin tear to left elbow.

## 2019-09-07 NOTE — ED Provider Notes (Signed)
[facility] Emergency Department Provider Note  ____________________________________________   First MD Initiated Contact with Patient 09/07/19 [phone]     (approximate)  I have reviewed the triage vital signs and the nursing notes.   HISTORY  Chief Complaint Fall  Level 5 caveat: The patient's history is limited by memory deficit.  HPI Chelsea Ruiz is a 79 y.o. female with medical history as listed below who presents for evaluation after a fall.  Reportedly she lives at [facility]  and staff checked on the patient and found that she was lower.  She has obvious bruising to the face.  She denies any other pain.  She has a small skin to her elbow.  She is unclear about what happened.  She said that her face is a little bit sore and her neck hurts.  She said that she is a bit sore around her shoulders.  She has no chest pain or shortness of breath.  She denies nausea and vomiting and abdominal pain.  She does not remember having recent dysuria.  She thinks the fall happened rather acutely and was severe but she feels okay right now other than some mild to moderate aching pain as described above.  She has no visual changes.        Past Medical History:  Diagnosis Date  . Depression   . Diabetes mellitus without complication (HCC)   . Dyspnea   . Dysrhythmia   . Elevated lipids   . Fatty liver   . Hypertension   . Hypothyroidism   . Sleep apnea   . Tremors of nervous system     Patient Active Problem List   Diagnosis Date Noted  . Hypomagnesemia 03/06/2018  . Peripheral neuropathy 03/06/2018  . 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 Chronic bilateral low back pain without sciatica (Primary Area of Pain) (L>R) 02/20/2018  . Pain in both hands (Secondary Area of Pain) (L>R) 02/20/2018  . Chronic neck pain (Tertiary Area of Pain) 02/20/2018  . Chronic pain syndrome 02/20/2018  . Long term current use of opiate analgesic 02/20/2018   . Pharmacologic therapy 02/20/2018  . Disorder of skeletal system 02/20/2018  . Problems influencing health status 02/20/2018  . Memory loss 12/12/2017  . Pedal edema 01/03/2017  . 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/27/2016  . Bradycardia, sinus 11/07/2016  . Lightheadedness 10/23/2016  . 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 Atrioventricular block, second degree 06/12/2016  . Nonrheumatic aortic valve stenosis 06/12/2016  . Chronic pain of right knee (Fourth Area of Pain) 06/05/2016  . Abnormal ECG 05/31/2016  . SOB (shortness of breath) on exertion 05/31/2016  . Sleep apnea 12/10/2015  . Wears hearing aid 12/10/2015  . Malfunction of spinal cord stimulator (HCC) 11/23/2015  . Chronic pain of multiple joints 11/11/2015  . Essential hypertension 01/27/2015  . Fatty (change of) liver, not elsewhere classified 01/27/2015  . Hyperlipidemia 01/27/2015  . Thyroid disease 01/27/2015  . Type 2 diabetes mellitus with stage 3 chronic kidney disease, without long-term current use of insulin (HCC) 01/27/2015  . Difficulty walking 11/26/2013  . Foot drop, left foot 11/26/2013  . Unspecified abnormalities of gait and mobility 11/26/2013  . Other closed fractures of distal end of radius (alone) 10/15/2013  . Depression 01/31/2012  . Biomechanical lesion, unspecified 09/19/2011  . Other secondary scoliosis, lumbar region 08/30/2011  . Spinal stenosis, lumbar region without neurogenic claudication 08/30/2011  . Chronic kidney disease (CKD), stage III (  moderate) 04/25/2011  . Chronic venous insufficiency 03/13/2011  . Mitral valve prolapse 03/13/2011  . Fibromyalgia 02/08/2011  . Other cervical disc degeneration, unspecified cervical region 02/08/2011  . 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 Osteoarthritis 12/02/2010    Past Surgical History:  Procedure Laterality Date  . BACK SURGERY    . PACEMAKER INSERTION N/A 11/16/2016   Procedure: INSERTION  PACEMAKER;  Surgeon: Marcina Millard, MD;  Location: [facility] ORS;  Service: Cardiovascular;  Laterality: N/A;  . TEAR DUCT PROBING    . total knee Bilateral     Prior to Admission medications   Medication Sig Start Date End Date Taking? Authorizing Provider  amoxicillin (AMOXIL) 500 MG capsule Take 2,000 mg See admin instructions by mouth. Take 2000 mg by mouth 1 hour prior to dental appointment    [provider]  aspirin EC 81 MG tablet Take 81 mg daily by mouth.    [provider]  atorvastatin (LIPITOR) 80 MG tablet Take 40 mg daily by mouth. 09/30/16   [provider]  buPROPion (WELLBUTRIN XL) 150 MG 24 hr tablet Take 150 mg daily by mouth. 11/04/16   [provider]  donepezil (ARICEPT) 5 MG tablet Take 5 mg by mouth daily. 12/05/17   [provider]  DULoxetine (CYMBALTA) 60 MG capsule Take 60 mg daily by mouth. 10/09/16   [provider]  hydrochlorothiazide (HYDRODIURIL) 12.5 MG tablet Take 12.5 mg daily by mouth. 10/23/16   [provider]  levothyroxine (SYNTHROID, LEVOTHROID) 75 MCG tablet Take 75 mcg daily by mouth. 08/28/16   [provider]  lisinopril (PRINIVIL,ZESTRIL) 30 MG tablet Take 30 mg daily by mouth. 10/23/16   [provider]  Magnesium 500 MG CAPS Take 1 capsule (500 mg total) by mouth daily. 03/06/18 09/02/18  Delano Metz, MD  metFORMIN (GLUCOPHAGE) 500 MG tablet Take 500 mg 2 (two) times daily by mouth. 10/10/16   [provider]  Multiple Vitamins-Minerals (MULTIVITAMIN PO) Take 1 tablet daily by mouth.    [provider]  oxymorphone (OPANA) 5 MG tablet Take 5 mg every 8 (eight) hours as needed by mouth for pain.  10/25/16   [provider]  pioglitazone (ACTOS) 15 MG tablet Take 15 mg daily by mouth. 10/23/16   [provider]  vitamin B-12 (CYANOCOBALAMIN) 100 MCG tablet Take 100 mcg by mouth daily.    [provider]     Allergies Methadone, Morphine and related, and Oxycodone  No family history on file.  Social History Social History   Tobacco Use  . Smoking status: Never Smoker  . Smokeless tobacco: Never Used  Vaping Use  . Vaping Use: Never used  Substance Use Topics  . Alcohol use: No  . Drug use: No    Review of Systems Constitutional: No fever/chills Eyes: No visual changes. ENT: No sore throat. Cardiovascular: Denies chest pain. Respiratory: Denies shortness of breath. Gastrointestinal: No abdominal pain.  No nausea, no vomiting.  No diarrhea.  No constipation. Genitourinary: Negative for dysuria. Musculoskeletal: Facial contusion. Negative for neck pain.  Negative for back pain. Integumentary: Skin tear on left elbow. Neurological: Negative for headaches, focal weakness or numbness.   ____________________________________________   PHYSICAL EXAM:  VITAL SIGNS: ED Triage Vitals  Enc Vitals Group     BP 09/07/19 0529 134/84     Pulse Rate 09/07/19 0529 90     Resp 09/07/19 0529 15     Temp 09/07/19 0529 97.8 F (36.6 C)  Temp src --      SpO2 09/07/19 0529 99 %     Weight 09/07/19 0525 88 kg (194 lb 0.1 oz)     Height 09/07/19 0525 1.575 m (5\' 2" )     Head Circumference --      Peak Flow --      Pain Score --      Pain Loc --      Pain Edu? --      Excl. in GC? --     Constitutional: Alert and oriented to person and place.   Eyes: Conjunctivae are somewhat injected bilaterally but without any visible some conjunctival hemorrhages at this time and no evidence of globe injury.  Extraocular motion is intact. Head: Ecchymosis and contusion mostly to the left side of her forehead and around the left eye but also some evidence of bruising to the right side of her face and cheek. Nose: No congestion/rhinnorhea.  No epistaxis. Mouth/Throat: Patient is wearing a mask. Neck: No stridor.  No meningeal signs.   Cardiovascular: Normal rate, regular rhythm. Good  peripheral circulation. Grossly normal heart sounds.  Pacemaker is in place in the left upper part of her chest. Respiratory: Normal respiratory effort.  No retractions. Gastrointestinal: Soft and nontender. No distention.  Musculoskeletal: No lower extremity tenderness nor edema. No gross deformities of extremities.  Passive and active range of motion of arms and legs without any evidence of fracture or dislocation.   Neurologic:  Normal speech and language. No gross focal neurologic deficits are appreciated.  Skin:  Skin is warm, dry and intact. Psychiatric: Mood and affect are normal. Speech and behavior are normal.  ____________________________________________   LABS (all labs ordered are listed, but only abnormal results are displayed)  Labs Reviewed  BASIC METABOLIC PANEL - Abnormal; Notable for the following components:      Result Value   Glucose, Bld 120 (*)    BUN 24 (*)    Creatinine, Ser 1.23 (*)    GFR calc non Af Amer 42 (*)    GFR calc Af Amer 49 (*)    All other components within normal limits  CBC WITH DIFFERENTIAL/PLATELET - Abnormal; Notable for the following components:   WBC 11.9 (*)    Neutro Abs 9.1 (*)    All other components within normal limits  URINALYSIS, ROUTINE W REFLEX MICROSCOPIC - Abnormal; Notable for the following components:   Color, Urine YELLOW (*)    APPearance CLEAR (*)    Protein, ur 30 (*)    All other components within normal limits  ETHANOL   ____________________________________________  EKG  No indication for emergent EKG ____________________________________________  RADIOLOGY I, Loleta Roseory Dalayza Zambrana, personally viewed and evaluated these images (plain radiographs) as part of my medical decision making, as well as reviewing the written report by the radiologist.  ED MD interpretation: No acute or emergent injuries noted on CT head, maxillofacial, nor C-spine.  Official radiology report(s): CT Head Wo Contrast  Result Date:  09/07/2019 CLINICAL DATA:  Unwitnessed fall, forehead hematoma EXAM: CT HEAD WITHOUT CONTRAST CT MAXILLOFACIAL WITHOUT CONTRAST CT CERVICAL SPINE WITHOUT CONTRAST TECHNIQUE: Multidetector CT imaging of the head, cervical spine, and maxillofacial structures were performed using the standard protocol without intravenous contrast. Multiplanar CT image reconstructions of the cervical spine and maxillofacial structures were also generated. COMPARISON:  CT head dated 08/15/2019. CT cervical spine dated 11/13/2017. FINDINGS: CT HEAD FINDINGS Brain: No evidence of acute infarction, hemorrhage, hydrocephalus, extra-axial collection or mass lesion/mass effect. Subcortical  white matter and periventricular small vessel ischemic changes. Vascular: Intracranial atherosclerosis. Skull: Normal. Negative for fracture or focal lesion. Other: Mild soft tissue swelling overlying the left frontal bone (series 3/image 12). CT MAXILLOFACIAL FINDINGS Motion degraded images. Osseous: No evidence of maxillofacial fracture. Mandible is intact. The bilateral mandibular condyles are well-seated in the TMJs. Orbits: Bilateral orbits, including the globes and retroconal soft tissues, are within normal limits. Sinuses: Right frontal sinus is not pneumatized. Visualized paranasal sinuses and mastoid air cells are otherwise clear. Soft tissues: Mild soft tissue swelling overlying the left frontal bone. CT CERVICAL SPINE FINDINGS Alignment: Reversal of the normal cervical lordosis. 4 mm anterolisthesis of C3 on C4, chronic. Skull base and vertebrae: No acute fracture. No primary bone lesion or focal pathologic process. Soft tissues and spinal canal: No prevertebral fluid or swelling. No visible canal hematoma. Disc levels: Mild to moderate degenerative changes of the mid cervical spine. Spinal canal is patent. Upper chest: Lung apices are clear. Other: Visualized thyroid is unremarkable. IMPRESSION: Mild soft tissue swelling overlying the left frontal  bone. No evidence of maxillofacial fracture. No evidence of acute intracranial abnormality. Small vessel ischemic changes. No evidence of traumatic injury to the cervical spine. Mild to moderate degenerative changes. Electronically Signed   By: Charline Bills M.D.   On: 09/07/2019 06:35   CT Cervical Spine Wo Contrast  Result Date: 09/07/2019 CLINICAL DATA:  Unwitnessed fall, forehead hematoma EXAM: CT HEAD WITHOUT CONTRAST CT MAXILLOFACIAL WITHOUT CONTRAST CT CERVICAL SPINE WITHOUT CONTRAST TECHNIQUE: Multidetector CT imaging of the head, cervical spine, and maxillofacial structures were performed using the standard protocol without intravenous contrast. Multiplanar CT image reconstructions of the cervical spine and maxillofacial structures were also generated. COMPARISON:  CT head dated 08/15/2019. CT cervical spine dated 11/13/2017. FINDINGS: CT HEAD FINDINGS Brain: No evidence of acute infarction, hemorrhage, hydrocephalus, extra-axial collection or mass lesion/mass effect. Subcortical white matter and periventricular small vessel ischemic changes. Vascular: Intracranial atherosclerosis. Skull: Normal. Negative for fracture or focal lesion. Other: Mild soft tissue swelling overlying the left frontal bone (series 3/image 12). CT MAXILLOFACIAL FINDINGS Motion degraded images. Osseous: No evidence of maxillofacial fracture. Mandible is intact. The bilateral mandibular condyles are well-seated in the TMJs. Orbits: Bilateral orbits, including the globes and retroconal soft tissues, are within normal limits. Sinuses: Right frontal sinus is not pneumatized. Visualized paranasal sinuses and mastoid air cells are otherwise clear. Soft tissues: Mild soft tissue swelling overlying the left frontal bone. CT CERVICAL SPINE FINDINGS Alignment: Reversal of the normal cervical lordosis. 4 mm anterolisthesis of C3 on C4, chronic. Skull base and vertebrae: No acute fracture. No primary bone lesion or focal pathologic  process. Soft tissues and spinal canal: No prevertebral fluid or swelling. No visible canal hematoma. Disc levels: Mild to moderate degenerative changes of the mid cervical spine. Spinal canal is patent. Upper chest: Lung apices are clear. Other: Visualized thyroid is unremarkable. IMPRESSION: Mild soft tissue swelling overlying the left frontal bone. No evidence of maxillofacial fracture. No evidence of acute intracranial abnormality. Small vessel ischemic changes. No evidence of traumatic injury to the cervical spine. Mild to moderate degenerative changes. Electronically Signed   By: Charline Bills M.D.   On: 09/07/2019 06:35   CT Maxillofacial Wo Contrast  Result Date: 09/07/2019 CLINICAL DATA:  Unwitnessed fall, forehead hematoma EXAM: CT HEAD WITHOUT CONTRAST CT MAXILLOFACIAL WITHOUT CONTRAST CT CERVICAL SPINE WITHOUT CONTRAST TECHNIQUE: Multidetector CT imaging of the head, cervical spine, and maxillofacial structures were performed using the standard protocol  without intravenous contrast. Multiplanar CT image reconstructions of the cervical spine and maxillofacial structures were also generated. COMPARISON:  CT head dated 08/15/2019. CT cervical spine dated 11/13/2017. FINDINGS: CT HEAD FINDINGS Brain: No evidence of acute infarction, hemorrhage, hydrocephalus, extra-axial collection or mass lesion/mass effect. Subcortical white matter and periventricular small vessel ischemic changes. Vascular: Intracranial atherosclerosis. Skull: Normal. Negative for fracture or focal lesion. Other: Mild soft tissue swelling overlying the left frontal bone (series 3/image 12). CT MAXILLOFACIAL FINDINGS Motion degraded images. Osseous: No evidence of maxillofacial fracture. Mandible is intact. The bilateral mandibular condyles are well-seated in the TMJs. Orbits: Bilateral orbits, including the globes and retroconal soft tissues, are within normal limits. Sinuses: Right frontal sinus is not pneumatized. Visualized  paranasal sinuses and mastoid air cells are otherwise clear. Soft tissues: Mild soft tissue swelling overlying the left frontal bone. CT CERVICAL SPINE FINDINGS Alignment: Reversal of the normal cervical lordosis. 4 mm anterolisthesis of C3 on C4, chronic. Skull base and vertebrae: No acute fracture. No primary bone lesion or focal pathologic process. Soft tissues and spinal canal: No prevertebral fluid or swelling. No visible canal hematoma. Disc levels: Mild to moderate degenerative changes of the mid cervical spine. Spinal canal is patent. Upper chest: Lung apices are clear. Other: Visualized thyroid is unremarkable. IMPRESSION: Mild soft tissue swelling overlying the left frontal bone. No evidence of maxillofacial fracture. No evidence of acute intracranial abnormality. Small vessel ischemic changes. No evidence of traumatic injury to the cervical spine. Mild to moderate degenerative changes. Electronically Signed   By: Charline Bills M.D.   On: 09/07/2019 06:35    ____________________________________________   PROCEDURES   Procedure(s) performed (including Critical Care):  .1-3 Lead EKG Interpretation Performed by: Loleta Rose, MD Authorized by: Loleta Rose, MD     Interpretation: normal     ECG rate:  90   ECG rate assessment: normal     Rhythm: paced     Ectopy: none     Conduction: normal       ____________________________________________   INITIAL IMPRESSION / MDM / ASSESSMENT AND PLAN / ED COURSE  As part of my medical decision making, I reviewed the following data within the electronic MEDICAL RECORD NUMBER Nursing notes reviewed and incorporated, Labs reviewed , Old chart reviewed and Notes from prior ED visits   Differential diagnosis includes, but is not limited to, mechanical fall, acute infection such as UTI, electrolyte or metabolic abnormality, acute intracranial bleeding.  Patient has a reassuring physical exam.  She is making jokes with me and in no acute  distress.  She has no chest pain or shortness of breath.  No focal neurological deficits.  Bruising to her face and reports of pain in her head, face, and neck so obtain CT scans but I have a low suspicion for acute emergent injury.  I will obtain basic lab work and a urinalysis to look for evidence of acute infection, metabolic or electrolyte abnormality, etc., but I anticipate discharge and return to her living facility should there be no evidence of emergent issue requiring additional treatment or hospitalization.  The patient agrees with the plan.  The patient is on the cardiac monitor to evaluate for evidence of arrhythmia and/or significant heart rate changes.     Clinical Course as of Sep 06 744  Wynelle Link Sep 07, 2019  2703 No acute abnormalities identified.  Creatinine is at baseline.  No evidence of urinary tract infection.  Very mild leukocytosis of no clinical significance.  Patient  remains awake and alert, making jokes, in no acute distress.  Will discharge back to her facility.  Gave usual and customary return precautions.   [CF]    Clinical Course User Index [CF] Loleta Rose, MD     ____________________________________________  FINAL CLINICAL IMPRESSION(S) / ED DIAGNOSES  Final diagnoses:  Fall, initial encounter  Minor head injury, initial encounter  Contusion of face, initial encounter  Skin tear of left elbow without complication, initial encounter     MEDICATIONS GIVEN DURING THIS VISIT:  Medications - No data to display   ED Discharge Orders    None      *Please note:  Chelsea Ruiz was evaluated in Emergency Department on 09/07/2019 for the symptoms described in the history of present illness. She was evaluated in the context of the global COVID-19 pandemic, which necessitated consideration that the patient might be at risk for infection with the SARS-CoV-2 virus that causes COVID-19. Institutional protocols and algorithms that pertain to the evaluation of  patients at risk for COVID-19 are in a state of rapid change based on information released by regulatory bodies including the CDC and federal and state organizations. These policies and algorithms were followed during the patient's care in the ED.  Some ED evaluations and interventions may be delayed as a result of limited staffing during and after the pandemic.*  Note:  This document was prepared using Dragon voice recognition software and may include unintentional dictation errors.   Loleta Rose, MD 09/07/19 [phone]

## 2019-09-07 NOTE — ED Notes (Signed)
Sterile gauze dressing placed over skin tear L arm.

## 2019-09-26 ENCOUNTER — Emergency Department: Payer: Medicare Other

## 2019-09-26 ENCOUNTER — Other Ambulatory Visit: Payer: Self-pay

## 2019-09-26 DIAGNOSIS — E1122 Type 2 diabetes mellitus with diabetic chronic kidney disease: Secondary | ICD-10-CM | POA: Diagnosis not present

## 2019-09-26 DIAGNOSIS — Z7984 Long term (current) use of oral hypoglycemic drugs: Secondary | ICD-10-CM | POA: Diagnosis not present

## 2019-09-26 DIAGNOSIS — Y92129 Unspecified place in nursing home as the place of occurrence of the external cause: Secondary | ICD-10-CM | POA: Diagnosis not present

## 2019-09-26 DIAGNOSIS — N183 Chronic kidney disease, stage 3 unspecified: Secondary | ICD-10-CM | POA: Diagnosis not present

## 2019-09-26 DIAGNOSIS — Z950 Presence of cardiac pacemaker: Secondary | ICD-10-CM | POA: Diagnosis not present

## 2019-09-26 DIAGNOSIS — S7002XA Contusion of left hip, initial encounter: Secondary | ICD-10-CM | POA: Insufficient documentation

## 2019-09-26 DIAGNOSIS — I129 Hypertensive chronic kidney disease with stage 1 through stage 4 chronic kidney disease, or unspecified chronic kidney disease: Secondary | ICD-10-CM | POA: Insufficient documentation

## 2019-09-26 DIAGNOSIS — F0390 Unspecified dementia without behavioral disturbance: Secondary | ICD-10-CM | POA: Insufficient documentation

## 2019-09-26 DIAGNOSIS — E039 Hypothyroidism, unspecified: Secondary | ICD-10-CM | POA: Diagnosis not present

## 2019-09-26 DIAGNOSIS — N3001 Acute cystitis with hematuria: Secondary | ICD-10-CM | POA: Diagnosis not present

## 2019-09-26 DIAGNOSIS — M542 Cervicalgia: Secondary | ICD-10-CM | POA: Diagnosis not present

## 2019-09-26 DIAGNOSIS — Z7982 Long term (current) use of aspirin: Secondary | ICD-10-CM | POA: Diagnosis not present

## 2019-09-26 DIAGNOSIS — S79912A Unspecified injury of left hip, initial encounter: Secondary | ICD-10-CM | POA: Diagnosis present

## 2019-09-26 DIAGNOSIS — Z79899 Other long term (current) drug therapy: Secondary | ICD-10-CM | POA: Insufficient documentation

## 2019-09-26 DIAGNOSIS — W19XXXA Unspecified fall, initial encounter: Secondary | ICD-10-CM | POA: Diagnosis not present

## 2019-09-26 DIAGNOSIS — Z79890 Hormone replacement therapy (postmenopausal): Secondary | ICD-10-CM | POA: Diagnosis not present

## 2019-09-26 LAB — CBC
HCT: 35.9 % — ABNORMAL LOW (ref 36.0–46.0)
Hemoglobin: 12 g/dL (ref 12.0–15.0)
MCH: 30.5 pg (ref 26.0–34.0)
MCHC: 33.4 g/dL (ref 30.0–36.0)
MCV: 91.1 fL (ref 80.0–100.0)
Platelets: 234 10*3/uL (ref 150–400)
RBC: 3.94 MIL/uL (ref 3.87–5.11)
RDW: 15.2 % (ref 11.5–15.5)
WBC: 8.2 10*3/uL (ref 4.0–10.5)
nRBC: 0 % (ref 0.0–0.2)

## 2019-09-26 LAB — BASIC METABOLIC PANEL
Anion gap: 13 (ref 5–15)
BUN: 25 mg/dL — ABNORMAL HIGH (ref 8–23)
CO2: 30 mmol/L (ref 22–32)
Calcium: 9.3 mg/dL (ref 8.9–10.3)
Chloride: 100 mmol/L (ref 98–111)
Creatinine, Ser: 1.32 mg/dL — ABNORMAL HIGH (ref 0.44–1.00)
GFR calc Af Amer: 45 mL/min — ABNORMAL LOW (ref >60)
GFR calc non Af Amer: 39 mL/min — ABNORMAL LOW (ref >60)
Glucose, Bld: 143 mg/dL — ABNORMAL HIGH (ref 70–99)
Potassium: 3.9 mmol/L (ref 3.5–5.1)
Sodium: 143 mmol/L (ref 135–145)

## 2019-09-26 LAB — TROPONIN I (HIGH SENSITIVITY)
Troponin I (High Sensitivity): 10 ng/L (ref <18)
Troponin I (High Sensitivity): 10 ng/L (ref <18)

## 2019-09-26 LAB — TYPE AND SCREEN
ABO/RH(D): O POS
Antibody Screen: NEGATIVE

## 2019-09-26 NOTE — ED Notes (Signed)
Pts daughter would like to be called if admitted or when she needs a ride home. Stephens Shire @ [phone]

## 2019-09-26 NOTE — ED Triage Notes (Signed)
Arrives via [facility] from [facility] for fall. Pt found on floor by [facility], no LOC. C/o left hip pain 2/10. CBG and VSS.

## 2019-09-26 NOTE — ED Notes (Signed)
Patient taken to bathroom by this RN and Seward Speck

## 2019-09-27 ENCOUNTER — Emergency Department
Admission: EM | Admit: 2019-09-27 | Discharge: 2019-09-27 | Disposition: A | Discharge status: Skilled Nursing Facility | Payer: Medicare Other | Attending: Emergency Medicine | Admitting: Emergency Medicine

## 2019-09-27 ENCOUNTER — Emergency Department: Payer: Medicare Other

## 2019-09-27 DIAGNOSIS — S7002XA Contusion of left hip, initial encounter: Secondary | ICD-10-CM

## 2019-09-27 DIAGNOSIS — W19XXXA Unspecified fall, initial encounter: Secondary | ICD-10-CM

## 2019-09-27 DIAGNOSIS — N3001 Acute cystitis with hematuria: Secondary | ICD-10-CM

## 2019-09-27 LAB — URINALYSIS, COMPLETE (UACMP) WITH MICROSCOPIC
Bilirubin Urine: NEGATIVE
Glucose, UA: NEGATIVE mg/dL
Hgb urine dipstick: NEGATIVE
Ketones, ur: NEGATIVE mg/dL
Nitrite: NEGATIVE
Protein, ur: 30 mg/dL — AB
Specific Gravity, Urine: 1.015 (ref 1.005–1.030)
WBC, UA: >50 WBC/hpf — ABNORMAL HIGH (ref 0–5)
pH: 7 (ref 5.0–8.0)

## 2019-09-27 MED ORDER — ACETAMINOPHEN 500 MG PO TABS
1000.0000 mg | ORAL_TABLET | Freq: Once | ORAL | Status: AC
  Administered 2019-09-27: 1000 mg via ORAL
  Filled 2019-09-27: qty 2

## 2019-09-27 MED ORDER — CEPHALEXIN 500 MG PO CAPS
500.0000 mg | ORAL_CAPSULE | Freq: Once | ORAL | Status: AC
  Administered 2019-09-27: 500 mg via ORAL
  Filled 2019-09-27: qty 1

## 2019-09-27 MED ORDER — CEPHALEXIN 500 MG PO CAPS: 500.0000 mg | ORAL_CAPSULE | Freq: Two times a day (BID) | ORAL | 0 refills | Status: AC

## 2019-09-27 MED ORDER — LORAZEPAM 2 MG/ML IJ SOLN: 1.0000 mg | Freq: Once | INTRAMUSCULAR | Status: DC

## 2019-09-27 NOTE — ED Notes (Signed)
Pt in and out cathed for urine sample. Assist of georgie, rn.

## 2019-09-27 NOTE — ED Notes (Signed)
Reviewed discharge instructions with pt's daughter Selena Batten.

## 2019-09-27 NOTE — ED Provider Notes (Signed)
[facility] Emergency Department Provider Note  ____________________________________________  Time seen: Approximately 1:03 AM  I have reviewed the triage vital signs and the nursing notes.   HISTORY  Chief Complaint No chief complaint on file.  Level 5 caveat:  Portions of the history and physical were unable to be obtained due to dementia   HPI Chelsea Ruiz is a 79 y.o. female with history of diabetes, hypertension, hypothyroidism, dementia, sick sinus syndrome status post [employer] pacemaker, chronic kidney disease who presents for evaluation after fall.   Patient was found on the floor by staff at her nursing home.  Patient does not remember falling or being found by the staff.  She is currently at baseline.  She is complaining of left hip pain.  She is unable to give any further details.  She also complaining of neck pain.  Past Medical History:  Diagnosis Date  . Depression   . Diabetes mellitus without complication (HCC)   . Dyspnea   . Dysrhythmia   . Elevated lipids   . Fatty liver   . Hypertension   . Hypothyroidism   . Sleep apnea   . Tremors of nervous system     Patient Active Problem List   Diagnosis Date Noted  . Hypomagnesemia 03/06/2018  . Peripheral neuropathy 03/06/2018  . 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 Chronic bilateral low back pain without sciatica (Primary Area of Pain) (L>R) 02/20/2018  . Pain in both hands (Secondary Area of Pain) (L>R) 02/20/2018  . Chronic neck pain (Tertiary Area of Pain) 02/20/2018  . Chronic pain syndrome 02/20/2018  . Long term current use of opiate analgesic 02/20/2018  . Pharmacologic therapy 02/20/2018  . Disorder of skeletal system 02/20/2018  . Problems influencing health status 02/20/2018  . Memory loss 12/12/2017  . Pedal edema 01/03/2017  . 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/27/2016  . Bradycardia, sinus 11/07/2016  . Lightheadedness 10/23/2016   . 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 Atrioventricular block, second degree 06/12/2016  . Nonrheumatic aortic valve stenosis 06/12/2016  . Chronic pain of right knee (Fourth Area of Pain) 06/05/2016  . Abnormal ECG 05/31/2016  . SOB (shortness of breath) on exertion 05/31/2016  . Sleep apnea 12/10/2015  . Wears hearing aid 12/10/2015  . Malfunction of spinal cord stimulator (HCC) 11/23/2015  . Chronic pain of multiple joints 11/11/2015  . Essential hypertension 01/27/2015  . Fatty (change of) liver, not elsewhere classified 01/27/2015  . Hyperlipidemia 01/27/2015  . Thyroid disease 01/27/2015  . Type 2 diabetes mellitus with stage 3 chronic kidney disease, without long-term current use of insulin (HCC) 01/27/2015  . Difficulty walking 11/26/2013  . Foot drop, left foot 11/26/2013  . Unspecified abnormalities of gait and mobility 11/26/2013  . Other closed fractures of distal end of radius (alone) 10/15/2013  . Depression 01/31/2012  . Biomechanical lesion, unspecified 09/19/2011  . Other secondary scoliosis, lumbar region 08/30/2011  . Spinal stenosis, lumbar region without neurogenic claudication 08/30/2011  . Chronic kidney disease (CKD), stage III (moderate) 04/25/2011  . Chronic venous insufficiency 03/13/2011  . Mitral valve prolapse 03/13/2011  . Fibromyalgia 02/08/2011  . Other cervical disc degeneration, unspecified cervical region 02/08/2011  . 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 Osteoarthritis 12/02/2010    Past Surgical History:  Procedure Laterality Date  . BACK SURGERY    . PACEMAKER INSERTION N/A 11/16/2016   Procedure: INSERTION PACEMAKER;  Surgeon: Marcina Millard, MD;  Location: [facility] ORS;  Service: Cardiovascular;  Laterality: N/A;  . TEAR DUCT PROBING    . total knee Bilateral     Prior to Admission medications   Medication Sig Start Date End Date Taking? Authorizing Provider  amoxicillin (AMOXIL) 500 MG capsule Take  2,000 mg See admin instructions by mouth. Take 2000 mg by mouth 1 hour prior to dental appointment    [provider]  aspirin EC 81 MG tablet Take 81 mg daily by mouth.    [provider]  atorvastatin (LIPITOR) 80 MG tablet Take 40 mg daily by mouth. 09/30/16   [provider]  buPROPion (WELLBUTRIN XL) 150 MG 24 hr tablet Take 150 mg daily by mouth. 11/04/16   [provider]  cephALEXin (KEFLEX) 500 MG capsule Take 1 capsule (500 mg total) by mouth 2 (two) times daily for 7 days. 09/27/19 10/04/19  Nita Sickle, MD  donepezil (ARICEPT) 5 MG tablet Take 5 mg by mouth daily. 12/05/17   [provider]  DULoxetine (CYMBALTA) 60 MG capsule Take 60 mg daily by mouth. 10/09/16   [provider]  hydrochlorothiazide (HYDRODIURIL) 12.5 MG tablet Take 12.5 mg daily by mouth. 10/23/16   [provider]  levothyroxine (SYNTHROID, LEVOTHROID) 75 MCG tablet Take 75 mcg daily by mouth. 08/28/16   [provider]  lisinopril (PRINIVIL,ZESTRIL) 30 MG tablet Take 30 mg daily by mouth. 10/23/16   [provider]  Magnesium 500 MG CAPS Take 1 capsule (500 mg total) by mouth daily. 03/06/18 09/02/18  Delano Metz, MD  metFORMIN (GLUCOPHAGE) 500 MG tablet Take 500 mg 2 (two) times daily by mouth. 10/10/16   [provider]  Multiple Vitamins-Minerals (MULTIVITAMIN PO) Take 1 tablet daily by mouth.    [provider]  oxymorphone (OPANA) 5 MG tablet Take 5 mg every 8 (eight) hours as needed by mouth for pain.  10/25/16   [provider]  pioglitazone (ACTOS) 15 MG tablet Take 15 mg daily by mouth. 10/23/16   [provider]  vitamin B-12 (CYANOCOBALAMIN) 100 MCG tablet Take 100 mcg by mouth daily.    [provider]    Allergies Methadone, Morphine and related, and Oxycodone  No family history on file.  Social History Social History   Tobacco Use  . Smoking status: Never Smoker  .  Smokeless tobacco: Never Used  Vaping Use  . Vaping Use: Never used  Substance Use Topics  . Alcohol use: No  . Drug use: No    Review of Systems  Constitutional: Negative for fever. Eyes: Negative for visual changes. ENT: Negative for sore throat. Neck: + neck pain  Cardiovascular: Negative for chest pain. Respiratory: Negative for shortness of breath. Gastrointestinal: Negative for abdominal pain, vomiting or diarrhea. Genitourinary: Negative for dysuria. Musculoskeletal: Negative for back pain. + L hip pain Skin: Negative for rash. Neurological: Negative for headaches, weakness or numbness. Psych: No SI or HI  ____________________________________________   PHYSICAL EXAM:  VITAL SIGNS: ED Triage Vitals  Enc Vitals Group     BP 09/26/19 1509 132/69     Pulse Rate 09/26/19 1509 100     Resp 09/26/19 1509 18     Temp 09/26/19 1509 98 F (36.7 C)     Temp src --      SpO2 09/26/19 1509 99 %     Weight 09/26/19 1507 194 lb 0.1 oz (88 kg)     Height 09/26/19 1507  (1.575 m)     Head Circumference --  Peak Flow --      Pain Score 09/26/19 1507 7     Pain Loc --      Pain Edu? --      Excl. in GC? --     Full spinal precautions maintained throughout the trauma exam. Constitutional: Alert and oriented. No acute distress. Does not appear intoxicated. HEENT Head: Normocephalic and atraumatic. Face: No facial bony tenderness. Stable midface Ears: No hemotympanum bilaterally. No Battle sign Eyes: No eye injury. PERRL. No raccoon eyes Nose: Nontender. No epistaxis. No rhinorrhea Mouth/Throat: Mucous membranes are moist. No oropharyngeal blood. No dental injury. Airway patent without stridor. Normal voice. Neck: no C-collar. No midline c-spine tenderness.  Mild bilateral paraspinal tenderness. Cardiovascular: Normal rate, regular rhythm. Normal and symmetric distal pulses are present in all extremities. Pulmonary/Chest: Chest wall is stable and nontender to  palpation/compression. Normal respiratory effort. Breath sounds are normal. No crepitus.  Abdominal: Soft, nontender, non distended. Musculoskeletal: Patient with significant tenderness with any ROM of the L hip. LLE is not shortened or rotated. Nontender with normal full range of motion in all other extremities. No deformities. No thoracic or lumbar midline spinal tenderness. Pelvis is stable. Skin: Skin is warm, dry and intact. No abrasions or contutions. Psychiatric: Speech and behavior are appropriate. Neurological: Normal speech and language. Moves all extremities to command. No gross focal neurologic deficits are appreciated.  Glascow Coma Score: 4 - Opens eyes on own 6 - Follows simple motor commands 4 - Seems confused, disoriented GCS: 14   ____________________________________________   LABS (all labs ordered are listed, but only abnormal results are displayed)  Labs Reviewed  CBC - Abnormal; Notable for the following components:      Result Value   HCT 35.9 (*)    All other components within normal limits  BASIC METABOLIC PANEL - Abnormal; Notable for the following components:   Glucose, Bld 143 (*)    BUN 25 (*)    Creatinine, Ser 1.32 (*)    GFR calc non Af Amer 39 (*)    GFR calc Af Amer 45 (*)    All other components within normal limits  URINALYSIS, COMPLETE (UACMP) WITH MICROSCOPIC - Abnormal; Notable for the following components:   Color, Urine YELLOW (*)    APPearance CLOUDY (*)    Protein, ur 30 (*)    Leukocytes,Ua MODERATE (*)    WBC, UA >50 (*)    Bacteria, UA RARE (*)    All other components within normal limits  URINE CULTURE  TYPE AND SCREEN  TROPONIN I (HIGH SENSITIVITY)  TROPONIN I (HIGH SENSITIVITY)   ____________________________________________  EKG  Paced rhythm, rate of 87, no concordant ST elevations.  Unchanged from prior. ____________________________________________  RADIOLOGY  I have personally reviewed the images performed during  this visit and I agree with the Radiologist's read.   Interpretation by Radiologist:  CT Head Wo Contrast  Result Date: 09/27/2019 CLINICAL DATA:  Fall.  Neck trauma. EXAM: CT HEAD WITHOUT CONTRAST CT CERVICAL SPINE WITHOUT CONTRAST TECHNIQUE: Multidetector CT imaging of the head and cervical spine was performed following the standard protocol without intravenous contrast. Multiplanar CT image reconstructions of the cervical spine were also generated. COMPARISON:  CT head and cervical spine 09/07/2019 FINDINGS: CT HEAD FINDINGS Brain: No evidence of acute infarction, hemorrhage, hydrocephalus, extra-axial collection or mass lesion/mass effect. Diffuse cerebral atrophy. Ventricular dilatation consistent with central atrophy. Low-attenuation changes in the deep white matter consistent with small vessel ischemia. Vascular: Intracranial arterial vascular calcifications  are present. Skull: No depressed skull fractures. Sinuses/Orbits: Paranasal sinuses and mastoid air cells are clear. There is a nasal polyp. Other: None. CT CERVICAL SPINE FINDINGS Alignment: Reversal of the usual cervical lordosis is likely degenerative. Mild anterior subluxation of C3 on C4. Alignment is unchanged since prior study. Normal alignment of the facet joints. C1-2 articulation appears intact. Skull base and vertebrae: Skull base appears intact. No vertebral compression deformities. No focal bone lesion or bone destruction. Soft tissues and spinal canal: No prevertebral soft tissue swelling. No abnormal paraspinal soft tissue mass or infiltration. Disc levels: Degenerative changes throughout the cervical spine with narrowed interspaces and endplate hypertrophic change. Degenerative changes in the facet joints. Upper chest: Lung apices are clear.  Vascular calcifications. Other: None. IMPRESSION: 1. No acute intracranial abnormalities. Chronic atrophy and small vessel ischemic changes. 2. Reversal of the usual cervical lordosis is likely  degenerative. Mild anterior subluxation of C3 on C4 is unchanged since prior study. Degenerative changes throughout the cervical spine. No acute displaced fractures identified. Electronically Signed   By: Burman Nieves M.D.   On: 09/27/2019 02:37   CT Cervical Spine Wo Contrast  Result Date: 09/27/2019 CLINICAL DATA:  Fall.  Neck trauma. EXAM: CT HEAD WITHOUT CONTRAST CT CERVICAL SPINE WITHOUT CONTRAST TECHNIQUE: Multidetector CT imaging of the head and cervical spine was performed following the standard protocol without intravenous contrast. Multiplanar CT image reconstructions of the cervical spine were also generated. COMPARISON:  CT head and cervical spine 09/07/2019 FINDINGS: CT HEAD FINDINGS Brain: No evidence of acute infarction, hemorrhage, hydrocephalus, extra-axial collection or mass lesion/mass effect. Diffuse cerebral atrophy. Ventricular dilatation consistent with central atrophy. Low-attenuation changes in the deep white matter consistent with small vessel ischemia. Vascular: Intracranial arterial vascular calcifications are present. Skull: No depressed skull fractures. Sinuses/Orbits: Paranasal sinuses and mastoid air cells are clear. There is a nasal polyp. Other: None. CT CERVICAL SPINE FINDINGS Alignment: Reversal of the usual cervical lordosis is likely degenerative. Mild anterior subluxation of C3 on C4. Alignment is unchanged since prior study. Normal alignment of the facet joints. C1-2 articulation appears intact. Skull base and vertebrae: Skull base appears intact. No vertebral compression deformities. No focal bone lesion or bone destruction. Soft tissues and spinal canal: No prevertebral soft tissue swelling. No abnormal paraspinal soft tissue mass or infiltration. Disc levels: Degenerative changes throughout the cervical spine with narrowed interspaces and endplate hypertrophic change. Degenerative changes in the facet joints. Upper chest: Lung apices are clear.  Vascular  calcifications. Other: None. IMPRESSION: 1. No acute intracranial abnormalities. Chronic atrophy and small vessel ischemic changes. 2. Reversal of the usual cervical lordosis is likely degenerative. Mild anterior subluxation of C3 on C4 is unchanged since prior study. Degenerative changes throughout the cervical spine. No acute displaced fractures identified. Electronically Signed   By: Burman Nieves M.D.   On: 09/27/2019 02:37   CT PELVIS WO CONTRAST  Result Date: 09/27/2019 CLINICAL DATA:  Left hip pain after a fall EXAM: CT PELVIS WITHOUT CONTRAST TECHNIQUE: Multidetector CT imaging of the pelvis was performed following the standard protocol without intravenous contrast. COMPARISON:  Left hip radiographs 09/27/2019 FINDINGS: Urinary Tract:  Bladder is normal. Bowel: No dilatation or inflammatory changes demonstrated in the visualized small and large bowel. Colonic diverticulosis. Vascular/Lymphatic: Vascular calcifications. No aneurysms identified. Reproductive:  Uterus and ovaries are not enlarged. Other:  Small umbilical hernia containing fat. Musculoskeletal: There is an intramuscular hematoma in the distal iliacus muscle anterior to the left hip. Degenerative changes in the  left hip. No evidence of acute fracture or dislocation of the left hip or pelvis. Degenerative changes in the SI joints without displacement. Postoperative changes at the lumbosacral spine. IMPRESSION: 1. Intramuscular hematoma in the distal iliacus muscle anterior to the left hip. 2. Degenerative changes in the left hip. No evidence of acute fracture or dislocation of the left hip or pelvis. Electronically Signed   By: Burman NievesWilliam  Stevens M.D.   On: 09/27/2019 02:40   DG Hip Unilat With Pelvis 2-3 Views Left  Result Date: 09/27/2019 CLINICAL DATA:  Left hip pain after a fall EXAM: DG HIP (WITH OR WITHOUT PELVIS) 2-3V LEFT COMPARISON:  None. FINDINGS: Mild degenerative changes in the left hip. No evidence of acute fracture or  dislocation. Visualized pelvis appears intact. Postoperative changes in the lumbosacral spine. Vascular calcifications. IMPRESSION: Mild degenerative changes. No acute fracture or dislocation. Electronically Signed   By: Burman NievesWilliam  Stevens M.D.   On: 09/27/2019 01:06      ____________________________________________   PROCEDURES  Procedure(s) performed:yes .1-3 Lead EKG Interpretation Performed by: Nita SickleVeronese, [place], MD Authorized by: Nita SickleVeronese, [place], MD     Interpretation: non-specific     ECG rate assessment: normal     Rhythm: paced     Critical Care performed:  None ____________________________________________   INITIAL IMPRESSION / ASSESSMENT AND PLAN / ED COURSE  79 y.o. female with history of diabetes, hypertension, hypothyroidism, dementia, sick sinus syndrome status post [employer] pacemaker, chronic kidney disease who presents for evaluation after being found down at her nursing home.  Patient was at baseline when found.  Does not remember the event.  Syncope versus fall.  Pacemaker was interrogated with no signs of dysrhythmias.  Patient is complaining of left hip pain and significant tenderness with any attempts on range of motion.  She is also complaining of some tenderness with paraspinal palpation of her neck.  There are no other signs of trauma.  EKG showing no acute dysrhythmias or ischemic changes.  2 high-sensitivity troponin negative.  Labs with no leukocytosis, no anemia, kidney functions at baseline, no electrolyte derangements.  X-ray of the hip visualized by me showing no fracture or dislocation, confirmed by radiology.  We will get a CT to evaluate for any signs of occult fracture.  CT head and cervical spine also visualized by me with no acute findings, confirmed by radiology.  UA concerning for UTI with no signs of sepsis.  Patient started on Keflex.  Will give Tylenol for pain and reassess after CT of the hip.  Old medical records  reviewed.  _________________________ 3:08 AM on 09/27/2019 -----------------------------------------  CT of the pelvis showing iliacus hematoma but no fracture. Discussed work up and results with patient's daughter over the phone and patient herself.  Discussed plan with both of them.  At this time she is stable for discharge back to her nursing home    _____________________________________________ Please note:  Patient was evaluated in Emergency Department today for the symptoms described in the history of present illness. Patient was evaluated in the context of the global COVID-19 pandemic, which necessitated consideration that the patient might be at risk for infection with the SARS-CoV-2 virus that causes COVID-19. Institutional protocols and algorithms that pertain to the evaluation of patients at risk for COVID-19 are in a state of rapid change based on information released by regulatory bodies including the CDC and federal and state organizations. These policies and algorithms were followed during the patient's care in the ED.  Some ED evaluations and  interventions may be delayed as a result of limited staffing during the pandemic.   [place] Controlled Substance Database was reviewed by me. ____________________________________________   FINAL CLINICAL IMPRESSION(S) / ED DIAGNOSES   Final diagnoses:  Fall, initial encounter  Acute cystitis with hematuria  Hematoma of left hip, initial encounter      NEW MEDICATIONS STARTED DURING THIS VISIT:  ED Discharge Orders         Ordered    cephALEXin (KEFLEX) 500 MG capsule  2 times daily        09/27/19 0307           Note:  This document was prepared using Dragon voice recognition software and may include unintentional dictation errors.    Don Perking, [place], MD 09/27/19 [phone]

## 2019-09-27 NOTE — ED Notes (Signed)
[facility] called to transport back to facility.

## 2019-09-29 LAB — URINE CULTURE: Culture: >=100000 — AB

## 2019-12-01 ENCOUNTER — Other Ambulatory Visit: Payer: Self-pay

## 2019-12-01 ENCOUNTER — Emergency Department: Payer: Medicare Other

## 2019-12-01 ENCOUNTER — Emergency Department
Admission: EM | Admit: 2019-12-01 | Discharge: 2019-12-01 | Disposition: A | Discharge status: Home / Self Care | Payer: Medicare Other | Attending: Emergency Medicine | Admitting: Emergency Medicine

## 2019-12-01 DIAGNOSIS — I129 Hypertensive chronic kidney disease with stage 1 through stage 4 chronic kidney disease, or unspecified chronic kidney disease: Secondary | ICD-10-CM | POA: Insufficient documentation

## 2019-12-01 DIAGNOSIS — Y92002 Bathroom of unspecified non-institutional (private) residence single-family (private) house as the place of occurrence of the external cause: Secondary | ICD-10-CM | POA: Insufficient documentation

## 2019-12-01 DIAGNOSIS — Z79899 Other long term (current) drug therapy: Secondary | ICD-10-CM | POA: Insufficient documentation

## 2019-12-01 DIAGNOSIS — E1122 Type 2 diabetes mellitus with diabetic chronic kidney disease: Secondary | ICD-10-CM | POA: Diagnosis not present

## 2019-12-01 DIAGNOSIS — S0003XA Contusion of scalp, initial encounter: Secondary | ICD-10-CM | POA: Insufficient documentation

## 2019-12-01 DIAGNOSIS — N183 Chronic kidney disease, stage 3 unspecified: Secondary | ICD-10-CM | POA: Diagnosis not present

## 2019-12-01 DIAGNOSIS — M25551 Pain in right hip: Secondary | ICD-10-CM | POA: Insufficient documentation

## 2019-12-01 DIAGNOSIS — E039 Hypothyroidism, unspecified: Secondary | ICD-10-CM | POA: Insufficient documentation

## 2019-12-01 DIAGNOSIS — E1140 Type 2 diabetes mellitus with diabetic neuropathy, unspecified: Secondary | ICD-10-CM | POA: Insufficient documentation

## 2019-12-01 DIAGNOSIS — Z950 Presence of cardiac pacemaker: Secondary | ICD-10-CM | POA: Diagnosis not present

## 2019-12-01 DIAGNOSIS — Z7982 Long term (current) use of aspirin: Secondary | ICD-10-CM | POA: Diagnosis not present

## 2019-12-01 DIAGNOSIS — W19XXXA Unspecified fall, initial encounter: Secondary | ICD-10-CM | POA: Insufficient documentation

## 2019-12-01 DIAGNOSIS — S0990XA Unspecified injury of head, initial encounter: Secondary | ICD-10-CM

## 2019-12-01 DIAGNOSIS — Z96651 Presence of right artificial knee joint: Secondary | ICD-10-CM | POA: Insufficient documentation

## 2019-12-01 DIAGNOSIS — Z7984 Long term (current) use of oral hypoglycemic drugs: Secondary | ICD-10-CM | POA: Diagnosis not present

## 2019-12-01 LAB — COMPREHENSIVE METABOLIC PANEL
ALT: 17 U/L (ref 0–44)
AST: 22 U/L (ref 15–41)
Albumin: 4 g/dL (ref 3.5–5.0)
Alkaline Phosphatase: 116 U/L (ref 38–126)
Anion gap: 10 (ref 5–15)
BUN: 22 mg/dL (ref 8–23)
CO2: 28 mmol/L (ref 22–32)
Calcium: 9.5 mg/dL (ref 8.9–10.3)
Chloride: 103 mmol/L (ref 98–111)
Creatinine, Ser: 1.21 mg/dL — ABNORMAL HIGH (ref 0.44–1.00)
GFR, Estimated: 46 mL/min — ABNORMAL LOW (ref >60)
Glucose, Bld: 127 mg/dL — ABNORMAL HIGH (ref 70–99)
Potassium: 4.1 mmol/L (ref 3.5–5.1)
Sodium: 141 mmol/L (ref 135–145)
Total Bilirubin: 0.7 mg/dL (ref 0.3–1.2)
Total Protein: 7.1 g/dL (ref 6.5–8.1)

## 2019-12-01 LAB — URINALYSIS, COMPLETE (UACMP) WITH MICROSCOPIC
Bacteria, UA: NONE SEEN
Bilirubin Urine: NEGATIVE
Glucose, UA: NEGATIVE mg/dL
Hgb urine dipstick: NEGATIVE
Ketones, ur: NEGATIVE mg/dL
Leukocytes,Ua: NEGATIVE
Nitrite: NEGATIVE
Protein, ur: 30 mg/dL — AB
Specific Gravity, Urine: 1.019 (ref 1.005–1.030)
pH: 6 (ref 5.0–8.0)

## 2019-12-01 LAB — CBC WITH DIFFERENTIAL/PLATELET
Abs Immature Granulocytes: 0.02 10*3/uL (ref 0.00–0.07)
Basophils Absolute: 0.1 10*3/uL (ref 0.0–0.1)
Basophils Relative: 1 %
Eosinophils Absolute: 0.1 10*3/uL (ref 0.0–0.5)
Eosinophils Relative: 1 %
HCT: 38.5 % (ref 36.0–46.0)
Hemoglobin: 12.1 g/dL (ref 12.0–15.0)
Immature Granulocytes: 0 %
Lymphocytes Relative: 20 %
Lymphs Abs: 1.5 10*3/uL (ref 0.7–4.0)
MCH: 28.9 pg (ref 26.0–34.0)
MCHC: 31.4 g/dL (ref 30.0–36.0)
MCV: 92.1 fL (ref 80.0–100.0)
Monocytes Absolute: 0.4 10*3/uL (ref 0.1–1.0)
Monocytes Relative: 5 %
Neutro Abs: 5.5 10*3/uL (ref 1.7–7.7)
Neutrophils Relative %: 73 %
Platelets: 225 10*3/uL (ref 150–400)
RBC: 4.18 MIL/uL (ref 3.87–5.11)
RDW: 14.3 % (ref 11.5–15.5)
WBC: 7.6 10*3/uL (ref 4.0–10.5)
nRBC: 0 % (ref 0.0–0.2)

## 2019-12-01 LAB — TROPONIN I (HIGH SENSITIVITY): Troponin I (High Sensitivity): 21 ng/L — ABNORMAL HIGH (ref <18)

## 2019-12-01 NOTE — ED Notes (Signed)
Pt with somewhat poor memory
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
## 2019-12-01 NOTE — ED Triage Notes (Signed)
Pt from [facility] via [facility] with complaint of unwitnessed fall (found on bathroom floor). Facility reports that patient has been having more frequent falls for a while now, pt previously was using walker but has now been moved to a wheelchair due to unsteady gait.   Pt unsure why she fell, does not clearly remember fall. Pt alert and oriented x4. Reports pain to right hip and back of head.   VS per EMS: 131/74 BP 113 HR 100% RA 98.8 t

## 2019-12-01 NOTE — ED Notes (Addendum)
[employer] at [phone], report given. Per facility, they are working on arranging transport and will call back shortly

## 2019-12-01 NOTE — ED Notes (Signed)
Pt oriented x3, disoriented to time. Knows it's November but think the year is "nineteen-ninety... I'm not sure."  Pt appears somewhat paranoid about care

## 2020-05-18 ENCOUNTER — Emergency Department
Admission: EM | Admit: 2020-05-18 | Discharge: 2020-05-18 | Disposition: A | Discharge status: Home / Self Care | Payer: Medicare Other | Attending: Emergency Medicine | Admitting: Emergency Medicine

## 2020-05-18 ENCOUNTER — Emergency Department: Payer: Medicare Other

## 2020-05-18 ENCOUNTER — Other Ambulatory Visit: Payer: Self-pay

## 2020-05-18 DIAGNOSIS — S0101XA Laceration without foreign body of scalp, initial encounter: Secondary | ICD-10-CM | POA: Insufficient documentation

## 2020-05-18 DIAGNOSIS — Z7984 Long term (current) use of oral hypoglycemic drugs: Secondary | ICD-10-CM | POA: Diagnosis not present

## 2020-05-18 DIAGNOSIS — N183 Chronic kidney disease, stage 3 unspecified: Secondary | ICD-10-CM | POA: Insufficient documentation

## 2020-05-18 DIAGNOSIS — M25551 Pain in right hip: Secondary | ICD-10-CM | POA: Insufficient documentation

## 2020-05-18 DIAGNOSIS — E1142 Type 2 diabetes mellitus with diabetic polyneuropathy: Secondary | ICD-10-CM | POA: Insufficient documentation

## 2020-05-18 DIAGNOSIS — W19XXXA Unspecified fall, initial encounter: Secondary | ICD-10-CM

## 2020-05-18 DIAGNOSIS — Y92129 Unspecified place in nursing home as the place of occurrence of the external cause: Secondary | ICD-10-CM | POA: Insufficient documentation

## 2020-05-18 DIAGNOSIS — Z96651 Presence of right artificial knee joint: Secondary | ICD-10-CM | POA: Diagnosis not present

## 2020-05-18 DIAGNOSIS — M542 Cervicalgia: Secondary | ICD-10-CM | POA: Insufficient documentation

## 2020-05-18 DIAGNOSIS — Z950 Presence of cardiac pacemaker: Secondary | ICD-10-CM | POA: Insufficient documentation

## 2020-05-18 DIAGNOSIS — E785 Hyperlipidemia, unspecified: Secondary | ICD-10-CM | POA: Diagnosis not present

## 2020-05-18 DIAGNOSIS — W06XXXA Fall from bed, initial encounter: Secondary | ICD-10-CM | POA: Insufficient documentation

## 2020-05-18 DIAGNOSIS — I129 Hypertensive chronic kidney disease with stage 1 through stage 4 chronic kidney disease, or unspecified chronic kidney disease: Secondary | ICD-10-CM | POA: Insufficient documentation

## 2020-05-18 DIAGNOSIS — Z79899 Other long term (current) drug therapy: Secondary | ICD-10-CM | POA: Insufficient documentation

## 2020-05-18 DIAGNOSIS — E039 Hypothyroidism, unspecified: Secondary | ICD-10-CM | POA: Diagnosis not present

## 2020-05-18 DIAGNOSIS — E1169 Type 2 diabetes mellitus with other specified complication: Secondary | ICD-10-CM | POA: Diagnosis not present

## 2020-05-18 DIAGNOSIS — S0990XA Unspecified injury of head, initial encounter: Secondary | ICD-10-CM | POA: Diagnosis present

## 2020-05-18 DIAGNOSIS — E1122 Type 2 diabetes mellitus with diabetic chronic kidney disease: Secondary | ICD-10-CM | POA: Diagnosis not present

## 2020-05-18 DIAGNOSIS — Z7982 Long term (current) use of aspirin: Secondary | ICD-10-CM | POA: Insufficient documentation

## 2020-05-18 DIAGNOSIS — F0390 Unspecified dementia without behavioral disturbance: Secondary | ICD-10-CM | POA: Insufficient documentation

## 2020-05-18 LAB — BASIC METABOLIC PANEL
Anion gap: 9 (ref 5–15)
BUN: 16 mg/dL (ref 8–23)
CO2: 29 mmol/L (ref 22–32)
Calcium: 9.3 mg/dL (ref 8.9–10.3)
Chloride: 104 mmol/L (ref 98–111)
Creatinine, Ser: 0.94 mg/dL (ref 0.44–1.00)
GFR, Estimated: >60 mL/min (ref >60)
Glucose, Bld: 89 mg/dL (ref 70–99)
Potassium: 3.8 mmol/L (ref 3.5–5.1)
Sodium: 142 mmol/L (ref 135–145)

## 2020-05-18 LAB — CBC WITH DIFFERENTIAL/PLATELET
Abs Immature Granulocytes: 0.01 10*3/uL (ref 0.00–0.07)
Basophils Absolute: 0.1 10*3/uL (ref 0.0–0.1)
Basophils Relative: 1 %
Eosinophils Absolute: 0.2 10*3/uL (ref 0.0–0.5)
Eosinophils Relative: 4 %
HCT: 37.6 % (ref 36.0–46.0)
Hemoglobin: 12 g/dL (ref 12.0–15.0)
Immature Granulocytes: 0 %
Lymphocytes Relative: 31 %
Lymphs Abs: 1.7 10*3/uL (ref 0.7–4.0)
MCH: 28.7 pg (ref 26.0–34.0)
MCHC: 31.9 g/dL (ref 30.0–36.0)
MCV: 90 fL (ref 80.0–100.0)
Monocytes Absolute: 0.3 10*3/uL (ref 0.1–1.0)
Monocytes Relative: 6 %
Neutro Abs: 3.1 10*3/uL (ref 1.7–7.7)
Neutrophils Relative %: 58 %
Platelets: 201 10*3/uL (ref 150–400)
RBC: 4.18 MIL/uL (ref 3.87–5.11)
RDW: 15.8 % — ABNORMAL HIGH (ref 11.5–15.5)
WBC: 5.4 10*3/uL (ref 4.0–10.5)
nRBC: 0 % (ref 0.0–0.2)

## 2020-05-18 NOTE — ED Notes (Signed)
Dr Jessup at bedside 

## 2020-05-18 NOTE — ED Notes (Signed)
This RN assumed patient care at this time from [facility], pt in [place].

## 2020-05-18 NOTE — ED Triage Notes (Signed)
Pt BIBA via [facility] from [facility] c/o of unwitnessed fall. Pt has dementia at baseline. Pt present with laceration to top of forehead.
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
## 2020-05-18 NOTE — ED Provider Notes (Signed)
[facility] Emergency Department Provider Note   ____________________________________________   Event Date/Time   First MD Initiated Contact with Patient 05/18/20 0630     (approximate)  I have reviewed the triage vital signs and the nursing notes.   HISTORY  Chief Complaint Fall    HPI Chelsea Ruiz is a 80 y.o. female with past medical history of dementia, hypertension, hyperlipidemia, diabetes who presents to the ED for fall.  Per EMS, patient had an unwitnessed fall at [facility] nursing facility.  Patient states that she rolled out of bed and struck her head, is unsure whether she lost consciousness.  EMS reports patient is at her baseline mental status, but there was significant bleeding noted from a laceration to her frontal scalp.  Patient does not take any blood thinners and denies any numbness or weakness.  She does complain of headache, neck pain, and pain at her right hip.  She denies any chest pain, abdominal pain, or upper extremity pain.        Past Medical History:  Diagnosis Date  . Depression   . Diabetes mellitus without complication (HCC)   . Dyspnea   . Dysrhythmia   . Elevated lipids   . Fatty liver   . Hypertension   . Hypothyroidism   . Sleep apnea   . Tremors of nervous system     Patient Active Problem List   Diagnosis Date Noted  . Hypomagnesemia 03/06/2018  . Peripheral neuropathy 03/06/2018  . 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 Chronic bilateral low back pain without sciatica (Primary Area of Pain) (L>R) 02/20/2018  . Pain in both hands (Secondary Area of Pain) (L>R) 02/20/2018  . Chronic neck pain (Tertiary Area of Pain) 02/20/2018  . Chronic pain syndrome 02/20/2018  . Long term current use of opiate analgesic 02/20/2018  . Pharmacologic therapy 02/20/2018  . Disorder of skeletal system 02/20/2018  . Problems influencing health status 02/20/2018  . Memory loss 12/12/2017  . Pedal  edema 01/03/2017  . 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/27/2016  . Bradycardia, sinus 11/07/2016  . Lightheadedness 10/23/2016  . 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 Atrioventricular block, second degree 06/12/2016  . Nonrheumatic aortic valve stenosis 06/12/2016  . Chronic pain of right knee (Fourth Area of Pain) 06/05/2016  . Abnormal ECG 05/31/2016  . SOB (shortness of breath) on exertion 05/31/2016  . Sleep apnea 12/10/2015  . Wears hearing aid 12/10/2015  . Malfunction of spinal cord stimulator (HCC) 11/23/2015  . Chronic pain of multiple joints 11/11/2015  . Essential hypertension 01/27/2015  . Fatty (change of) liver, not elsewhere classified 01/27/2015  . Hyperlipidemia 01/27/2015  . Thyroid disease 01/27/2015  . Type 2 diabetes mellitus with stage 3 chronic kidney disease, without long-term current use of insulin (HCC) 01/27/2015  . Difficulty walking 11/26/2013  . Foot drop, left foot 11/26/2013  . Unspecified abnormalities of gait and mobility 11/26/2013  . Other closed fractures of distal end of radius (alone) 10/15/2013  . Depression 01/31/2012  . Biomechanical lesion, unspecified 09/19/2011  . Other secondary scoliosis, lumbar region 08/30/2011  . Spinal stenosis, lumbar region without neurogenic claudication 08/30/2011  . Chronic kidney disease (CKD), stage III (moderate) (HCC) 04/25/2011  . Chronic venous insufficiency 03/13/2011  . Mitral valve prolapse 03/13/2011  . Fibromyalgia 02/08/2011  . Other cervical disc degeneration, unspecified cervical region 02/08/2011  . 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 Osteoarthritis 12/02/2010    Past Surgical  History:  Procedure Laterality Date  . BACK SURGERY    . PACEMAKER INSERTION N/A 11/16/2016   Procedure: INSERTION PACEMAKER;  Surgeon: Marcina Millard, MD;  Location: [facility] ORS;  Service: Cardiovascular;  Laterality: N/A;  . TEAR DUCT PROBING    . total knee Bilateral      Prior to Admission medications   Medication Sig Start Date End Date Taking? Authorizing Provider  amoxicillin (AMOXIL) 500 MG capsule Take 2,000 mg See admin instructions by mouth. Take 2000 mg by mouth 1 hour prior to dental appointment    [provider]  aspirin EC 81 MG tablet Take 81 mg daily by mouth.    [provider]  atorvastatin (LIPITOR) 80 MG tablet Take 40 mg daily by mouth. 09/30/16   [provider]  buPROPion (WELLBUTRIN XL) 150 MG 24 hr tablet Take 150 mg daily by mouth. 11/04/16   [provider]  donepezil (ARICEPT) 5 MG tablet Take 5 mg by mouth daily. 12/05/17   [provider]  DULoxetine (CYMBALTA) 60 MG capsule Take 60 mg daily by mouth. 10/09/16   [provider]  hydrochlorothiazide (HYDRODIURIL) 12.5 MG tablet Take 12.5 mg daily by mouth. 10/23/16   [provider]  levothyroxine (SYNTHROID, LEVOTHROID) 75 MCG tablet Take 75 mcg daily by mouth. 08/28/16   [provider]  lisinopril (PRINIVIL,ZESTRIL) 30 MG tablet Take 30 mg daily by mouth. 10/23/16   [provider]  Magnesium 500 MG CAPS Take 1 capsule (500 mg total) by mouth daily. 03/06/18 09/02/18  Delano Metz, MD  metFORMIN (GLUCOPHAGE) 500 MG tablet Take 500 mg 2 (two) times daily by mouth. 10/10/16   [provider]  Multiple Vitamins-Minerals (MULTIVITAMIN PO) Take 1 tablet daily by mouth.    [provider]  oxymorphone (OPANA) 5 MG tablet Take 5 mg every 8 (eight) hours as needed by mouth for pain.  10/25/16   [provider]  pioglitazone (ACTOS) 15 MG tablet Take 15 mg daily by mouth. 10/23/16   [provider]  vitamin B-12 (CYANOCOBALAMIN) 100 MCG tablet Take 100 mcg by mouth daily.    [provider]    Allergies Methadone, Morphine and related, and Oxycodone  History reviewed. No pertinent family history.  Social History Social History   Tobacco Use  . Smoking  status: Never Smoker  . Smokeless tobacco: Never Used  Vaping Use  . Vaping Use: Never used  Substance Use Topics  . Alcohol use: No  . Drug use: No    Review of Systems  Constitutional: No fever/chills Eyes: No visual changes. ENT: No sore throat. Cardiovascular: Denies chest pain. Respiratory: Denies shortness of breath. Gastrointestinal: No abdominal pain.  No nausea, no vomiting.  No diarrhea.  No constipation. Genitourinary: Negative for dysuria. Musculoskeletal: Negative for back pain. Skin: Negative for rash.  Positive for laceration. Neurological: Positive for headaches, negative for focal weakness or numbness.  ____________________________________________   PHYSICAL EXAM:  VITAL SIGNS: ED Triage Vitals  Enc Vitals Group     BP      Pulse      Resp      Temp      Temp src      SpO2      Weight      Height      Head Circumference      Peak Flow      Pain Score      Pain Loc      Pain Edu?  Excl. in GC?     Constitutional: Alert and oriented. Eyes: Conjunctivae are normal. Head: Superficial frontal scalp laceration with no hematomas or bony step-offs. Nose: No congestion/rhinnorhea. Mouth/Throat: Mucous membranes are moist. Neck: Normal ROM, midline cervical spine tenderness to palpation noted. Cardiovascular: Normal rate, regular rhythm. Grossly normal heart sounds. Respiratory: Normal respiratory effort.  No retractions. Lungs CTAB.  No chest wall tenderness to palpation. Gastrointestinal: Soft and nontender. No distention. Genitourinary: deferred Musculoskeletal: Diffuse tenderness noted to right hip with no left hip tenderness, bilateral knee or ankle tenderness to palpation.  No upper extremity bony tenderness to palpation. Neurologic:  Normal speech and language. No gross focal neurologic deficits are appreciated. Skin:  Skin is warm, dry and intact. No rash noted. Psychiatric: Mood and affect are normal. Speech and behavior are  normal.  ____________________________________________   LABS (all labs ordered are listed, but only abnormal results are displayed)  Labs Reviewed  CBC WITH DIFFERENTIAL/PLATELET - Abnormal; Notable for the following components:      Result Value   RDW 15.8 (*)    All other components within normal limits  BASIC METABOLIC PANEL  URINALYSIS, COMPLETE (UACMP) WITH MICROSCOPIC   ____________________________________________  EKG  ED ECG REPORT I, Chesley Noon, the attending physician, personally viewed and interpreted this ECG.   Date: 05/18/2020  EKG Time: 6:33  Rate: 81  Rhythm: normal sinus rhythm  Axis: LAD  Intervals:left bundle branch block  ST&T Change: None, negative sgarbossa   PROCEDURES  Procedure(s) performed (including Critical Care):  Procedures   ____________________________________________   INITIAL IMPRESSION / ASSESSMENT AND PLAN / ED COURSE       80 year old female with past medical history of dementia, hypertension, hyperlipidemia, diabetes who presents to the ED for unwitnessed fall out of bed this morning.  She does have a superficial laceration to her frontal scalp however this does not appear to be in need of repair.  She is not anticoagulated and is at her baseline mental status with no focal deficits.  We will further assess with CT head and cervical spine as well as x-ray of right hip.  Given unwitnessed fall we will screen EKG and labs.      ____________________________________________   FINAL CLINICAL IMPRESSION(S) / ED DIAGNOSES  Final diagnoses:  Fall, initial encounter  Laceration of scalp, initial encounter     ED Discharge Orders    None       Note:  This document was prepared using Dragon voice recognition software and may include unintentional dictation errors.   Chesley Noon, MD 05/18/20 [phone]

## 2020-05-18 NOTE — ED Provider Notes (Signed)
-----------------------------------------   8:26 AM on 05/18/2020 -----------------------------------------  I took over care of this patient from Dr. Larinda Buttery.  Lab work-up is unremarkable.  CT head and cervical spine are negative for traumatic findings.  The right hip x-ray is negative as well.  On reassessment the patient is comfortable appearing.  She is stable for discharge home.  I discussed the results of the work-up with her family member.  Return precautions provided, and she expressed understanding.   Dionne Bucy, MD 05/18/20 [phone]

## 2020-05-25 ENCOUNTER — Emergency Department: Payer: Medicare Other

## 2020-05-25 ENCOUNTER — Other Ambulatory Visit: Payer: Self-pay

## 2020-05-25 ENCOUNTER — Emergency Department
Admission: EM | Admit: 2020-05-25 | Discharge: 2020-05-25 | Disposition: A | Discharge status: Home / Self Care | Payer: Medicare Other | Attending: Emergency Medicine | Admitting: Emergency Medicine

## 2020-05-25 DIAGNOSIS — W19XXXA Unspecified fall, initial encounter: Secondary | ICD-10-CM

## 2020-05-25 DIAGNOSIS — F0390 Unspecified dementia without behavioral disturbance: Secondary | ICD-10-CM | POA: Insufficient documentation

## 2020-05-25 DIAGNOSIS — Z7984 Long term (current) use of oral hypoglycemic drugs: Secondary | ICD-10-CM | POA: Diagnosis not present

## 2020-05-25 DIAGNOSIS — E039 Hypothyroidism, unspecified: Secondary | ICD-10-CM | POA: Diagnosis not present

## 2020-05-25 DIAGNOSIS — Z79899 Other long term (current) drug therapy: Secondary | ICD-10-CM | POA: Insufficient documentation

## 2020-05-25 DIAGNOSIS — I129 Hypertensive chronic kidney disease with stage 1 through stage 4 chronic kidney disease, or unspecified chronic kidney disease: Secondary | ICD-10-CM | POA: Diagnosis not present

## 2020-05-25 DIAGNOSIS — Z96651 Presence of right artificial knee joint: Secondary | ICD-10-CM | POA: Diagnosis not present

## 2020-05-25 DIAGNOSIS — N183 Chronic kidney disease, stage 3 unspecified: Secondary | ICD-10-CM | POA: Insufficient documentation

## 2020-05-25 DIAGNOSIS — E1122 Type 2 diabetes mellitus with diabetic chronic kidney disease: Secondary | ICD-10-CM | POA: Diagnosis not present

## 2020-05-25 DIAGNOSIS — E1140 Type 2 diabetes mellitus with diabetic neuropathy, unspecified: Secondary | ICD-10-CM | POA: Diagnosis not present

## 2020-05-25 DIAGNOSIS — Z7982 Long term (current) use of aspirin: Secondary | ICD-10-CM | POA: Insufficient documentation

## 2020-05-25 LAB — COMPREHENSIVE METABOLIC PANEL
ALT: 13 U/L (ref 0–44)
AST: 15 U/L (ref 15–41)
Albumin: 3.9 g/dL (ref 3.5–5.0)
Alkaline Phosphatase: 60 U/L (ref 38–126)
Anion gap: 11 (ref 5–15)
BUN: 19 mg/dL (ref 8–23)
CO2: 28 mmol/L (ref 22–32)
Calcium: 9 mg/dL (ref 8.9–10.3)
Chloride: 102 mmol/L (ref 98–111)
Creatinine, Ser: 0.98 mg/dL (ref 0.44–1.00)
GFR, Estimated: 59 mL/min — ABNORMAL LOW (ref >60)
Glucose, Bld: 80 mg/dL (ref 70–99)
Potassium: 3.8 mmol/L (ref 3.5–5.1)
Sodium: 141 mmol/L (ref 135–145)
Total Bilirubin: 0.6 mg/dL (ref 0.3–1.2)
Total Protein: 6.7 g/dL (ref 6.5–8.1)

## 2020-05-25 LAB — CBC WITH DIFFERENTIAL/PLATELET
Abs Immature Granulocytes: 0.01 10*3/uL (ref 0.00–0.07)
Basophils Absolute: 0 10*3/uL (ref 0.0–0.1)
Basophils Relative: 1 %
Eosinophils Absolute: 0.1 10*3/uL (ref 0.0–0.5)
Eosinophils Relative: 2 %
HCT: 34.5 % — ABNORMAL LOW (ref 36.0–46.0)
Hemoglobin: 11.2 g/dL — ABNORMAL LOW (ref 12.0–15.0)
Immature Granulocytes: 0 %
Lymphocytes Relative: 26 %
Lymphs Abs: 1.7 10*3/uL (ref 0.7–4.0)
MCH: 29 pg (ref 26.0–34.0)
MCHC: 32.5 g/dL (ref 30.0–36.0)
MCV: 89.4 fL (ref 80.0–100.0)
Monocytes Absolute: 0.3 10*3/uL (ref 0.1–1.0)
Monocytes Relative: 5 %
Neutro Abs: 4.4 10*3/uL (ref 1.7–7.7)
Neutrophils Relative %: 66 %
Platelets: 227 10*3/uL (ref 150–400)
RBC: 3.86 MIL/uL — ABNORMAL LOW (ref 3.87–5.11)
RDW: 15.7 % — ABNORMAL HIGH (ref 11.5–15.5)
WBC: 6.6 10*3/uL (ref 4.0–10.5)
nRBC: 0 % (ref 0.0–0.2)

## 2020-05-25 NOTE — ED Provider Notes (Signed)
[facility] Emergency Department Provider Note  ____________________________________________   Event Date/Time   First MD Initiated Contact with Patient 05/25/20 1341     (approximate)  I have reviewed the triage vital signs and the nursing notes.   HISTORY  Chief Complaint Fall    HPI Chelsea Ruiz is a 80 y.o. female presents emergency department via EMS from [facility].  EMS states that the patient was trying to transfer from the chair to the sofa and fell.  Unsure if she hit the floor.  Patient had a fall 1 week ago which she sustained a head injury with laceration.  Patient has history of dementia at baseline.  Patient is a poor historian    Past Medical History:  Diagnosis Date  . Depression   . Diabetes mellitus without complication (HCC)   . Dyspnea   . Dysrhythmia   . Elevated lipids   . Fatty liver   . Hypertension   . Hypothyroidism   . Sleep apnea   . Tremors of nervous system     Patient Active Problem List   Diagnosis Date Noted  . Hypomagnesemia 03/06/2018  . Peripheral neuropathy 03/06/2018  . 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 Chronic bilateral low back pain without sciatica (Primary Area of Pain) (L>R) 02/20/2018  . Pain in both hands (Secondary Area of Pain) (L>R) 02/20/2018  . Chronic neck pain (Tertiary Area of Pain) 02/20/2018  . Chronic pain syndrome 02/20/2018  . Long term current use of opiate analgesic 02/20/2018  . Pharmacologic therapy 02/20/2018  . Disorder of skeletal system 02/20/2018  . Problems influencing health status 02/20/2018  . Memory loss 12/12/2017  . Pedal edema 01/03/2017  . 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/27/2016  . Bradycardia, sinus 11/07/2016  . Lightheadedness 10/23/2016  . 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 Atrioventricular block, second degree 06/12/2016  . Nonrheumatic aortic valve stenosis 06/12/2016  . Chronic pain of right knee  (Fourth Area of Pain) 06/05/2016  . Abnormal ECG 05/31/2016  . SOB (shortness of breath) on exertion 05/31/2016  . Sleep apnea 12/10/2015  . Wears hearing aid 12/10/2015  . Malfunction of spinal cord stimulator (HCC) 11/23/2015  . Chronic pain of multiple joints 11/11/2015  . Essential hypertension 01/27/2015  . Fatty (change of) liver, not elsewhere classified 01/27/2015  . Hyperlipidemia 01/27/2015  . Thyroid disease 01/27/2015  . Type 2 diabetes mellitus with stage 3 chronic kidney disease, without long-term current use of insulin (HCC) 01/27/2015  . Difficulty walking 11/26/2013  . Foot drop, left foot 11/26/2013  . Unspecified abnormalities of gait and mobility 11/26/2013  . Other closed fractures of distal end of radius (alone) 10/15/2013  . Depression 01/31/2012  . Biomechanical lesion, unspecified 09/19/2011  . Other secondary scoliosis, lumbar region 08/30/2011  . Spinal stenosis, lumbar region without neurogenic claudication 08/30/2011  . Chronic kidney disease (CKD), stage III (moderate) (HCC) 04/25/2011  . Chronic venous insufficiency 03/13/2011  . Mitral valve prolapse 03/13/2011  . Fibromyalgia 02/08/2011  . Other cervical disc degeneration, unspecified cervical region 02/08/2011  . 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 Osteoarthritis 12/02/2010    Past Surgical History:  Procedure Laterality Date  . BACK SURGERY    . PACEMAKER INSERTION N/A 11/16/2016   Procedure: INSERTION PACEMAKER;  Surgeon: Marcina Millard, MD;  Location: [facility] ORS;  Service: Cardiovascular;  Laterality: N/A;  . TEAR DUCT PROBING    . total knee Bilateral  Prior to Admission medications   Medication Sig Start Date End Date Taking? Authorizing Provider  amoxicillin (AMOXIL) 500 MG capsule Take 2,000 mg See admin instructions by mouth. Take 2000 mg by mouth 1 hour prior to dental appointment    [provider]  aspirin EC 81 MG tablet Take 81 mg daily by  mouth.    [provider]  atorvastatin (LIPITOR) 80 MG tablet Take 40 mg daily by mouth. 09/30/16   [provider]  buPROPion (WELLBUTRIN XL) 150 MG 24 hr tablet Take 150 mg daily by mouth. 11/04/16   [provider]  donepezil (ARICEPT) 5 MG tablet Take 5 mg by mouth daily. 12/05/17   [provider]  DULoxetine (CYMBALTA) 60 MG capsule Take 60 mg daily by mouth. 10/09/16   [provider]  hydrochlorothiazide (HYDRODIURIL) 12.5 MG tablet Take 12.5 mg daily by mouth. 10/23/16   [provider]  levothyroxine (SYNTHROID, LEVOTHROID) 75 MCG tablet Take 75 mcg daily by mouth. 08/28/16   [provider]  lisinopril (PRINIVIL,ZESTRIL) 30 MG tablet Take 30 mg daily by mouth. 10/23/16   [provider]  Magnesium 500 MG CAPS Take 1 capsule (500 mg total) by mouth daily. 03/06/18 09/02/18  Delano Metz, MD  metFORMIN (GLUCOPHAGE) 500 MG tablet Take 500 mg 2 (two) times daily by mouth. 10/10/16   [provider]  Multiple Vitamins-Minerals (MULTIVITAMIN PO) Take 1 tablet daily by mouth.    [provider]  oxymorphone (OPANA) 5 MG tablet Take 5 mg every 8 (eight) hours as needed by mouth for pain.  10/25/16   [provider]  pioglitazone (ACTOS) 15 MG tablet Take 15 mg daily by mouth. 10/23/16   [provider]  vitamin B-12 (CYANOCOBALAMIN) 100 MCG tablet Take 100 mcg by mouth daily.    [provider]    Allergies Methadone, Morphine and related, and Oxycodone  History reviewed. No pertinent family history.  Social History Social History   Tobacco Use  . Smoking status: Never Smoker  . Smokeless tobacco: Never Used  Vaping Use  . Vaping Use: Never used  Substance Use Topics  . Alcohol use: No  . Drug use: No    Review of Systems Patient is poor historian Constitutional: No fever/chills Eyes: No visual changes. ENT: No sore throat. Respiratory: Denies  cough Cardiovascular: Denies chest pain Gastrointestinal: Denies abdominal pain Genitourinary: Negative for dysuria. Musculoskeletal: Negative for back pain. Skin: Negative for rash. Psychiatric: no mood changes,     ____________________________________________   PHYSICAL EXAM:  VITAL SIGNS: ED Triage Vitals  Enc Vitals Group     BP 05/25/20 1333 (!) 141/85     Pulse Rate 05/25/20 1333 80     Resp 05/25/20 1333 16     Temp 05/25/20 1333 98.4 F (36.9 C)     Temp Source 05/25/20 1333 Oral     SpO2 05/25/20 1333 98 %     Weight 05/25/20 1336 139 lb 8.8 oz (63.3 kg)     Height 05/25/20 1336 5\' 2"  (1.575 m)     Head Circumference --      Peak Flow --      Pain Score 05/25/20 1336 0     Pain Loc --      Pain Edu? --      Excl. in GC? --     Constitutional: Alert and oriented. Well appearing and in no acute distress. Eyes: Conjunctivae are normal.  Head: Old bruising and healing wound  noted to the center of the forehead  nose: No congestion/rhinnorhea. Mouth/Throat: Mucous membranes are moist.   Neck:  supple no lymphadenopathy noted Cardiovascular: Normal rate, regular rhythm. Heart sounds are normal Respiratory: Normal respiratory effort.  No retractions, lungs c t a  Abd: soft nontender bs normal all 4 quad GU: deferred Musculoskeletal: FROM all extremities, warm and well perfused, slight swelling of the feet noted Neurologic:  Normal speech and language.  Skin:  Skin is warm, dry and intact. No rash noted. Psychiatric: Mood and affect are normal. Speech and behavior are normal.  ____________________________________________   LABS (all labs ordered are listed, but only abnormal results are displayed)  Labs Reviewed  COMPREHENSIVE METABOLIC PANEL - Abnormal; Notable for the following components:      Result Value   GFR, Estimated 59 (*)    All other components within normal limits  CBC WITH DIFFERENTIAL/PLATELET - Abnormal; Notable for the following components:    RBC 3.86 (*)    Hemoglobin 11.2 (*)    HCT 34.5 (*)    RDW 15.7 (*)    All other components within normal limits   ____________________________________________   ____________________________________________  RADIOLOGY  CT of the head  ____________________________________________   PROCEDURES  Procedure(s) performed: No  Procedures    ____________________________________________   INITIAL IMPRESSION / ASSESSMENT AND PLAN / ED COURSE  Pertinent labs & imaging results that were available during my care of the patient were reviewed by me and considered in my medical decision making (see chart for details).   The patient is a 80 year old female history of dementia presents from [facility] after a fall.  See HPI.  Physical exam shows patient per stable  DDx: Skull fracture, subdural, subarachnoid, frequent falls  CT of the head, CBC and metabolic panel ordered   CT of the head reviewed by me confirmed by radiology to be negative.  CBC and metabolic panel are normal  I feel that the patient is at her baseline.  She can be returned to the facility.  EMS was called for transport.  She was discharged in stable condition  Chelsea Ruiz was evaluated in Emergency Department on 05/25/2020 for the symptoms described in the history of present illness. She was evaluated in the context of the global COVID-19 pandemic, which necessitated consideration that the patient might be at risk for infection with the SARS-CoV-2 virus that causes COVID-19. Institutional protocols and algorithms that pertain to the evaluation of patients at risk for COVID-19 are in a state of rapid change based on information released by regulatory bodies including the CDC and federal and state organizations. These policies and algorithms were followed during the patient's care in the ED.    As part of my medical decision making, I reviewed the following data within the electronic MEDICAL RECORD NUMBER Nursing  notes reviewed and incorporated, Labs reviewed , Old chart reviewed, Radiograph reviewed , Notes from prior ED visits and [place] Controlled Substance Database  ____________________________________________   FINAL CLINICAL IMPRESSION(S) / ED DIAGNOSES  Final diagnoses:  Fall, initial encounter      NEW MEDICATIONS STARTED DURING THIS VISIT:  Discharge Medication List as of 05/25/2020  3:12 PM       Note:  This document was prepared using Dragon voice recognition software and may include unintentional dictation errors.    Faythe Ghee, PA-C 05/25/20 1919    Sharyn Creamer, MD 05/27/20 1739

## 2020-05-25 NOTE — ED Notes (Signed)
PA at bedside.

## 2020-05-25 NOTE — ED Triage Notes (Signed)
BIB EMS from [facility]. Fall from transfer from chair to couch. No LOC. Pt has HX of dementia and is at baseline per staff at facility.   136/83 68 HR  95 %

## 2020-11-13 ENCOUNTER — Emergency Department: Payer: Medicare Other

## 2020-11-13 ENCOUNTER — Encounter: Payer: Self-pay | Admitting: Emergency Medicine

## 2020-11-13 ENCOUNTER — Emergency Department
Admission: EM | Admit: 2020-11-13 | Discharge: 2020-11-14 | Disposition: A | Discharge status: Home / Self Care | Payer: Medicare Other | Source: Home / Self Care | Attending: Emergency Medicine | Admitting: Emergency Medicine

## 2020-11-13 ENCOUNTER — Other Ambulatory Visit: Payer: Self-pay

## 2020-11-13 DIAGNOSIS — S0083XA Contusion of other part of head, initial encounter: Secondary | ICD-10-CM

## 2020-11-13 DIAGNOSIS — Z950 Presence of cardiac pacemaker: Secondary | ICD-10-CM | POA: Insufficient documentation

## 2020-11-13 DIAGNOSIS — A419 Sepsis, unspecified organism: Secondary | ICD-10-CM | POA: Diagnosis not present

## 2020-11-13 DIAGNOSIS — W050XXA Fall from non-moving wheelchair, initial encounter: Secondary | ICD-10-CM | POA: Insufficient documentation

## 2020-11-13 DIAGNOSIS — S0990XA Unspecified injury of head, initial encounter: Secondary | ICD-10-CM | POA: Diagnosis not present

## 2020-11-13 DIAGNOSIS — Z79899 Other long term (current) drug therapy: Secondary | ICD-10-CM | POA: Insufficient documentation

## 2020-11-13 DIAGNOSIS — Z96651 Presence of right artificial knee joint: Secondary | ICD-10-CM | POA: Insufficient documentation

## 2020-11-13 DIAGNOSIS — I129 Hypertensive chronic kidney disease with stage 1 through stage 4 chronic kidney disease, or unspecified chronic kidney disease: Secondary | ICD-10-CM | POA: Insufficient documentation

## 2020-11-13 DIAGNOSIS — Z7984 Long term (current) use of oral hypoglycemic drugs: Secondary | ICD-10-CM | POA: Insufficient documentation

## 2020-11-13 DIAGNOSIS — W19XXXA Unspecified fall, initial encounter: Secondary | ICD-10-CM

## 2020-11-13 DIAGNOSIS — S060X0A Concussion without loss of consciousness, initial encounter: Secondary | ICD-10-CM | POA: Insufficient documentation

## 2020-11-13 DIAGNOSIS — N183 Chronic kidney disease, stage 3 unspecified: Secondary | ICD-10-CM | POA: Insufficient documentation

## 2020-11-13 DIAGNOSIS — Z7982 Long term (current) use of aspirin: Secondary | ICD-10-CM | POA: Insufficient documentation

## 2020-11-13 DIAGNOSIS — E1122 Type 2 diabetes mellitus with diabetic chronic kidney disease: Secondary | ICD-10-CM | POA: Insufficient documentation

## 2020-11-13 DIAGNOSIS — E039 Hypothyroidism, unspecified: Secondary | ICD-10-CM | POA: Insufficient documentation

## 2020-11-13 LAB — CBC
HCT: 36.8 % (ref 36.0–46.0)
Hemoglobin: 11.9 g/dL — ABNORMAL LOW (ref 12.0–15.0)
MCH: 30.7 pg (ref 26.0–34.0)
MCHC: 32.3 g/dL (ref 30.0–36.0)
MCV: 95.1 fL (ref 80.0–100.0)
Platelets: 251 10*3/uL (ref 150–400)
RBC: 3.87 MIL/uL (ref 3.87–5.11)
RDW: 13.4 % (ref 11.5–15.5)
WBC: 8 10*3/uL (ref 4.0–10.5)
nRBC: 0 % (ref 0.0–0.2)

## 2020-11-13 LAB — CBG MONITORING, ED: Glucose-Capillary: 178 mg/dL — ABNORMAL HIGH (ref 70–99)

## 2020-11-13 LAB — BASIC METABOLIC PANEL
Anion gap: 6 (ref 5–15)
BUN: 29 mg/dL — ABNORMAL HIGH (ref 8–23)
CO2: 30 mmol/L (ref 22–32)
Calcium: 9 mg/dL (ref 8.9–10.3)
Chloride: 105 mmol/L (ref 98–111)
Creatinine, Ser: 1.21 mg/dL — ABNORMAL HIGH (ref 0.44–1.00)
GFR, Estimated: 46 mL/min — ABNORMAL LOW (ref >60)
Glucose, Bld: 181 mg/dL — ABNORMAL HIGH (ref 70–99)
Potassium: 3.8 mmol/L (ref 3.5–5.1)
Sodium: 141 mmol/L (ref 135–145)

## 2020-11-13 NOTE — ED Provider Notes (Signed)
[facility] Emergency Department Provider Note   ____________________________________________   Event Date/Time   First MD Initiated Contact with Patient 11/13/20 2141     (approximate)  I have reviewed the triage vital signs and the nursing notes.   HISTORY  Chief Complaint Fall and Head Injury    HPI Chelsea Ruiz is a 80 y.o. female who presents via EMS from her assisted living facility after a mechanical fall forward out of her wheelchair striking her face on the carpeted floor.  Patient is unable to give any history or review of systems due to baseline mental status          Past Medical History:  Diagnosis Date   Depression    Diabetes mellitus without complication (HCC)    Dyspnea    Dysrhythmia    Elevated lipids    Fatty liver    Hypertension    Hypothyroidism    Sleep apnea    Tremors of nervous system     Patient Active Problem List   Diagnosis Date Noted   Hypomagnesemia 03/06/2018   Peripheral neuropathy 03/06/2018   Degenerative arthritis of lumbar spine 02/20/2018   Chronic bilateral low back pain without sciatica (Primary Area of Pain) (L>R) 02/20/2018   Pain in both hands (Secondary Area of Pain) (L>R) 02/20/2018   Chronic neck pain (Tertiary Area of Pain) 02/20/2018   Chronic pain syndrome 02/20/2018   Long term current use of opiate analgesic 02/20/2018   Pharmacologic therapy 02/20/2018   Disorder of skeletal system 02/20/2018   Problems influencing health status 02/20/2018   Memory loss 12/12/2017   Pedal edema 01/03/2017   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/27/2016   Bradycardia, sinus 11/07/2016   Lightheadedness 10/23/2016   S/P total knee replacement, right 06/28/2016   Atrioventricular block, second degree 06/12/2016   Nonrheumatic aortic valve stenosis 06/12/2016   Chronic pain of right knee (Fourth Area of Pain) 06/05/2016   Abnormal ECG 05/31/2016   SOB (shortness of breath) on  exertion 05/31/2016   Sleep apnea 12/10/2015   Wears hearing aid 12/10/2015   Malfunction of spinal cord stimulator (HCC) 11/23/2015   Chronic pain of multiple joints 11/11/2015   Essential hypertension 01/27/2015   Fatty (change of) liver, not elsewhere classified 01/27/2015   Hyperlipidemia 01/27/2015   Thyroid disease 01/27/2015   Type 2 diabetes mellitus with stage 3 chronic kidney disease, without long-term current use of insulin (HCC) 01/27/2015   Difficulty walking 11/26/2013   Foot drop, left foot 11/26/2013   Unspecified abnormalities of gait and mobility 11/26/2013   Other closed fractures of distal end of radius (alone) 10/15/2013   Depression 01/31/2012   Biomechanical lesion, unspecified 09/19/2011   Other secondary scoliosis, lumbar region 08/30/2011   Spinal stenosis, lumbar region without neurogenic claudication 08/30/2011   Chronic kidney disease (CKD), stage III (moderate) (HCC) 04/25/2011   Chronic venous insufficiency 03/13/2011   Mitral valve prolapse 03/13/2011   Fibromyalgia 02/08/2011   Other cervical disc degeneration, unspecified cervical region 02/08/2011   Arthropathic psoriasis, 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 Authorizing Provider  amoxicillin (AMOXIL) 500 MG capsule Take 2,000 mg See admin instructions by mouth. Take 2000 mg by mouth 1 hour prior to dental appointment    [provider]  aspirin EC 81 MG tablet Take 81 mg daily by mouth.    [provider]  atorvastatin (LIPITOR) 80 MG tablet Take 40 mg daily by mouth. 09/30/16   [provider]  buPROPion (WELLBUTRIN XL) 150 MG 24 hr tablet Take 150 mg daily by mouth. 11/04/16   [provider]  donepezil (ARICEPT) 5 MG tablet Take 5 mg by mouth daily. 12/05/17   [provider]  DULoxetine (CYMBALTA) 60 MG capsule Take 60 mg daily by mouth. 10/09/16   [provider]  hydrochlorothiazide (HYDRODIURIL) 12.5 MG tablet Take 12.5 mg daily by mouth. 10/23/16   [provider]  levothyroxine (SYNTHROID, LEVOTHROID) 75 MCG tablet Take 75 mcg daily by mouth. 08/28/16   [provider]  lisinopril (PRINIVIL,ZESTRIL) 30 MG tablet Take 30 mg daily by mouth. 10/23/16   [provider]  Magnesium 500 MG CAPS Take 1 capsule (500 mg total) by mouth daily. 03/06/18 09/02/18  Milinda Pointer, MD  metFORMIN (GLUCOPHAGE) 500 MG tablet Take 500 mg 2 (two) times daily by mouth. 10/10/16   [provider]  Multiple Vitamins-Minerals (MULTIVITAMIN PO) Take 1 tablet daily by mouth.    [provider]  oxymorphone (OPANA) 5 MG tablet Take 5 mg every 8 (eight) hours as needed by mouth for pain.  10/25/16   [provider]  pioglitazone (ACTOS) 15 MG tablet Take 15 mg daily by mouth. 10/23/16   [provider]  vitamin B-12 (CYANOCOBALAMIN) 100 MCG tablet Take 100 mcg by mouth daily.    [provider]    Allergies Methadone, Morphine and related, and Oxycodone  No family history on file.  Social History Social History   Tobacco Use   Smoking status: Never   Smokeless tobacco: Never  Vaping Use   Vaping Use: Never used  Substance Use Topics   Alcohol use: No   Drug use: No    Review of Systems Unable to assess ____________________________________________   PHYSICAL EXAM:  VITAL SIGNS: ED Triage Vitals  Enc Vitals Group     BP 11/13/20 2006 (!) 90/57     Pulse Rate 11/13/20 2006 80     Resp 11/13/20 2006 18     Temp 11/13/20 2006 98.3 F (36.8 C)     Temp Source 11/13/20 2006  Oral     SpO2 11/13/20 2006 95 %     Weight 11/13/20 2008 139 lb 8.8 oz (63.3 kg)     Height --      Head Circumference --      Peak Flow --      Pain Score --      Pain Loc --      Pain Edu? --      Excl. in [place]? --    Constitutional: Alert and disoriented. Well appearing and in no acute distress. Eyes: Conjunctivae are normal. PERRL. Head: Left forehead hematoma Nose: No congestion/rhinnorhea. Mouth/Throat: Mucous membranes are moist. Neck: No stridor Cardiovascular: Grossly normal heart sounds.  Good peripheral circulation. Respiratory: Normal respiratory effort.  No retractions. Gastrointestinal: Soft and nontender. No distention. Musculoskeletal: No obvious deformities Neurologic:  Normal speech and language. No gross focal neurologic deficits are appreciated. Skin:  Skin is warm and dry. No rash noted. Psychiatric: Mood and affect are normal.  Speech and behavior are normal.  ____________________________________________   LABS (all labs ordered are listed, but only abnormal results are displayed)  Labs Reviewed  BASIC METABOLIC PANEL - Abnormal; Notable for the following components:      Result Value   Glucose, Bld 181 (*)    BUN 29 (*)    Creatinine, Ser 1.21 (*)    GFR, Estimated 46 (*)    All other components within normal limits  CBC - Abnormal; Notable for the following components:   Hemoglobin 11.9 (*)    All other components within normal limits  CBG MONITORING, ED - Abnormal; Notable for the following components:   Glucose-Capillary 178 (*)    All other components within normal limits  URINALYSIS, ROUTINE W REFLEX MICROSCOPIC   RADIOLOGY  ED MD interpretation: CT of the head without contrast shows a left frontal scalp hematoma without any acute intracranial abnormalities or skull fracture  Official radiology report(s): CT HEAD WO CONTRAST  Result Date: 11/13/2020 CLINICAL DATA:  Fall from wheelchair. Head trauma, minor (Age >= 65y) EXAM: CT HEAD  WITHOUT CONTRAST TECHNIQUE: Contiguous axial images were obtained from the base of the skull through the vertex without intravenous contrast. COMPARISON:  Head CT 05/25/2020 FINDINGS: Brain: Stable degree of atrophy and chronic small vessel ischemia. No intracranial hemorrhage, mass effect, or midline shift. No hydrocephalus. The basilar cisterns are patent. No evidence of territorial infarct or acute ischemia. No extra-axial or intracranial fluid collection. Vascular: Atherosclerosis of skullbase vasculature without hyperdense vessel or abnormal calcification. Skull: No fracture or focal lesion. Sinuses/Orbits: Mild mucosal thickening of ethmoid air cells. Bilateral cataract resection. Mastoid air cells are clear. Other: Left frontal scalp hematoma. IMPRESSION: 1. Left frontal scalp hematoma. No acute intracranial abnormality. No skull fracture. 2. Stable atrophy and chronic small vessel ischemia. Electronically Signed   By: Keith Rake M.D.   On: 11/13/2020 21:00    ____________________________________________   PROCEDURES  Procedure(s) performed (including Critical Care):  .1-3 Lead EKG Interpretation Performed by: Naaman Plummer, MD Authorized by: Naaman Plummer, MD     Interpretation: normal     ECG rate:  75   ECG rate assessment: normal     Rhythm: sinus rhythm     Ectopy: none     Conduction: normal     ____________________________________________   INITIAL IMPRESSION / ASSESSMENT AND PLAN / ED COURSE  As part of my medical decision making, I reviewed the following data within the electronic medical record, if available:  Nursing notes reviewed and incorporated, Labs reviewed, EKG interpreted, Old chart reviewed, Radiograph reviewed and Notes from prior ED visits reviewed and incorporated        Patient presenting with head trauma.  Patient's neurological exam was non-focal and unremarkable.  [place] Head CT Rule was applied and patient did not fall into the low risk  category so a head CT was obtained.  This showed no significant findings.  At this time, it is felt that the most likely explanation for the patient's symptoms is concussion.   I also considered SAH, SDH, Epidural Hematoma, IPH, skull fracture, migraine but this appears less likely considering the data gathered thus far.   Patient provided wound care to small left forearm skin tear.   Patient remained stable and neurologically intact while in the emergency department.  Discussed warning signs that would prompt return to ED.  Head trauma handout was provided.  Discussed in detail concussion management.  No sports or strenuous activity until symptoms  free.  Return to emergency department urgently if new or worsening symptoms develop.    Impression:  Concussion Forehead hematoma  Plan  Discharge from ED Tylenol for pain control. Avoid aspirin, NSAIDs, or other blood thinners. Advised patient on supportive measures for cognitive rest - avoid use of cognitive function for at least 24 hours.  This means no tv, books, texting, computers, etc. Limit visitors to the house.  Head trauma instructions provided in discharge instructions Instructed Pt to monitor for neurologic symptoms, severe HA, change in mental status, seizures, loss of conciousness. Instructed Pt to f/up w/ PCP in 5 days or ETC should symptoms worsen or not improve. Pt verbally expressed understanding and all questions were addressed to Pt's satisfaction.      ____________________________________________   FINAL CLINICAL IMPRESSION(S) / ED DIAGNOSES  Final diagnoses:  Fall, initial encounter  Contusion of face, initial encounter     ED Discharge Orders     None        Note:  This document was prepared using Dragon voice recognition software and may include unintentional dictation errors.    Naaman Plummer, MD 11/13/20 2329

## 2020-11-13 NOTE — ED Notes (Signed)
Pt placed on cardiac, BP, pulse ox monitors. ?

## 2020-11-13 NOTE — ED Triage Notes (Signed)
Pt in from [facility] via EMS after forward fall out of her wheelchair, while trying to get to the dining room. Happened around 6:15pm. Pt's dtr present in triage, states pt is baseline, has hx of dementia and is her norm. Large hematoma present to L forehead, no thinners or LOC.

## 2020-11-13 NOTE — ED Notes (Signed)
This RN called [employer] & spoke with [place], RN

## 2020-11-13 NOTE — ED Notes (Signed)
BP 85/50 in triage

## 2020-11-14 ENCOUNTER — Inpatient Hospital Stay: Payer: Medicare Other

## 2020-11-14 ENCOUNTER — Inpatient Hospital Stay
Admission: EM | Admit: 2020-11-14 | Discharge: 2020-11-19 | DRG: 871 | Disposition: A | Discharge status: Skilled Nursing Facility | Payer: Medicare Other | Source: Skilled Nursing Facility | Attending: Internal Medicine | Admitting: Internal Medicine

## 2020-11-14 ENCOUNTER — Emergency Department: Payer: Medicare Other

## 2020-11-14 ENCOUNTER — Encounter: Payer: Self-pay | Admitting: Internal Medicine

## 2020-11-14 DIAGNOSIS — E785 Hyperlipidemia, unspecified: Secondary | ICD-10-CM | POA: Diagnosis present

## 2020-11-14 DIAGNOSIS — N1831 Chronic kidney disease, stage 3a: Secondary | ICD-10-CM

## 2020-11-14 DIAGNOSIS — I495 Sick sinus syndrome: Secondary | ICD-10-CM | POA: Diagnosis present

## 2020-11-14 DIAGNOSIS — Z66 Do not resuscitate: Secondary | ICD-10-CM | POA: Diagnosis present

## 2020-11-14 DIAGNOSIS — N1832 Chronic kidney disease, stage 3b: Secondary | ICD-10-CM | POA: Diagnosis present

## 2020-11-14 DIAGNOSIS — Z6828 Body mass index (BMI) 28.0-28.9, adult: Secondary | ICD-10-CM

## 2020-11-14 DIAGNOSIS — F03C0 Unspecified dementia, severe, without behavioral disturbance, psychotic disturbance, mood disturbance, and anxiety: Secondary | ICD-10-CM | POA: Diagnosis present

## 2020-11-14 DIAGNOSIS — A419 Sepsis, unspecified organism: Secondary | ICD-10-CM | POA: Diagnosis present

## 2020-11-14 DIAGNOSIS — K922 Gastrointestinal hemorrhage, unspecified: Secondary | ICD-10-CM | POA: Diagnosis not present

## 2020-11-14 DIAGNOSIS — E1140 Type 2 diabetes mellitus with diabetic neuropathy, unspecified: Secondary | ICD-10-CM | POA: Diagnosis present

## 2020-11-14 DIAGNOSIS — K559 Vascular disorder of intestine, unspecified: Secondary | ICD-10-CM | POA: Diagnosis present

## 2020-11-14 DIAGNOSIS — K760 Fatty (change of) liver, not elsewhere classified: Secondary | ICD-10-CM | POA: Diagnosis present

## 2020-11-14 DIAGNOSIS — R4182 Altered mental status, unspecified: Secondary | ICD-10-CM | POA: Diagnosis not present

## 2020-11-14 DIAGNOSIS — I129 Hypertensive chronic kidney disease with stage 1 through stage 4 chronic kidney disease, or unspecified chronic kidney disease: Secondary | ICD-10-CM | POA: Diagnosis present

## 2020-11-14 DIAGNOSIS — J690 Pneumonitis due to inhalation of food and vomit: Secondary | ICD-10-CM | POA: Diagnosis present

## 2020-11-14 DIAGNOSIS — W050XXA Fall from non-moving wheelchair, initial encounter: Secondary | ICD-10-CM | POA: Diagnosis present

## 2020-11-14 DIAGNOSIS — R413 Other amnesia: Secondary | ICD-10-CM | POA: Diagnosis not present

## 2020-11-14 DIAGNOSIS — I959 Hypotension, unspecified: Secondary | ICD-10-CM | POA: Diagnosis present

## 2020-11-14 DIAGNOSIS — G4733 Obstructive sleep apnea (adult) (pediatric): Secondary | ICD-10-CM | POA: Diagnosis present

## 2020-11-14 DIAGNOSIS — R40243 Glasgow coma scale score 3-8, unspecified time: Secondary | ICD-10-CM | POA: Diagnosis present

## 2020-11-14 DIAGNOSIS — F32A Depression, unspecified: Secondary | ICD-10-CM | POA: Diagnosis present

## 2020-11-14 DIAGNOSIS — Z20822 Contact with and (suspected) exposure to covid-19: Secondary | ICD-10-CM | POA: Diagnosis present

## 2020-11-14 DIAGNOSIS — E039 Hypothyroidism, unspecified: Secondary | ICD-10-CM | POA: Diagnosis present

## 2020-11-14 DIAGNOSIS — Z885 Allergy status to narcotic agent status: Secondary | ICD-10-CM

## 2020-11-14 DIAGNOSIS — I441 Atrioventricular block, second degree: Secondary | ICD-10-CM | POA: Diagnosis present

## 2020-11-14 DIAGNOSIS — Z993 Dependence on wheelchair: Secondary | ICD-10-CM

## 2020-11-14 DIAGNOSIS — L4050 Arthropathic psoriasis, unspecified: Secondary | ICD-10-CM | POA: Diagnosis present

## 2020-11-14 DIAGNOSIS — G9341 Metabolic encephalopathy: Secondary | ICD-10-CM | POA: Diagnosis present

## 2020-11-14 DIAGNOSIS — E876 Hypokalemia: Secondary | ICD-10-CM | POA: Diagnosis present

## 2020-11-14 DIAGNOSIS — Z8249 Family history of ischemic heart disease and other diseases of the circulatory system: Secondary | ICD-10-CM

## 2020-11-14 DIAGNOSIS — N183 Chronic kidney disease, stage 3 unspecified: Secondary | ICD-10-CM | POA: Diagnosis not present

## 2020-11-14 DIAGNOSIS — Z888 Allergy status to other drugs, medicaments and biological substances status: Secondary | ICD-10-CM

## 2020-11-14 DIAGNOSIS — I4510 Unspecified right bundle-branch block: Secondary | ICD-10-CM | POA: Diagnosis present

## 2020-11-14 DIAGNOSIS — Z79899 Other long term (current) drug therapy: Secondary | ICD-10-CM

## 2020-11-14 DIAGNOSIS — Z79890 Hormone replacement therapy (postmenopausal): Secondary | ICD-10-CM

## 2020-11-14 DIAGNOSIS — E1122 Type 2 diabetes mellitus with diabetic chronic kidney disease: Secondary | ICD-10-CM | POA: Diagnosis present

## 2020-11-14 DIAGNOSIS — Z7982 Long term (current) use of aspirin: Secondary | ICD-10-CM

## 2020-11-14 DIAGNOSIS — S060XAA Concussion with loss of consciousness status unknown, initial encounter: Secondary | ICD-10-CM | POA: Diagnosis present

## 2020-11-14 DIAGNOSIS — I10 Essential (primary) hypertension: Secondary | ICD-10-CM | POA: Diagnosis present

## 2020-11-14 DIAGNOSIS — Z79891 Long term (current) use of opiate analgesic: Secondary | ICD-10-CM

## 2020-11-14 DIAGNOSIS — S0990XA Unspecified injury of head, initial encounter: Secondary | ICD-10-CM | POA: Diagnosis present

## 2020-11-14 DIAGNOSIS — Z950 Presence of cardiac pacemaker: Secondary | ICD-10-CM

## 2020-11-14 DIAGNOSIS — Z800 Family history of malignant neoplasm of digestive organs: Secondary | ICD-10-CM

## 2020-11-14 DIAGNOSIS — Z7984 Long term (current) use of oral hypoglycemic drugs: Secondary | ICD-10-CM

## 2020-11-14 DIAGNOSIS — S51812A Laceration without foreign body of left forearm, initial encounter: Secondary | ICD-10-CM | POA: Diagnosis present

## 2020-11-14 LAB — TROPONIN I (HIGH SENSITIVITY)
Troponin I (High Sensitivity): 7 ng/L (ref <18)
Troponin I (High Sensitivity): 8 ng/L (ref <18)

## 2020-11-14 LAB — CBC WITH DIFFERENTIAL/PLATELET
Abs Immature Granulocytes: 0.05 10*3/uL (ref 0.00–0.07)
Basophils Absolute: 0.1 10*3/uL (ref 0.0–0.1)
Basophils Relative: 0 %
Eosinophils Absolute: 0 10*3/uL (ref 0.0–0.5)
Eosinophils Relative: 0 %
HCT: 44.9 % (ref 36.0–46.0)
Hemoglobin: 14.4 g/dL (ref 12.0–15.0)
Immature Granulocytes: 0 %
Lymphocytes Relative: 4 %
Lymphs Abs: 0.7 10*3/uL (ref 0.7–4.0)
MCH: 30.1 pg (ref 26.0–34.0)
MCHC: 32.1 g/dL (ref 30.0–36.0)
MCV: 93.9 fL (ref 80.0–100.0)
Monocytes Absolute: 1 10*3/uL (ref 0.1–1.0)
Monocytes Relative: 6 %
Neutro Abs: 14.9 10*3/uL — ABNORMAL HIGH (ref 1.7–7.7)
Neutrophils Relative %: 90 %
Platelets: 223 10*3/uL (ref 150–400)
RBC: 4.78 MIL/uL (ref 3.87–5.11)
RDW: 13.3 % (ref 11.5–15.5)
WBC: 16.6 10*3/uL — ABNORMAL HIGH (ref 4.0–10.5)
nRBC: 0 % (ref 0.0–0.2)

## 2020-11-14 LAB — URINALYSIS, ROUTINE W REFLEX MICROSCOPIC
Bilirubin Urine: NEGATIVE
Glucose, UA: NEGATIVE mg/dL
Hgb urine dipstick: NEGATIVE
Ketones, ur: NEGATIVE mg/dL
Leukocytes,Ua: NEGATIVE
Nitrite: NEGATIVE
Protein, ur: 30 mg/dL — AB
Specific Gravity, Urine: 1.023 (ref 1.005–1.030)
pH: 5 (ref 5.0–8.0)

## 2020-11-14 LAB — LACTIC ACID, PLASMA: Lactic Acid, Venous: 2.7 mmol/L (ref 0.5–1.9)

## 2020-11-14 LAB — COMPREHENSIVE METABOLIC PANEL
ALT: 20 U/L (ref 0–44)
AST: 26 U/L (ref 15–41)
Albumin: 3.6 g/dL (ref 3.5–5.0)
Alkaline Phosphatase: 95 U/L (ref 38–126)
Anion gap: 9 (ref 5–15)
BUN: 27 mg/dL — ABNORMAL HIGH (ref 8–23)
CO2: 28 mmol/L (ref 22–32)
Calcium: 9.3 mg/dL (ref 8.9–10.3)
Chloride: 100 mmol/L (ref 98–111)
Creatinine, Ser: 1.19 mg/dL — ABNORMAL HIGH (ref 0.44–1.00)
GFR, Estimated: 47 mL/min — ABNORMAL LOW (ref >60)
Glucose, Bld: 200 mg/dL — ABNORMAL HIGH (ref 70–99)
Potassium: 4.3 mmol/L (ref 3.5–5.1)
Sodium: 137 mmol/L (ref 135–145)
Total Bilirubin: 1 mg/dL (ref 0.3–1.2)
Total Protein: 7.1 g/dL (ref 6.5–8.1)

## 2020-11-14 LAB — AMMONIA: Ammonia: 14 umol/L (ref 9–35)

## 2020-11-14 MED ORDER — HYDRALAZINE HCL 20 MG/ML IJ SOLN: 5.0000 mg | INTRAMUSCULAR | Status: DC | PRN

## 2020-11-14 MED ORDER — ACETAMINOPHEN 325 MG PO TABS
650.0000 mg | ORAL_TABLET | Freq: Four times a day (QID) | ORAL | Status: DC | PRN
  Administered 2020-11-15 – 2020-11-18 (×2): 650 mg via ORAL
  Filled 2020-11-14 (×2): qty 2

## 2020-11-14 MED ORDER — ACETAMINOPHEN 650 MG RE SUPP
650.0000 mg | Freq: Four times a day (QID) | RECTAL | Status: DC | PRN
  Filled 2020-11-14: qty 1

## 2020-11-14 MED ORDER — PANTOPRAZOLE 80MG IVPB - SIMPLE MED
80.0000 mg | Freq: Once | INTRAVENOUS | Status: AC
  Administered 2020-11-14: 80 mg via INTRAVENOUS
  Filled 2020-11-14: qty 80

## 2020-11-14 MED ORDER — IOHEXOL 350 MG/ML SOLN
100.0000 mL | Freq: Once | INTRAVENOUS | Status: AC | PRN
  Administered 2020-11-14: 100 mL via INTRAVENOUS

## 2020-11-14 MED ORDER — INSULIN ASPART 100 UNIT/ML IJ SOLN: 0.0000 [IU] | Freq: Three times a day (TID) | INTRAMUSCULAR | Status: DC

## 2020-11-14 MED ORDER — ONDANSETRON HCL 4 MG/2ML IJ SOLN: 4.0000 mg | Freq: Four times a day (QID) | INTRAMUSCULAR | Status: DC | PRN

## 2020-11-14 MED ORDER — IOHEXOL 300 MG/ML  SOLN: 100.0000 mL | Freq: Once | INTRAMUSCULAR | Status: DC | PRN

## 2020-11-14 MED ORDER — ONDANSETRON HCL 4 MG PO TABS: 4.0000 mg | ORAL_TABLET | Freq: Four times a day (QID) | ORAL | Status: DC | PRN

## 2020-11-14 MED ORDER — SODIUM CHLORIDE 0.9% FLUSH
3.0000 mL | Freq: Two times a day (BID) | INTRAVENOUS | Status: DC
  Administered 2020-11-15 – 2020-11-18 (×7): 3 mL via INTRAVENOUS

## 2020-11-14 MED ORDER — SODIUM CHLORIDE 0.9 % IV SOLN: INTRAVENOUS | Status: AC

## 2020-11-14 NOTE — ED Provider Notes (Signed)
[facility] Emergency Department Provider Note   ____________________________________________   Event Date/Time   First MD Initiated Contact with Patient 11/14/20 1705     (approximate)  I have reviewed the triage vital signs and the nursing notes.   HISTORY  Chief Complaint Altered Mental Status    HPI Chelsea Ruiz is a 80 y.o. female who presents via EMS for altered mental status.  Patient's daughter is at bedside as patient has history of severe dementia.  Daughter states mother has been more somnolent than usual today after a fall from her wheelchair last night.  Daughter also noticed foul-smelling odor to her starting today as well.          Past Medical History:  Diagnosis Date   Depression    Diabetes mellitus without complication ([place])    Dyspnea    Dysrhythmia    Elevated lipids    Fatty liver    Hypertension    Hypothyroidism    Sleep apnea    Tremors of nervous system     Patient Active Problem List   Diagnosis Date Noted   Hypomagnesemia 03/06/2018   Peripheral neuropathy 03/06/2018   Degenerative arthritis of lumbar spine 02/20/2018   Chronic bilateral low back pain without sciatica (Primary Area of Pain) (L>R) 02/20/2018   Pain in both hands (Secondary Area of Pain) (L>R) 02/20/2018   Chronic neck pain (Tertiary Area of Pain) 02/20/2018   Chronic pain syndrome 02/20/2018   Long term current use of opiate analgesic 02/20/2018   Pharmacologic therapy 02/20/2018   Disorder of skeletal system 02/20/2018   Problems influencing health status 02/20/2018   Memory loss 12/12/2017   Pedal edema 01/03/2017   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/27/2016   Bradycardia, sinus 11/07/2016   Lightheadedness 10/23/2016   S/P total knee replacement, right 06/28/2016   Atrioventricular block, second degree 06/12/2016   Nonrheumatic aortic valve stenosis 06/12/2016   Chronic pain of right knee (Fourth Area of Pain)  06/05/2016   Abnormal ECG 05/31/2016   SOB (shortness of breath) on exertion 05/31/2016   Sleep apnea 12/10/2015   Wears hearing aid 12/10/2015   Malfunction of spinal cord stimulator ([place]) 11/23/2015   Chronic pain of multiple joints 11/11/2015   Essential hypertension 01/27/2015   Fatty (change of) liver, not elsewhere classified 01/27/2015   Hyperlipidemia 01/27/2015   Thyroid disease 01/27/2015   Type 2 diabetes mellitus with stage 3 chronic kidney disease, without long-term current use of insulin ([place]) 01/27/2015   Difficulty walking 11/26/2013   Foot drop, left foot 11/26/2013   Unspecified abnormalities of gait and mobility 11/26/2013   Other closed fractures of distal end of radius (alone) 10/15/2013   Depression 01/31/2012   Biomechanical lesion, unspecified 09/19/2011   Other secondary scoliosis, lumbar region 08/30/2011   Spinal stenosis, lumbar region without neurogenic claudication 08/30/2011   Chronic kidney disease (CKD), stage III (moderate) ([place]) 04/25/2011   Chronic venous insufficiency 03/13/2011   Mitral valve prolapse 03/13/2011   Fibromyalgia 02/08/2011   Other cervical disc degeneration, unspecified cervical region 02/08/2011   Arthropathic psoriasis, 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 Authorizing Provider  acetaminophen (TYLENOL) 500 MG tablet Take 1,000 mg by mouth every 6 (six) hours as needed.   Yes [provider]  aspirin EC 81 MG tablet Take 81 mg daily by mouth.   Yes [provider]  buPROPion (WELLBUTRIN XL) 150 MG 24 hr tablet Take 150 mg  daily by mouth. 11/04/16  Yes [provider]  Carboxymethylcellulose Sodium (REFRESH LIQUIGEL OP) Place 2 drops into both eyes in the morning, at noon, and at bedtime.   Yes [provider]  DULoxetine (CYMBALTA) 60 MG capsule Take 60 mg daily by mouth. 10/09/16  Yes [provider]  gabapentin (NEURONTIN) 100 MG capsule Take 100 mg by mouth daily at 12 noon.   Yes [provider]  gabapentin (NEURONTIN) 300 MG capsule Take 300 mg by mouth 3 (three) times daily.   Yes [provider]  HYDROcodone-acetaminophen (NORCO/VICODIN) 5-325 MG tablet Take 0.5 tablets by mouth 3 (three) times daily.   Yes [provider]  hydrOXYzine (ATARAX/VISTARIL) 25 MG tablet Take 25 mg by mouth 3 (three) times daily as needed.   Yes [provider]  levothyroxine (SYNTHROID, LEVOTHROID) 75 MCG tablet Take 75 mcg daily by mouth. 08/28/16  Yes [provider]  losartan (COZAAR) 50 MG tablet Take 50 mg by mouth daily.   Yes [provider]  magnesium oxide (MAG-OX) 400 MG tablet Take 400 mg by mouth 2 (two) times daily.   Yes [provider]  melatonin 3 MG TABS tablet Take 3 mg by mouth at bedtime.   Yes [provider]  risperiDONE (RISPERDAL) 1 MG tablet Take 1-1.5 mg by mouth 2 (two) times daily. Take 1 mg every morning and 1.5 mg at bedtime   Yes [provider]  traZODone (DESYREL) 50 MG tablet Take 50 mg by mouth at bedtime.   Yes [provider]  Zinc Oxide (DESITIN) 13 % CREA Apply 1 application topically daily at 12 noon.   Yes [provider]  amoxicillin (AMOXIL) 500 MG capsule Take 2,000 mg See admin instructions by mouth. Take 2000 mg by mouth 1 hour prior to dental appointment Patient not taking: No sig reported    [provider]  atorvastatin (LIPITOR) 80 MG tablet Take 40 mg daily by mouth. Patient not taking: No sig reported 09/30/16   [provider]  donepezil  (ARICEPT) 5 MG tablet Take 5 mg by mouth daily. Patient not taking: No sig reported 12/05/17   [provider]  hydrochlorothiazide (HYDRODIURIL) 12.5 MG tablet Take 12.5 mg daily by mouth. Patient not taking: No sig reported 10/23/16   [provider]  lisinopril (PRINIVIL,ZESTRIL) 30 MG tablet Take 30 mg daily by mouth. Patient not taking: No sig reported 10/23/16   [provider]  Magnesium 500 MG CAPS Take 1 capsule (500 mg total) by mouth daily. 03/06/18 09/02/18  Milinda Pointer, MD  metFORMIN (GLUCOPHAGE) 500 MG tablet Take 500 mg 2 (two) times daily by mouth. Patient not taking: No sig reported 10/10/16   [provider]  Multiple Vitamins-Minerals (MULTIVITAMIN PO) Take 1 tablet daily by mouth. Patient not taking: No sig reported    [provider]  oxymorphone (OPANA) 5 MG tablet Take 5 mg every 8 (eight) hours as needed by mouth for pain.  Patient not taking: No sig reported 10/25/16   [provider]  pioglitazone (ACTOS) 15 MG tablet Take 15 mg  daily by mouth. Patient not taking: No sig reported 10/23/16   [provider]  vitamin B-12 (CYANOCOBALAMIN) 100 MCG tablet Take 100 mcg by mouth daily. Patient not taking: No sig reported    [provider]    Allergies Methadone, Morphine and related, Oxycodone, and Lamotrigine  Family History  Problem Relation Age of Onset   Colon cancer Mother    Heart attack Father     Social History Social History   Tobacco Use   Smoking status: Never   Smokeless tobacco: Never  Vaping Use   Vaping Use: Never used  Substance Use Topics   Alcohol use: No   Drug use: No    Review of Systems Unable to assess  ____________________________________________   PHYSICAL EXAM:  VITAL SIGNS: ED Triage Vitals  Enc Vitals Group     BP 11/14/20 1700 (!) 143/105     Pulse Rate 11/14/20 1700 (!) 106     Resp 11/14/20 1700 (!) 21     Temp 11/14/20 1700 98.2 F (36.8  C)     Temp Source 11/14/20 1700 Oral     SpO2 11/14/20 1700 97 %     Weight 11/14/20 1705 157 lb 13.6 oz (71.6 kg)     Height --      Head Circumference --      Peak Flow --      Pain Score --      Pain Loc --      Pain Edu? --      Excl. in GC? --    Constitutional: Alert and disoriented. Well appearing and in no acute distress. Eyes: Conjunctivae are normal. PERRL. Head: Atraumatic. Nose: No congestion/rhinnorhea. Mouth/Throat: Mucous membranes are moist. Neck: No stridor Cardiovascular: Grossly normal heart sounds.  Good peripheral circulation. Respiratory: Normal respiratory effort.  No retractions. Gastrointestinal: Soft and nontender. No distention.  Rectal exam shows melena that is heme positive Musculoskeletal: No obvious deformities Neurologic: Moving all extremities spontaneously Skin:  Skin is warm and dry. No rash noted. Psychiatric: Cooperative  ____________________________________________   LABS (all labs ordered are listed, but only abnormal results are displayed)  Labs Reviewed  COMPREHENSIVE METABOLIC PANEL - Abnormal; Notable for the following components:      Result Value   Glucose, Bld 200 (*)    BUN 27 (*)    Creatinine, Ser 1.19 (*)    GFR, Estimated 47 (*)    All other components within normal limits  CBC WITH DIFFERENTIAL/PLATELET - Abnormal; Notable for the following components:   WBC 16.6 (*)    Neutro Abs 14.9 (*)    All other components within normal limits  URINALYSIS, ROUTINE W REFLEX MICROSCOPIC - Abnormal; Notable for the following components:   Color, Urine YELLOW (*)    APPearance CLEAR (*)    Protein, ur 30 (*)    All other components within normal limits  RESP PANEL BY RT-PCR (FLU A&B, COVID) ARPGX2  CULTURE, BLOOD (ROUTINE X 2)  CULTURE, BLOOD (ROUTINE X 2)  URINE CULTURE  AMMONIA  LACTIC ACID, PLASMA  LACTIC ACID, PLASMA  TYPE AND SCREEN  TROPONIN I (HIGH SENSITIVITY)  TROPONIN I (HIGH SENSITIVITY)    RADIOLOGY  ED  MD interpretation: CT of the head without contrast shows no evidence of acute abnormalities including no intracerebral hemorrhage, obvious masses, or significant edema  Official radiology report(s): CT HEAD WO CONTRAST ( )  Result Date: 11/14/2020 CLINICAL DATA:  Altered mental status, fall EXAM: CT HEAD WITHOUT CONTRAST TECHNIQUE:  Contiguous axial images were obtained from the base of the skull through the vertex without intravenous contrast. COMPARISON:  11/13/2020 FINDINGS: Brain: There is atrophy and chronic small vessel disease changes. No acute intracranial abnormality. Specifically, no hemorrhage, hydrocephalus, mass lesion, acute infarction, or significant intracranial injury. Vascular: No hyperdense vessel or unexpected calcification. Skull: No acute calvarial abnormality. Sinuses/Orbits: No acute findings Other: None IMPRESSION: Atrophy, chronic microvascular disease. No acute intracranial abnormality. Electronically Signed   By: Rolm Baptise M.D.   On: 11/14/2020 17:54    ____________________________________________   PROCEDURES  Procedure(s) performed (including Critical Care):  .1-3 Lead EKG Interpretation Performed by: Naaman Plummer, MD Authorized by: Naaman Plummer, MD     Interpretation: abnormal     ECG rate:  111   ECG rate assessment: tachycardic     Rhythm: sinus tachycardia     Ectopy: none     Conduction: normal    CRITICAL CARE Performed by: Naaman Plummer   Total critical care time: 31 minutes  Critical care time was exclusive of separately billable procedures and treating other patients.  Critical care was necessary to treat or prevent imminent or life-threatening deterioration.  Critical care was time spent personally by me on the following activities: development of treatment plan with patient and/or surrogate as well as nursing, discussions with consultants, evaluation of patient's response to treatment, examination of patient, obtaining history  from patient or surrogate, ordering and performing treatments and interventions, ordering and review of laboratory studies, ordering and review of radiographic studies, pulse oximetry and re-evaluation of patient's condition.  ____________________________________________   INITIAL IMPRESSION / ASSESSMENT AND PLAN / ED COURSE  As part of my medical decision making, I reviewed the following data within the electronic medical record, if available:  Nursing notes reviewed and incorporated, Labs reviewed, EKG interpreted, Old chart reviewed, Radiograph reviewed and Notes from prior ED visits reviewed and incorporated      + black stool per rectum Given history and exam patients presentation most consistent with upper GI bleed possibly secondary to peptic ulcer disease or variceal bleeding. I have low suspicion for aortoenteric fistula, ENT bleeding mimic, [place], Pulmonary bleeding mimic.  Workup: CBC, BMP, LFTs, Lipase, PT/INR, Type and Screen  Interventions: Analgesia and antiemetic medications PRN Protonix 40mg  IVP PRBC transfusion as needed  Findings: Hb: 14  Disposition: Admit for close monitoring.      ____________________________________________   FINAL CLINICAL IMPRESSION(S) / ED DIAGNOSES  Final diagnoses:  Altered mental status, unspecified altered mental status type  Gastrointestinal hemorrhage, unspecified gastrointestinal hemorrhage type     ED Discharge Orders     None        Note:  This document was prepared using Dragon voice recognition software and may include unintentional dictation errors.    Naaman Plummer, MD 11/14/20 2226

## 2020-11-14 NOTE — ED Notes (Addendum)
RN went to place In and out cath. RN observed that pt has brown/bloody stool. RN tested stool for blood and found it to be grossly positive. RN notified provider.

## 2020-11-14 NOTE — ED Triage Notes (Signed)
Pt arrives via EMS. Pt is coming in for change in mental status. Pt does have dementia. Pt was here yesterday for a sub dural hemo.

## 2020-11-14 NOTE — H&P (Addendum)
History and Physical    Chelsea Ruiz IWP:[id number] DOB: 20-Jun-1940 DOA: 11/14/2020  PCP: Orvis Brill, [facility]    Patient coming from:  [facility].    Chief Complaint:  AMS   HPI:  Chelsea Ruiz is a 80 y.o. female seen in ed with complaints of AMS, lethargic , hypertension. Pt was seen yesterday for fall eval and d/c. Today pt is lethargic and sleepy and nonverbal , diarrhea that started today, noted to have red blood in it.HPI is o/w unobtainable due to dementia. Pt is unresponsive with gcs of 3. Daughter bedside- ms karen. Pt also has colon cancer history in mom. Last scope was two years ago.   Pt has past medical history of dementia , htn, dm II, arthritis, pacemaker- dr Saralyn Pilar.  CKD stage 3.   ED Course: in ed pt is somnolent unresponsive and hypertensive. Usually her mental status is alert and awake , does no recognize her daughter.   Vitals:   11/14/20 1745 11/14/20 1800 11/14/20 1815 11/14/20 1830  BP: (!) 157/114 (!) 150/108  (!) 159/103  Pulse: (!) 104 (!) 103 (!) 104 (!) 102  Resp: $Remo'19 16 20 18  'AypRn$ Temp:      TempSrc:      SpO2: 97% 100% 95% 99%  Weight:      In ed pt meets sepsis criteria with vitals parameter and leucocytosis, although no clear source underlying infection cannot be rule out due to AMS.awaiting cta abd and pelvis for GI Bleed ? Diverticulitis masking as GIB. Lactic ordered and we will follow h/h. Pt at baseline ambulation is wheelchair bound. Pt also fell last night.  Labs show cmp with glucose of 200 and improved creatinine of 1.19, normal lft. Cbc shows 16.6 hb of 14.4 and platelet of 223.u/a negative.    Review of Systems:  Review of Systems  Unable to perform ROS: Dementia   Past Medical History:  Diagnosis Date   Depression    Diabetes mellitus without complication ([place])    Dyspnea    Dysrhythmia    Elevated lipids    Fatty liver    Hypertension    Hypothyroidism    Sleep apnea    Tremors  of nervous system     Past Surgical History:  Procedure Laterality Date   BACK SURGERY     PACEMAKER INSERTION N/A 11/16/2016   Procedure: INSERTION PACEMAKER;  Surgeon: Isaias Cowman, MD;  Location: [facility] ORS;  Service: Cardiovascular;  Laterality: N/A;   TEAR DUCT PROBING     total knee Bilateral      reports that she has never smoked. She has never used smokeless tobacco. She reports that she does not drink alcohol and does not use drugs.  Allergies  Allergen Reactions   Methadone Other (See Comments)    Hallucinations and memory   Morphine And Related Other (See Comments)    Abnormal thinking and memory loss   Oxycodone Other (See Comments)    Hallucinations and memory   Lamotrigine Palpitations    Family History  Problem Relation Age of Onset   Colon cancer Mother    Heart attack Father     Prior to Admission medications   Medication Sig Start Date End Date Taking? Authorizing Provider  acetaminophen (TYLENOL) 500 MG tablet Take 1,000 mg by mouth every 6 (six) hours as needed.   Yes [provider]  aspirin EC 81 MG tablet Take 81 mg daily by mouth.   Yes [provider]  buPROPion (WELLBUTRIN XL) 150 MG 24 hr tablet Take 150 mg daily by mouth. 11/04/16  Yes [provider]  Carboxymethylcellulose Sodium (REFRESH LIQUIGEL OP) Place 2 drops into both eyes in the morning, at noon, and at bedtime.   Yes [provider]  DULoxetine (CYMBALTA) 60 MG capsule Take 60 mg daily by mouth. 10/09/16  Yes [provider]  gabapentin (NEURONTIN) 100 MG capsule Take 100 mg by mouth daily at 12 noon.   Yes [provider]  gabapentin (NEURONTIN) 300 MG capsule Take 300 mg by mouth 3 (three) times daily.   Yes [provider]  HYDROcodone-acetaminophen (NORCO/VICODIN) 5-325 MG tablet Take 0.5 tablets by mouth 3 (three) times daily.   Yes [provider]  hydrOXYzine (ATARAX/VISTARIL) 25 MG tablet Take 25 mg by  mouth 3 (three) times daily as needed.   Yes [provider]  levothyroxine (SYNTHROID, LEVOTHROID) 75 MCG tablet Take 75 mcg daily by mouth. 08/28/16  Yes [provider]  losartan (COZAAR) 50 MG tablet Take 50 mg by mouth daily.   Yes [provider]  magnesium oxide (MAG-OX) 400 MG tablet Take 400 mg by mouth 2 (two) times daily.   Yes [provider]  melatonin 3 MG TABS tablet Take 3 mg by mouth at bedtime.   Yes [provider]  risperiDONE (RISPERDAL) 1 MG tablet Take 1-1.5 mg by mouth 2 (two) times daily. Take 1 mg every morning and 1.5 mg at bedtime   Yes [provider]  traZODone (DESYREL) 50 MG tablet Take 50 mg by mouth at bedtime.   Yes [provider]  Zinc Oxide (DESITIN) 13 % CREA Apply 1 application topically daily at 12 noon.   Yes [provider]  amoxicillin (AMOXIL) 500 MG capsule Take 2,000 mg See admin instructions by mouth. Take 2000 mg by mouth 1 hour prior to dental appointment Patient not taking: No sig reported    [provider]  atorvastatin (LIPITOR) 80 MG tablet Take 40 mg daily by mouth. Patient not taking: No sig reported 09/30/16   [provider]  donepezil (ARICEPT) 5 MG tablet Take 5 mg by mouth daily. Patient not taking: No sig reported 12/05/17   [provider]  hydrochlorothiazide (HYDRODIURIL) 12.5 MG tablet Take 12.5 mg daily by mouth. Patient not taking: No sig reported 10/23/16   [provider]  lisinopril (PRINIVIL,ZESTRIL) 30 MG tablet Take 30 mg daily by mouth. Patient not taking: No sig reported 10/23/16   [provider]  Magnesium 500 MG CAPS Take 1 capsule (500 mg total) by mouth daily. 03/06/18 09/02/18  Milinda Pointer, MD  metFORMIN (GLUCOPHAGE) 500 MG tablet Take 500 mg 2 (two) times daily by mouth. Patient not taking: No sig reported 10/10/16   [provider]  Multiple Vitamins-Minerals (MULTIVITAMIN PO) Take 1  tablet daily by mouth. Patient not taking: No sig reported    [provider]  oxymorphone (OPANA) 5 MG tablet Take 5 mg every 8 (eight) hours as needed by mouth for pain.  Patient not taking: No sig reported 10/25/16   [provider]  pioglitazone (ACTOS) 15 MG tablet Take 15 mg daily by mouth. Patient not taking: No sig reported 10/23/16   [provider]  vitamin B-12 (CYANOCOBALAMIN) 100 MCG tablet Take 100 mcg by mouth daily. Patient not taking: No sig reported    [provider]    Physical Exam: Vitals:   11/14/20 1745 11/14/20 1800 11/14/20  1815 11/14/20 1830  BP: (!) 157/114 (!) 150/108  (!) 159/103  Pulse: (!) 104 (!) 103 (!) 104 (!) 102  Resp: $Remo'19 16 20 18  'UYKAl$ Temp:      TempSrc:      SpO2: 97% 100% 95% 99%  Weight:       Physical Exam Vitals and nursing note reviewed.  Constitutional:      General: She is not in acute distress.    Appearance: She is not ill-appearing, toxic-appearing or diaphoretic.  HENT:     Head: Normocephalic.     Right Ear: External ear normal.     Left Ear: External ear normal.     Nose: Nose normal.  Eyes:     Pupils: Pupils are equal, round, and reactive to light.  Cardiovascular:     Rate and Rhythm: Normal rate and regular rhythm.     Pulses: Normal pulses.     Heart sounds: Normal heart sounds.  Pulmonary:     Effort: Pulmonary effort is normal.     Breath sounds: Normal breath sounds.  Abdominal:     General: Bowel sounds are normal. There is no distension.     Palpations: Abdomen is soft. There is no mass.     Tenderness: There is no abdominal tenderness. There is no guarding.     Hernia: No hernia is present.  Musculoskeletal:        General: No swelling or deformity.  Skin:    General: Skin is warm.  Neurological:     Mental Status: She is unresponsive.     GCS: GCS eye subscore is 1. GCS verbal subscore is 1. GCS motor subscore is 1.     Cranial Nerves: No facial asymmetry.    Labs on  Admission: I have personally reviewed following labs and imaging studies No results for input(s): CKTOTAL, CKMB, TROPONINI in the last 72 hours. Lab Results  Component Value Date   WBC 16.6 (H) 11/14/2020   HGB 14.4 11/14/2020   HCT 44.9 11/14/2020   MCV 93.9 11/14/2020   PLT 223 11/14/2020    Recent Labs  Lab 11/14/20 1811  NA 137  K 4.3  CL 100  CO2 28  BUN 27*  CREATININE 1.19*  CALCIUM 9.3  PROT 7.1  BILITOT 1.0  ALKPHOS 95  ALT 20  AST 26  GLUCOSE 200*   No results found for: CHOL, HDL, LDLCALC, TRIG No results found for: DDIMER Invalid input(s): POCBNP   COVID-19 Labs No results for input(s): DDIMER, FERRITIN, LDH, CRP in the last 72 hours. Lab Results  Component Value Date   Peak Not Detected 10/04/2018    Radiological Exams on Admission: CT HEAD WO CONTRAST (5MM)  Result Date: 11/14/2020 CLINICAL DATA:  Altered mental status, fall EXAM: CT HEAD WITHOUT CONTRAST TECHNIQUE: Contiguous axial images were obtained from the base of the skull through the vertex without intravenous contrast. COMPARISON:  11/13/2020 FINDINGS: Brain: There is atrophy and chronic small vessel disease changes. No acute intracranial abnormality. Specifically, no hemorrhage, hydrocephalus, mass lesion, acute infarction, or significant intracranial injury. Vascular: No hyperdense vessel or unexpected calcification. Skull: No acute calvarial abnormality. Sinuses/Orbits: No acute findings Other: None IMPRESSION: Atrophy, chronic microvascular disease. No acute intracranial abnormality. Electronically Signed   By: Rolm Baptise M.D.   On: 11/14/2020 17:54   CT HEAD WO CONTRAST  Result Date: 11/13/2020 CLINICAL DATA:  Fall from wheelchair. Head trauma, minor (Age >= 65y) EXAM: CT HEAD WITHOUT CONTRAST TECHNIQUE: Contiguous axial images  were obtained from the base of the skull through the vertex without intravenous contrast. COMPARISON:  Head CT 05/25/2020 FINDINGS: Brain: Stable degree of  atrophy and chronic small vessel ischemia. No intracranial hemorrhage, mass effect, or midline shift. No hydrocephalus. The basilar cisterns are patent. No evidence of territorial infarct or acute ischemia. No extra-axial or intracranial fluid collection. Vascular: Atherosclerosis of skullbase vasculature without hyperdense vessel or abnormal calcification. Skull: No fracture or focal lesion. Sinuses/Orbits: Mild mucosal thickening of ethmoid air cells. Bilateral cataract resection. Mastoid air cells are clear. Other: Left frontal scalp hematoma. IMPRESSION: 1. Left frontal scalp hematoma. No acute intracranial abnormality. No skull fracture. 2. Stable atrophy and chronic small vessel ischemia. Electronically Signed   By: Keith Rake M.D.   On: 11/13/2020 21:00    EKG: Independently reviewed.  Ventricular paced rhythm at  81 with qtc of 532.    Assessment/Plan:pt brought from snf for AMS and fall.  Principal Problem:   AMS (altered mental status) Active Problems:   Chronic kidney disease (CKD), stage III (moderate) (HCC)   GI bleed   Memory loss   Type 2 diabetes mellitus with stage 3 chronic kidney disease, without long-term current use of insulin (HCC)   Hypomagnesemia   Essential hypertension AMS:  Per daughter pt's baseline  is alert, and spontaneously speaking and cooperative , today she is  Unresponsive with GCS of 3. We will admit to medical telemetry and monitor heart rate. Initial head ct negative and MRI cannot be done due to pacemaker.  Suspect metabolic encephalopathy.  We will obtain thyroid panel, cbc, TFT, esr, ana. B12.consider neurology consult in am.    CKD: Stable and monitor, We will hold nephrotic agents  and renally dose all meds.  Lab Results  Component Value Date   CREATININE 1.19 (H) 11/14/2020   CREATININE 1.21 (H) 11/13/2020   CREATININE 0.98 05/25/2020  Ns at low rate overnight.    Diarrhea /GIB: Pt having red blood in stools and melena.  Stools  are loose and malodorous.  Iv ppi/ pepcid  Type / screen.   Memory loss: Currently meds held as pt is npo .  DM II:  Ssi/ glycemic protocol. Hold all po meds.  Hypomagnesemia: Replace and follow.   Hypertension: Blood pressure (!) 159/103, pulse (!) 102, temperature 98.2 F (36.8 C), temperature source Oral, resp. rate 18, weight 71.6 kg, SpO2 99 %. We will start with hydralazine.    Abnormal EKG: Initial ekg as above, received message from nurse about pt having rhythm change and doing a 12 lead ekg, which  showed q waves and st elevation and cardiology sent message to review compare , pt also has pacemaker and needs device interrogation.Per am team.   DVT prophylaxis:  SCD's   Code Status:  DNR   Family Communication:  Johnna Acosta (Daughter)  [phone] [facility] Phone)  Santiago Glad [phone] - daughter    Disposition Plan:  SNF.    Consults called:  GI-   Admission status: Inpatient.     Para Skeans MD [facility] [phone] How to contact the [facility] Attending or Consulting provider [place] or covering provider during after hours [place], for this patient.    Check the care team in [facility] and look for a) attending/consulting [facility] provider listed and b) the [facility] team listed Log into [email or web address] and use [place]'s universal password to access. If you do not have the password, please contact the hospital operator. Locate the [facility] provider you  are looking for under [facility] and page to a number that you can be directly reached. If you still have difficulty reaching the provider, please page the [facility] (Director on Call) for the Hospitalists listed on amion for assistance. [email or web address] Password [email or web address] 11/14/2020, 10:40 PM

## 2020-11-15 ENCOUNTER — Other Ambulatory Visit: Payer: Self-pay

## 2020-11-15 ENCOUNTER — Inpatient Hospital Stay: Payer: Medicare Other

## 2020-11-15 DIAGNOSIS — R4182 Altered mental status, unspecified: Secondary | ICD-10-CM | POA: Diagnosis not present

## 2020-11-15 LAB — GLUCOSE, CAPILLARY: Glucose-Capillary: 153 mg/dL — ABNORMAL HIGH (ref 70–99)

## 2020-11-15 LAB — GASTROINTESTINAL PANEL BY PCR, STOOL (REPLACES STOOL CULTURE)

## 2020-11-15 LAB — LACTIC ACID, PLASMA
Lactic Acid, Venous: 3.4 mmol/L (ref 0.5–1.9)
Lactic Acid, Venous: 2.1 mmol/L (ref 0.5–1.9)
Lactic Acid, Venous: 2.3 mmol/L (ref 0.5–1.9)

## 2020-11-15 LAB — CBC
HCT: 44.1 % (ref 36.0–46.0)
Hemoglobin: 14.6 g/dL (ref 12.0–15.0)
MCH: 30.7 pg (ref 26.0–34.0)
MCHC: 33.1 g/dL (ref 30.0–36.0)
MCV: 92.8 fL (ref 80.0–100.0)
Platelets: 250 K/uL (ref 150–400)
RBC: 4.75 MIL/uL (ref 3.87–5.11)
RDW: 13.4 % (ref 11.5–15.5)
WBC: 16.1 K/uL — ABNORMAL HIGH (ref 4.0–10.5)
nRBC: 0 % (ref 0.0–0.2)

## 2020-11-15 LAB — T4, FREE: Free T4: 0.81 ng/dL (ref 0.61–1.12)

## 2020-11-15 LAB — TYPE AND SCREEN
ABO/RH(D): O POS
Antibody Screen: NEGATIVE

## 2020-11-15 LAB — BASIC METABOLIC PANEL
Anion gap: 11 (ref 5–15)
BUN: 25 mg/dL — ABNORMAL HIGH (ref 8–23)
CO2: 26 mmol/L (ref 22–32)
Calcium: 8.9 mg/dL (ref 8.9–10.3)
Chloride: 102 mmol/L (ref 98–111)
Creatinine, Ser: 1.16 mg/dL — ABNORMAL HIGH (ref 0.44–1.00)
GFR, Estimated: 48 mL/min — ABNORMAL LOW (ref >60)
Glucose, Bld: 162 mg/dL — ABNORMAL HIGH (ref 70–99)
Potassium: 4.1 mmol/L (ref 3.5–5.1)
Sodium: 139 mmol/L (ref 135–145)

## 2020-11-15 LAB — C DIFFICILE QUICK SCREEN W PCR REFLEX
C Diff antigen: NEGATIVE
C Diff interpretation: NOT DETECTED
C Diff toxin: NEGATIVE

## 2020-11-15 LAB — TROPONIN I (HIGH SENSITIVITY)
Troponin I (High Sensitivity): 5 ng/L (ref <18)
Troponin I (High Sensitivity): 9 ng/L (ref <18)

## 2020-11-15 LAB — CBG MONITORING, ED
Glucose-Capillary: 176 mg/dL — ABNORMAL HIGH (ref 70–99)
Glucose-Capillary: 152 mg/dL — ABNORMAL HIGH (ref 70–99)
Glucose-Capillary: 174 mg/dL — ABNORMAL HIGH (ref 70–99)

## 2020-11-15 LAB — CK: Total CK: 54 U/L (ref 38–234)

## 2020-11-15 LAB — HEMOGLOBIN A1C
Hgb A1c MFr Bld: 6.8 % — ABNORMAL HIGH (ref 4.8–5.6)
Mean Plasma Glucose: 148 mg/dL

## 2020-11-15 LAB — TSH: TSH: 4.648 u[IU]/mL — ABNORMAL HIGH (ref 0.350–4.500)

## 2020-11-15 LAB — D-DIMER, QUANTITATIVE: D-Dimer, Quant: 17.57 ug/mL-FEU — ABNORMAL HIGH (ref 0.00–0.50)

## 2020-11-15 LAB — MRSA NEXT GEN BY PCR, NASAL: MRSA by PCR Next Gen: NOT DETECTED

## 2020-11-15 MED ORDER — CHLORHEXIDINE GLUCONATE CLOTH 2 % EX PADS
6.0000 | MEDICATED_PAD | Freq: Every day | CUTANEOUS | Status: DC
  Administered 2020-11-15 – 2020-11-18 (×4): 6 via TOPICAL

## 2020-11-15 MED ORDER — PIPERACILLIN-TAZOBACTAM 3.375 G IVPB
3.3750 g | Freq: Three times a day (TID) | INTRAVENOUS | Status: DC
  Administered 2020-11-15: 3.375 g via INTRAVENOUS
  Filled 2020-11-15: qty 50

## 2020-11-15 MED ORDER — PANTOPRAZOLE SODIUM 40 MG IV SOLR
40.0000 mg | Freq: Every day | INTRAVENOUS | Status: DC
  Administered 2020-11-15 – 2020-11-19 (×5): 40 mg via INTRAVENOUS
  Filled 2020-11-15 (×5): qty 40

## 2020-11-15 MED ORDER — IOHEXOL 350 MG/ML SOLN
75.0000 mL | Freq: Once | INTRAVENOUS | Status: AC | PRN
  Administered 2020-11-15: 75 mL via INTRAVENOUS

## 2020-11-15 MED ORDER — LEVOTHYROXINE SODIUM 100 MCG/5ML IV SOLN
50.0000 ug | Freq: Every day | INTRAVENOUS | Status: DC
  Administered 2020-11-15 – 2020-11-19 (×5): 50 ug via INTRAVENOUS
  Filled 2020-11-15 (×5): qty 5

## 2020-11-15 MED ORDER — SODIUM CHLORIDE 0.9 % IV SOLN
3.0000 g | Freq: Four times a day (QID) | INTRAVENOUS | Status: DC
  Administered 2020-11-15 – 2020-11-19 (×17): 3 g via INTRAVENOUS
  Filled 2020-11-15: qty 8
  Filled 2020-11-15 (×3): qty 3
  Filled 2020-11-15 (×3): qty 8
  Filled 2020-11-15: qty 3
  Filled 2020-11-15 (×3): qty 8
  Filled 2020-11-15: qty 3
  Filled 2020-11-15: qty 8
  Filled 2020-11-15: qty 3
  Filled 2020-11-15: qty 8
  Filled 2020-11-15 (×2): qty 3
  Filled 2020-11-15 (×4): qty 8
  Filled 2020-11-15 (×2): qty 3

## 2020-11-15 MED ORDER — ORAL CARE MOUTH RINSE
15.0000 mL | Freq: Two times a day (BID) | OROMUCOSAL | Status: DC
  Administered 2020-11-15 – 2020-11-19 (×7): 15 mL via OROMUCOSAL
  Filled 2020-11-15 (×2): qty 15

## 2020-11-15 MED ORDER — CHLORHEXIDINE GLUCONATE 0.12 % MT SOLN
15.0000 mL | Freq: Two times a day (BID) | OROMUCOSAL | Status: DC
  Administered 2020-11-15 – 2020-11-19 (×9): 15 mL via OROMUCOSAL
  Filled 2020-11-15 (×8): qty 15

## 2020-11-15 MED ORDER — METOPROLOL TARTRATE 25 MG PO TABS
12.5000 mg | ORAL_TABLET | Freq: Two times a day (BID) | ORAL | Status: DC
  Administered 2020-11-15 – 2020-11-19 (×8): 12.5 mg via ORAL
  Filled 2020-11-15 (×8): qty 1

## 2020-11-15 MED ORDER — NALOXONE HCL 2 MG/2ML IJ SOSY
0.4000 mg | PREFILLED_SYRINGE | Freq: Once | INTRAMUSCULAR | Status: AC
  Administered 2020-11-15: 0.4 mg via INTRAVENOUS
  Filled 2020-11-15: qty 2

## 2020-11-15 MED ORDER — INSULIN ASPART 100 UNIT/ML IJ SOLN
0.0000 [IU] | INTRAMUSCULAR | Status: DC
  Administered 2020-11-15 (×4): 2 [IU] via SUBCUTANEOUS
  Administered 2020-11-16: 1 [IU] via SUBCUTANEOUS
  Administered 2020-11-16: 2 [IU] via SUBCUTANEOUS
  Administered 2020-11-16: 1 [IU] via SUBCUTANEOUS
  Administered 2020-11-17: 3 [IU] via SUBCUTANEOUS
  Administered 2020-11-17: 2 [IU] via SUBCUTANEOUS
  Administered 2020-11-17: 1 [IU] via SUBCUTANEOUS
  Administered 2020-11-18 (×2): 2 [IU] via SUBCUTANEOUS
  Administered 2020-11-18 (×2): 1 [IU] via SUBCUTANEOUS
  Administered 2020-11-18: 09:00:00 2 [IU] via SUBCUTANEOUS
  Administered 2020-11-19: 1 [IU] via SUBCUTANEOUS
  Administered 2020-11-19 (×2): 2 [IU] via SUBCUTANEOUS
  Filled 2020-11-15 (×15): qty 1

## 2020-11-15 NOTE — Progress Notes (Addendum)
Cross Cover Follow up requested regarding EKG and lack of pacer spikes. Troponin negative 6/7 Background Admitted for altered mental status but did have fall 24 hours prior and evaluated in ER - left frontal scalp hematoma p small vessel ischemia atrophy, no hemorrhage . 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 compatible medtronic generator and wires pacemaker non rheumatic mitral valve disease  While in ED had significant amount hematochezia, (very water stool) however hgb stable   Objective EKG's non concerning Labs added ddimer - 17.57 Covid test - still pending GI panel MRSA PCR screen CK - not elevated 54 CTA chest - RUL pneumonia Exam - in ED rooom 7 upon return from CT - engaging in some appropriate verbalization now PERRL No commands followed  Encephalopathy - mental status may be baseline for her given chronic narcotic use and dementia - ordered narcan to eval for improvement in wakefulness.  Hold narcotics and neurontin Unable to do MRI here NPO - CBG  q4h  Sepsis  Elevated lactic, leukocytosis, AMS,  and tachycardia F/u blood and urine cultures Treat pneumonia Trend lactic   Pneumonia - change zosyn to unasyn NPO Speech swallow eval when more awake  Watery stools with hematochezia  Could be infectious - stool panel ordered HGB very stable to be acute bleed   Hypothyroidism - lbs still show with slightly elevated TSH and low T - should consider increase of mintenance dose For now IV dse 50 for 75 mcg daily dose

## 2020-11-15 NOTE — ED Notes (Signed)
Patient transported to CT 

## 2020-11-15 NOTE — ED Notes (Signed)
R AC PIV infiltrated, swelling noted at site, IV removed, catheter intact, dressing applied.

## 2020-11-15 NOTE — Progress Notes (Addendum)
PROGRESS NOTE    Chelsea Ruiz  WNI:[id number] DOB: 12-Oct-1940 DOA: 11/14/2020 PCP: Orvis Brill, [facility]    Brief Narrative:  This 80 years old female with PMH significant for dementia, hypertension, diabetes, arthritis, pacemaker follows up with Dr. Saralyn Pilar, CKD stage IIIb presented in ED with complaints of altered mental status , being found lethargic and hypotensive.  Patient was seen yesterday in the ED s/p fall and was discharged home.  Today patient was found lethargic and sleepy and nonverbal at home.  Family reported patient having diarrhea and she also noted to have red blood in the stools.  History was obtained from daughter Santiago Glad.  Patient was barely responsive with GCS of 3 on arrival.  Patient met sepsis criteria with leukocytosis, tachycardia, tachypnea, elevated lactic acid although no clear source.  Patient at baseline is wheelchair-bound.  CTA chest ruled out PE but shows consolidation consistent with pneumonia.  Cardiology, GI consulted for abnormal EKG and occult positive stools.  Patient is started on IV antibiotics.  Mental status has improved.   Assessment & Plan:   Principal Problem:   AMS (altered mental status) Active Problems:   Chronic kidney disease (CKD), stage III (moderate) (HCC)   Essential hypertension   Memory loss   Type 2 diabetes mellitus with stage 3 chronic kidney disease, without long-term current use of insulin (HCC)   Hypomagnesemia   GI bleed  Acute metabolic encephalopathy could be multifactorial: Patient was brought from home lethargic, not responding. At baseline patient is alert and spontaneously speaking and cooperative. Initial CT head negative.  MRI cannot be done due to pacemaker. UA unremarkable, patient does have pre-existing dementia. Discussed with neurology recommended repeat CT head in 1 to 2 days. CTA chest ruled out PE but shows right upper lobe consolidation. Continue IV antibiotics, followed blood and urine  cultures. Neurochecks every 4 hours.  Speech and swallow eval. Patient has improved now following commands.   Mental status seems at baseline now.  CKD stage IIIb: Renal functions at baseline. Avoid nephrotoxic medications.  Diarrhea with GI bleed: Patient having blood in the stools, malodorous stools Continue IV PPI and Pepcid. H&H remained stable. CTA; no evidence of GI bleed.  Finding consistent with inflammatory sigmoidocolitis. Follow-up GI panel, GI consulted awaiting recommendation.  Dementia: Will resume home medications.  Essential hypertension: Will resume home p.o. medications.   Continue hydralazine as needed now  Type 2 diabetes: Hold p.o. diabetic medications. Regular insulin sliding scale  Hypomagnesemia: Replaced. Continue to monitor  Abnormal EKG / S/P PACEMAKER: Cardiology consulted.  States changes are similar to prior EKG. Obtain 2D echocardiogram to further assess LV function and EF. Continue telemetry and consider metoprolol if tachycardia persists.   DVT prophylaxis:  SCDs Code Status: DNR Family Communication: Daughter at bed side. Disposition Plan:  Status is: Inpatient  Remains inpatient appropriate because: Altered mental status.  Sepsis secondary to pneumonia, inflammatory colitis with stool positive occult blood needs GI work-up.  Patient is not medically cleared to be discharged yet.  Anticipated discharge home in few days.  Consultants:  Cardiology GI Neurology  Procedures: CTA chest.  CT head Antimicrobials:   Anti-infectives (From admission, onward)    Start     Dose/Rate Route Frequency Ordered Stop   11/15/20 0600  Ampicillin-Sulbactam (UNASYN) 3 g in sodium chloride 0.9 % 100 mL IVPB        3 g 200 mL/hr over 30 Minutes Intravenous Every 6 hours 11/15/20 0533     11/15/20 0230  piperacillin-tazobactam (ZOSYN) IVPB 3.375 g  Status:  Discontinued        3.375 g 12.5 mL/hr over 240 Minutes Intravenous Every 8 hours  11/15/20 0226 11/15/20 0521        Subjective: Patient was seen and examined at bedside.  Overnight events noted.  Patient reports feeling better.  She is alert and oriented x 2. She knows her name and date of birth.  The daughter is bedside,  she stated patient is improving.  Objective: Vitals:   11/15/20 0630 11/15/20 0645 11/15/20 0915 11/15/20 1015  BP: 120/81 103/65 115/77 126/68  Pulse: (!) 103 (!) 103 (!) 108 95  Resp: $Remo'16 16 14 15  'yFPDp$ Temp:      TempSrc:      SpO2: 95% 95% 99% 99%  Weight:        Intake/Output Summary (Last 24 hours) at 11/15/2020 1143 Last data filed at 11/15/2020 0044 Gross per 24 hour  Intake --  Output 50 ml  Net -50 ml   Filed Weights   11/14/20 1705  Weight: 71.6 kg    Examination:  General exam: Appears comfortable, not in any acute distress. Respiratory system: Clear to auscultation. Respiratory effort normal. Cardiovascular system: S1-S2 heard, regular rate and rhythm, pacemaker noted.  No murmur.   Gastrointestinal system: Abdominal soft, nontender, nondistended, BS +. Central nervous system: Alert and oriented X 2. No focal neurological deficits. Extremities: No edema, no cyanosis, no clubbing. Skin: No rashes, lesions or ulcers Psychiatry: Judgement and insight appear normal. Mood & affect appropriate.     Data Reviewed: I have personally reviewed following labs and imaging studies  CBC: Recent Labs  Lab 11/13/20 2011 11/14/20 1811 11/15/20 0611  WBC 8.0 16.6* 16.1*  NEUTROABS  --  14.9*  --   HGB 11.9* 14.4 14.6  HCT 36.8 44.9 44.1  MCV 95.1 93.9 92.8  PLT 251 223 416   Basic Metabolic Panel: Recent Labs  Lab 11/13/20 2011 11/14/20 1811 11/15/20 0611  NA 141 137 139  K 3.8 4.3 4.1  CL 105 100 102  CO2 $Re'30 28 26  'hBI$ GLUCOSE 181* 200* 162*  BUN 29* 27* 25*  CREATININE 1.21* 1.19* 1.16*  CALCIUM 9.0 9.3 8.9   GFR: Estimated Creatinine Clearance: 37 mL/min (A) (by C-G formula based on SCr of 1.16 mg/dL (H)). Liver  Function Tests: Recent Labs  Lab 11/14/20 1811  AST 26  ALT 20  ALKPHOS 95  BILITOT 1.0  PROT 7.1  ALBUMIN 3.6   No results for input(s): LIPASE, AMYLASE in the last 168 hours. Recent Labs  Lab 11/14/20 2255  AMMONIA 14   Coagulation Profile: No results for input(s): INR, PROTIME in the last 168 hours. Cardiac Enzymes: Recent Labs  Lab 11/15/20 0330  CKTOTAL 54   BNP (last 3 results) No results for input(s): PROBNP in the last 8760 hours. HbA1C: No results for input(s): HGBA1C in the last 72 hours. CBG: Recent Labs  Lab 11/13/20 2021 11/15/20 0335 11/15/20 0730  GLUCAP 178* 176* 152*   Lipid Profile: No results for input(s): CHOL, HDL, LDLCALC, TRIG, CHOLHDL, LDLDIRECT in the last 72 hours. Thyroid Function Tests: Recent Labs    11/14/20 2255  TSH 4.648*  FREET4 0.81   Anemia Panel: No results for input(s): VITAMINB12, FOLATE, FERRITIN, TIBC, IRON, RETICCTPCT in the last 72 hours. Sepsis Labs: Recent Labs  Lab 11/14/20 2255 11/15/20 0036 11/15/20 6063  LATICACIDVEN 2.7* 3.4* 2.1*    Recent Results (from the past 240 hour(s))  CULTURE, BLOOD (ROUTINE X 2) w Reflex to ID Panel     Status: None (Preliminary result)   Collection Time: 11/14/20 10:55 PM   Specimen: BLOOD  Result Value Ref Range Status   Specimen Description BLOOD RA  Final   Special Requests   Final    BOTTLES DRAWN AEROBIC AND ANAEROBIC Blood Culture adequate volume   Culture   Final    NO GROWTH < 12 HOURS Performed at [facility], [address] [place], [place] [address]    Report Status PENDING  Incomplete  MRSA Next Gen by PCR, Nasal     Status: None   Collection Time: 11/15/20  6:36 AM   Specimen: Nasal Mucosa; Nasal Swab  Result Value Ref Range Status   MRSA by PCR Next Gen NOT DETECTED NOT DETECTED Final    Comment: (NOTE) The GeneXpert MRSA Assay (FDA approved for NASAL specimens only), is one component of a comprehensive MRSA colonization  surveillance program. It is not intended to diagnose MRSA infection nor to guide or monitor treatment for MRSA infections. Test performance is not FDA approved in patients less than 32 years old. Performed at [facility], [address] [place], [place] [address]     Radiology Studies: CT HEAD WO CONTRAST (5MM)  Result Date: 11/14/2020 CLINICAL DATA:  Altered mental status, fall EXAM: CT HEAD WITHOUT CONTRAST TECHNIQUE: Contiguous axial images were obtained from the base of the skull through the vertex without intravenous contrast. COMPARISON:  11/13/2020 FINDINGS: Brain: There is atrophy and chronic small vessel disease changes. No acute intracranial abnormality. Specifically, no hemorrhage, hydrocephalus, mass lesion, acute infarction, or significant intracranial injury. Vascular: No hyperdense vessel or unexpected calcification. Skull: No acute calvarial abnormality. Sinuses/Orbits: No acute findings Other: None IMPRESSION: Atrophy, chronic microvascular disease. No acute intracranial abnormality. Electronically Signed   By: Rolm Baptise M.D.   On: 11/14/2020 17:54   CT HEAD WO CONTRAST  Result Date: 11/13/2020 CLINICAL DATA:  Fall from wheelchair. Head trauma, minor (Age >= 65y) EXAM: CT HEAD WITHOUT CONTRAST TECHNIQUE: Contiguous axial images were obtained from the base of the skull through the vertex without intravenous contrast. COMPARISON:  Head CT 05/25/2020 FINDINGS: Brain: Stable degree of atrophy and chronic small vessel ischemia. No intracranial hemorrhage, mass effect, or midline shift. No hydrocephalus. The basilar cisterns are patent. No evidence of territorial infarct or acute ischemia. No extra-axial or intracranial fluid collection. Vascular: Atherosclerosis of skullbase vasculature without hyperdense vessel or abnormal calcification. Skull: No fracture or focal lesion. Sinuses/Orbits: Mild mucosal thickening of ethmoid air cells. Bilateral cataract resection. Mastoid  air cells are clear. Other: Left frontal scalp hematoma. IMPRESSION: 1. Left frontal scalp hematoma. No acute intracranial abnormality. No skull fracture. 2. Stable atrophy and chronic small vessel ischemia. Electronically Signed   By: Keith Rake M.D.   On: 11/13/2020 21:00   CT Angio Chest Pulmonary Embolism (PE) W or WO Contrast  Result Date: 11/15/2020 CLINICAL DATA:  Pulmonary embolism suspected EXAM: CT ANGIOGRAPHY CHEST WITH CONTRAST TECHNIQUE: Multidetector CT imaging of the chest was performed using the standard protocol during bolus administration of intravenous contrast. Multiplanar CT image reconstructions and MIPs were obtained to evaluate the vascular anatomy. CONTRAST:  42mL OMNIPAQUE IOHEXOL 350 MG/ML SOLN COMPARISON:  None. FINDINGS: Cardiovascular: Satisfactory opacification of the pulmonary arteries to the segmental level. No evidence of pulmonary embolism. Normal heart size. No pericardial effusion. Aortic atherosclerosis. Dual-chamber pacer leads into the right heart. Mediastinum/Nodes: Negative for adenopathy or mass. Lungs/Pleura:  Patchy ground-glass opacity in the right upper lobe. Mild atelectasis at the lung bases. Upper Abdomen: Marked atrophy of the left kidney with very poor enhancement. Atherosclerosis especially notable at the origin of the celiac. 7 mm splenic artery aneurysm. Musculoskeletal: Advanced and diffuse degenerative disease with spurring and endplate irregularity. 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 No acute osseous finding. Review of the MIP images confirms the above findings. IMPRESSION: 1. Mild right upper lobe pneumonia. 2. Negative for pulmonary embolism. 3.  Aortic Atherosclerosis (ICD10-I70.0). 4. Advanced left renal atrophy. Electronically Signed   By: Jorje Guild M.D.   On: 11/15/2020 05:00   CT ANGIO GI BLEED  Result Date: 11/15/2020 CLINICAL DATA:  GI bleed, altered mental status EXAM: CTA ABDOMEN AND PELVIS WITHOUT AND WITH CONTRAST TECHNIQUE:  Multidetector CT imaging of the abdomen and pelvis was performed using the standard protocol during bolus administration of intravenous contrast. Multiplanar reconstructed images and MIPs were obtained and reviewed to evaluate the vascular anatomy. CONTRAST:  160mL OMNIPAQUE IOHEXOL 350 MG/ML SOLN COMPARISON:  CT pelvis dated 09/27/2019 FINDINGS: VASCULAR Aorta: No evidence abdominal aortic aneurysm or dissection. Patent. Atherosclerotic calcifications. Celiac: Patent. SMA: Patent. Renals: Patent on the right.  Patent but stenotic on the left. IMA: Patent. Inflow: Patent bilaterally.  Atherosclerotic calcifications. Proximal Outflow: Patent bilaterally. Atherosclerotic calcifications. Veins: Grossly unremarkable. No findings of active GI bleeding on CT. See below for pertinent GI findings. Review of the MIP images confirms the above findings. NON-VASCULAR Lower chest: Lung bases are clear. Hepatobiliary: Liver is within normal limits. Status post cholecystectomy. No intrahepatic ductal dilatation. Common duct measures 8 mm and smoothly tapers at the ampulla. Pancreas: Within normal limits. Spleen: Within normal limits. Adrenals/Urinary Tract: Adrenal glands are within normal limits. Severe left renal atrophy. 3.1 x 3.4 cm septated posterior right lower pole renal cyst (series 7/image 44). 1.9 cm anterior right upper pole renal cyst (series 7/image 34). No hydronephrosis. Bladder is within normal limits. Stomach/Bowel: Stomach is notable for a tiny hiatal hernia. No evidence of bowel obstruction. Appendix is not discretely visualized. Sigmoid diverticulosis. Long segment rectosigmoid wall thickening, with associated pericolonic inflammatory changes, suggesting infectious/inflammatory proctocolitis. Masslike thickening involving the low rectum with associated mucosal enhancement (series 7/image 83). This is favored to be related to the patient's colitis, but an underlying rectal neoplasm is difficult to entirely  exclude. However, this region left normal on prior pelvic CT. As noted above, there is no evidence of active GI bleeding on CT, including in this location. Lymphatic: No suspicious abdominopelvic lymphadenopathy. Reproductive: Uterus is within normal limits. Bilateral ovaries are within normal limits. Other: No abdominopelvic ascites. Mild presacral fluid/stranding (series 7/image 70). Musculoskeletal: Status post L2-S1 PLIF. Degenerative changes of the visualized thoracolumbar spine. IMPRESSION: Long segment wall thickening/inflammatory changes involving the rectosigmoid colon, suggesting infectious/inflammatory proctocolitis. Focal wall thickening with mucosal enhancement involving the low rectum, favored to be related to the inflammatory process, although underlying rectal neoplasm is difficult to entirely exclude. Follow-up flexible sigmoidoscopy is suggested. No evidence of active GI bleeding on CT, including in this location. Electronically Signed   By: Julian Hy M.D.   On: 11/15/2020 00:26    Scheduled Meds:  insulin aspart  0-9 Units Subcutaneous Q4H   levothyroxine  50 mcg Intravenous Daily   sodium chloride flush  3 mL Intravenous Q12H   Continuous Infusions:  sodium chloride 50 mL/hr at 11/15/20 0055   ampicillin-sulbactam (UNASYN) IV Stopped (11/15/20 0701)     LOS: 1 day    Time spent: 35  mins    Shawna Clamp, MD [facility]   If 7PM-7AM, please contact night-coverage

## 2020-11-15 NOTE — ED Notes (Signed)
R sided ECG exported.

## 2020-11-15 NOTE — Consult Note (Addendum)
CARDIOLOGY CONSULT NOTE               Patient ID: Chelsea Ruiz MRN: [id number] DOB/AGE: Feb 07, 1940 80 y.o.  Admit date: 11/14/2020 Referring Physician: Para Skeans, MD  Primary Physician: Cataract Calls  Primary Cardiologist: Isaias Cowman, MD Reason for Consultation: abnormal ECG   HPI: 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 The patient's ECG was abnormal and concerning thus cardiology was consulted for further evaluation.   The patient continues to be lethargic and is disoriented x4, she was unable to answer any questions and slept throughout the examination. The patient's ECG reveals atrial-sensed ventricular paced rhythm with a RBBB which appears to be consistent with her previous ECG's from 09/2019.   ED course: the patient was recently seen in the ER on 11/13/2020 due to fall with sustained hematoma to the left frontal scalp. The patient CT of the head revealed no acute intracranial abnormality or skull fracture at that time. The patient was discharged back her SNF. Last night the patient was brought in via EMS for AMS that is beyond her baseline of dementia.  The patient was noted to have ECG changes from her previous admission thus an ACS workup was performed. The patient's high-sensitivity troponin was negative x4 at 7>>8>>5>>9. Other labs were as follows: BUN elevated at 27, creatinine elevated at 1.19, TSH noted to be elevated at 4.648. CT of the head again revealed no acute intracranial abnormalities.  CT of the chest revealed mild right upper lobe pneumonia, negative for pulmonary embolism, aortic atherosclerosis and advanced left renal atrophy. Her BP was initially elevated at  143/105, she was tachycardic at 106 bpm and tachypneic at 21 breaths per minutes. She has a 97% O2 sat on room air. The patient was noted to have GUAC-positive stools that is concerning for possible GI bleed. Her H/H is stable at 14.6/44.1. The patient was given protonix IV, antiemetics as needed and analgesics as needed. She is awaiting a bed for admission.   Review of systems complete and found to be negative unless listed above     Past Medical History:  Diagnosis Date   Depression    Diabetes mellitus without complication ([place])    Dyspnea    Dysrhythmia    Elevated lipids    Fatty liver    Hypertension    Hypothyroidism    Sleep apnea    Tremors of nervous system     Past Surgical History:  Procedure Laterality Date   BACK SURGERY     PACEMAKER INSERTION N/A 11/16/2016   Procedure: INSERTION PACEMAKER;  Surgeon: Isaias Cowman, MD;  Location: [facility] ORS;  Service: Cardiovascular;  Laterality: N/A;   TEAR DUCT PROBING     total knee Bilateral     (Not in a hospital admission)  Social History   Socioeconomic History   Marital status: Widowed    Spouse name: Not on file   Number of children: Not on file   Years of education: Not on file   Highest education level: Not on file  Occupational History   Not on file  Tobacco Use   Smoking status: Never   Smokeless tobacco: Never  Vaping Use   Vaping Use: Never used  Substance  and Sexual Activity   Alcohol use: No   Drug use: No   Sexual activity: Not on file  Other Topics Concern   Not on file  Social History Narrative   Not on file   Social Determinants of Health   Financial Resource Strain: Not on file  Food Insecurity: Not on file  Transportation Needs: Not on file  Physical Activity: Not on file  Stress: Not on file  Social Connections: Not on file  Intimate Partner Violence: Not on file    Family History  Problem Relation Age of Onset   Colon cancer Mother    Heart attack Father       Review  of systems complete and found to be negative unless listed above      PHYSICAL EXAM  General: lethargic, disoriented x4, well nourished, in no acute distress HEENT:  Normocephalic and left frontal hematoma Neck:  No JVD.  Lungs: Clear bilaterally to auscultation. Chest expansion symmetrical.  Heart: HRRR . Normal S1 and S2 with grade 2/6 murmur. No gallops.  Abdomen: Bowel sounds are positive, abdomen soft and non-tender  Msk:  decreased strength and tone for age. Extremities: LLE edema. No clubbing or cyanosis. Neuro: lethargic and disoriented X 3. Psych:  responds inappropriately  Labs:   Lab Results  Component Value Date   WBC 16.1 (H) 11/15/2020   HGB 14.6 11/15/2020   HCT 44.1 11/15/2020   MCV 92.8 11/15/2020   PLT 250 11/15/2020    Recent Labs  Lab 11/14/20 1811 11/15/20 0611  NA 137 139  K 4.3 4.1  CL 100 102  CO2 28 26  BUN 27* 25*  CREATININE 1.19* 1.16*  CALCIUM 9.3 8.9  PROT 7.1  --   BILITOT 1.0  --   ALKPHOS 95  --   ALT 20  --   AST 26  --   GLUCOSE 200* 162*   Lab Results  Component Value Date   CKTOTAL 54 11/15/2020   TROPONINI <0.03 04/25/2018   No results found for: CHOL No results found for: HDL No results found for: LDLCALC No results found for: TRIG No results found for: CHOLHDL No results found for: LDLDIRECT    Radiology: CT HEAD WO CONTRAST ( )  Result Date: 11/14/2020 CLINICAL DATA:  Altered mental status, fall EXAM: CT HEAD WITHOUT CONTRAST TECHNIQUE: Contiguous axial images were obtained from the base of the skull through the vertex without intravenous contrast. COMPARISON:  11/13/2020 FINDINGS: Brain: There is atrophy and chronic small vessel disease changes. No acute intracranial abnormality. Specifically, no hemorrhage, hydrocephalus, mass lesion, acute infarction, or significant intracranial injury. Vascular: No hyperdense vessel or unexpected calcification. Skull: No acute calvarial abnormality. Sinuses/Orbits: No acute  findings Other: None IMPRESSION: Atrophy, chronic microvascular disease. No acute intracranial abnormality. Electronically Signed   By: Charlett Nose M.D.   On: 11/14/2020 17:54   CT HEAD WO CONTRAST  Result Date: 11/13/2020 CLINICAL DATA:  Fall from wheelchair. Head trauma, minor (Age >= 65y) EXAM: CT HEAD WITHOUT CONTRAST TECHNIQUE: Contiguous axial images were obtained from the base of the skull through the vertex without intravenous contrast. COMPARISON:  Head CT 05/25/2020 FINDINGS: Brain: Stable degree of atrophy and chronic small vessel ischemia. No intracranial hemorrhage, mass effect, or midline shift. No hydrocephalus. The basilar cisterns are patent. No evidence of territorial infarct or acute ischemia. No extra-axial or intracranial fluid collection. Vascular: Atherosclerosis of skullbase vasculature without hyperdense vessel or abnormal calcification. Skull: No fracture or focal lesion. Sinuses/Orbits: Mild  mucosal thickening of ethmoid air cells. Bilateral cataract resection. Mastoid air cells are clear. Other: Left frontal scalp hematoma. IMPRESSION: 1. Left frontal scalp hematoma. No acute intracranial abnormality. No skull fracture. 2. Stable atrophy and chronic small vessel ischemia. Electronically Signed   By: Keith Rake M.D.   On: 11/13/2020 21:00   CT Angio Chest Pulmonary Embolism (PE) W or WO Contrast  Result Date: 11/15/2020 CLINICAL DATA:  Pulmonary embolism suspected EXAM: CT ANGIOGRAPHY CHEST WITH CONTRAST TECHNIQUE: Multidetector CT imaging of the chest was performed using the standard protocol during bolus administration of intravenous contrast. Multiplanar CT image reconstructions and MIPs were obtained to evaluate the vascular anatomy. CONTRAST:  2mL OMNIPAQUE IOHEXOL 350 MG/ML SOLN COMPARISON:  None. FINDINGS: Cardiovascular: Satisfactory opacification of the pulmonary arteries to the segmental level. No evidence of pulmonary embolism. Normal heart size. No pericardial  effusion. Aortic atherosclerosis. Dual-chamber pacer leads into the right heart. Mediastinum/Nodes: Negative for adenopathy or mass. Lungs/Pleura: Patchy ground-glass opacity in the right upper lobe. Mild atelectasis at the lung bases. Upper Abdomen: Marked atrophy of the left kidney with very poor enhancement. Atherosclerosis especially notable at the origin of the celiac. 7 mm splenic artery aneurysm. Musculoskeletal: Advanced and diffuse degenerative disease with spurring and endplate irregularity. 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 No acute osseous finding. Review of the MIP images confirms the above findings. IMPRESSION: 1. Mild right upper lobe pneumonia. 2. Negative for pulmonary embolism. 3.  Aortic Atherosclerosis (ICD10-I70.0). 4. Advanced left renal atrophy. Electronically Signed   By: Jorje Guild M.D.   On: 11/15/2020 05:00   CT ANGIO GI BLEED  Result Date: 11/15/2020 CLINICAL DATA:  GI bleed, altered mental status EXAM: CTA ABDOMEN AND PELVIS WITHOUT AND WITH CONTRAST TECHNIQUE: Multidetector CT imaging of the abdomen and pelvis was performed using the standard protocol during bolus administration of intravenous contrast. Multiplanar reconstructed images and MIPs were obtained and reviewed to evaluate the vascular anatomy. CONTRAST:  142mL OMNIPAQUE IOHEXOL 350 MG/ML SOLN COMPARISON:  CT pelvis dated 09/27/2019 FINDINGS: VASCULAR Aorta: No evidence abdominal aortic aneurysm or dissection. Patent. Atherosclerotic calcifications. Celiac: Patent. SMA: Patent. Renals: Patent on the right.  Patent but stenotic on the left. IMA: Patent. Inflow: Patent bilaterally.  Atherosclerotic calcifications. Proximal Outflow: Patent bilaterally. Atherosclerotic calcifications. Veins: Grossly unremarkable. No findings of active GI bleeding on CT. See below for pertinent GI findings. Review of the MIP images confirms the above findings. NON-VASCULAR Lower chest: Lung bases are clear. Hepatobiliary: Liver is within  normal limits. Status post cholecystectomy. No intrahepatic ductal dilatation. Common duct measures 8 mm and smoothly tapers at the ampulla. Pancreas: Within normal limits. Spleen: Within normal limits. Adrenals/Urinary Tract: Adrenal glands are within normal limits. Severe left renal atrophy. 3.1 x 3.4 cm septated posterior right lower pole renal cyst (series 7/image 44). 1.9 cm anterior right upper pole renal cyst (series 7/image 34). No hydronephrosis. Bladder is within normal limits. Stomach/Bowel: Stomach is notable for a tiny hiatal hernia. No evidence of bowel obstruction. Appendix is not discretely visualized. Sigmoid diverticulosis. Long segment rectosigmoid wall thickening, with associated pericolonic inflammatory changes, suggesting infectious/inflammatory proctocolitis. Masslike thickening involving the low rectum with associated mucosal enhancement (series 7/image 83). This is favored to be related to the patient's colitis, but an underlying rectal neoplasm is difficult to entirely exclude. However, this region left normal on prior pelvic CT. As noted above, there is no evidence of active GI bleeding on CT, including in this location. Lymphatic: No suspicious abdominopelvic lymphadenopathy. Reproductive: Uterus is within normal  limits. Bilateral ovaries are within normal limits. Other: No abdominopelvic ascites. Mild presacral fluid/stranding (series 7/image 70). Musculoskeletal: Status post L2-S1 PLIF. Degenerative changes of the visualized thoracolumbar spine. IMPRESSION: Long segment wall thickening/inflammatory changes involving the rectosigmoid colon, suggesting infectious/inflammatory proctocolitis. Focal wall thickening with mucosal enhancement involving the low rectum, favored to be related to the inflammatory process, although underlying rectal neoplasm is difficult to entirely exclude. Follow-up flexible sigmoidoscopy is suggested. No evidence of active GI bleeding on CT, including in this  location. Electronically Signed   By: Julian Hy M.D.   On: 11/15/2020 00:26    EKG: atrial-sensed, ventricular paced rhythm with RBBB and HR of 104 bpm.   ASSESSMENT AND PLAN:  Mrs. 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 The patient's ECG was remarkable for atrial sensed, ventricular paced rhythm with a right bundle branch block and a heart rate of 104 bpm. When compared to ECG's from 2021 and 2020 the patient has had a RBBB; however, her most recent ECG from 11/13/2020 showed a slight LBBB with low voltage which is usual. The patient's The patient's high-sensitivity troponin was negative x4 at 7>>8>>5>>9 and ACS appears to be less likely at this time time. The patient is normotensive at this time and continues to be tachycardic with HR in the low 100's bpm which could be due to her underlying acute processes with the suspected GI bleed or possible infection with her elevated lactic acid at 3.4.    # Atrial-sensed Ventricular Paced rhythm with RBBB # Tachycardia # Mobitz type I and type II s/p PPM (11/2016) ECG remarkable for atrial sensed ventricular paced rhythm with a right bundle branch block and a heart rate of 104 bpm which is consistent with previous ECG's from 2021 and 2020. The patient appears to be asymptomatic and the high sensitive troponins are negative x4. Tachycardia is likley in response to underlying GI bleed versus infectious process.   Recommendations:  - Echocardiogram for further evaluation of LV function and EF.   -Continuous cardiac monitoring until discharge.  - Consider metoprolol tartrate if tachycardia persists.    # HTN Patient is normotensive at this time.   - continue hydralazine IV PRN  systolic bp [id number].  - consider beta blocker as above to help with BP management.   - continue to Hold losartan at this time in the presence of acute on chronic kidney disease.   # HLD # DM Type II  -Continue atorvastatin therapy.  -Agree with current scale insulin per protocol.   # GUAC positive likely secondary to GI bleed   -Agree with current management.  -Consider GI referral.  # acute on chronic CKD   - Recommend holding ARB as above.  - trend BMP.     # OSA  -Recommend nightly use of CPAP machine is indicated per RT.   #  AMS with baseline Dementia # Sepsis  # Right upper lobe Pneumonia  - Agree with current management.   - Agree with antibiotic therapy.     Thanks for consulting [facility].  Signed: Cassadi Purdie [id number]-AG 11/15/2020, 8:09 AM

## 2020-11-15 NOTE — Consult Note (Signed)
Consultation  Referring Provider:     Dr Lucianne Muss Admit date 11/14/20 Consult date    11/15/20     Reason for Consultation:     rectal bleeding/diarrhea         HPI:   Chelsea Ruiz is a 80 y.o. 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  She was admitted yesterday for AMS/lethargy- noted at that time to have dark diarrhea with some red material in it- noted also to meet criteria for sepsis-possible uti and c diff test is pending collection. Stoll pcr negative. Cbc with leukocytosis, serial hemoglobins of 14.4  and 14.6 respectively. Bun and creatinine mildly elevated- 25-29 and 1.21-1.16, gfr 46-48.Liver enzymes normal.  CTA a/p yesterday with patent vessels, however there was some concern for long segment rectosigmoid colitis favored to be infectious or inflammatory process however was unable to rule out neoplasm- these changes were not noted on a prior exam 9/21.  There was no evidence of active GIB. Note her mother had colon cancer.  There is no family at present and patient is confused as to time,date and place- so history is gleaned from chart. Tried calling her daughter Tresa Endo but line busy  PREVIOUS ENDOSCOPIES:             No endoscopy reports available  Past Medical History:  Diagnosis Date   Depression    Diabetes mellitus without complication (HCC)    Dyspnea    Dysrhythmia    Elevated lipids    Fatty liver    Hypertension    Hypothyroidism    Sleep apnea    Tremors of nervous system     Past Surgical History:  Procedure Laterality Date   BACK SURGERY     PACEMAKER INSERTION N/A 11/16/2016    Procedure: INSERTION PACEMAKER;  Surgeon: Marcina Millard, MD;  Location: [facility] ORS;  Service: Cardiovascular;  Laterality: N/A;   TEAR DUCT PROBING     total knee Bilateral     Family History  Problem Relation Age of Onset   Colon cancer Mother    Heart attack Father      Social History   Tobacco Use   Smoking status: Never   Smokeless tobacco: Never  Vaping Use   Vaping Use: Never used  Substance Use Topics   Alcohol use: No   Drug use: No    Prior to Admission medications   Medication Sig Start Date End Date Taking? Authorizing Provider  acetaminophen (TYLENOL) 500 MG tablet Take 1,000 mg by mouth every 6 (six) hours as needed.   Yes [provider]  aspirin EC 81 MG tablet Take 81 mg daily by mouth.   Yes [provider]  buPROPion (WELLBUTRIN XL) 150 MG 24 hr tablet Take 150 mg daily by mouth. 11/04/16  Yes [provider]  Carboxymethylcellulose Sodium (REFRESH LIQUIGEL OP) Place 2 drops into both eyes in the morning, at noon, and at bedtime.   Yes [provider]  DULoxetine (CYMBALTA) 60 MG capsule Take 60 mg daily by mouth. 10/09/16  Yes [provider]  gabapentin (NEURONTIN) 100 MG capsule Take 100 mg by mouth daily at 12 noon.   Yes  [provider]  gabapentin (NEURONTIN) 300 MG capsule Take 300 mg by mouth 3 (three) times daily.   Yes [provider]  HYDROcodone-acetaminophen (NORCO/VICODIN) 5-325 MG tablet Take 0.5 tablets by mouth 3 (three) times daily.   Yes [provider]  hydrOXYzine (ATARAX/VISTARIL) 25 MG tablet Take 25 mg by mouth 3 (three) times daily as needed.   Yes [provider]  levothyroxine (SYNTHROID, LEVOTHROID) 75 MCG tablet Take 75 mcg daily by mouth. 08/28/16  Yes [provider]  losartan (COZAAR) 50 MG tablet Take 50 mg by mouth daily.   Yes [provider]  magnesium oxide (MAG-OX) 400 MG tablet Take 400 mg by mouth 2 (two) times daily.   Yes  [provider]  melatonin 3 MG TABS tablet Take 3 mg by mouth at bedtime.   Yes [provider]  risperiDONE (RISPERDAL) 1 MG tablet Take 1-1.5 mg by mouth 2 (two) times daily. Take 1 mg every morning and 1.5 mg at bedtime   Yes [provider]  traZODone (DESYREL) 50 MG tablet Take 50 mg by mouth at bedtime.   Yes [provider]  Zinc Oxide (DESITIN) 13 % CREA Apply 1 application topically daily at 12 noon.   Yes [provider]  amoxicillin (AMOXIL) 500 MG capsule Take 2,000 mg See admin instructions by mouth. Take 2000 mg by mouth 1 hour prior to dental appointment Patient not taking: No sig reported    [provider]  atorvastatin (LIPITOR) 80 MG tablet Take 40 mg daily by mouth. Patient not taking: No sig reported 09/30/16   [provider]  donepezil (ARICEPT) 5 MG tablet Take 5 mg by mouth daily. Patient not taking: No sig reported 12/05/17   [provider]  hydrochlorothiazide (HYDRODIURIL) 12.5 MG tablet Take 12.5 mg daily by mouth. Patient not taking: No sig reported 10/23/16   [provider]  lisinopril (PRINIVIL,ZESTRIL) 30 MG tablet Take 30 mg daily by mouth. Patient not taking: No sig reported 10/23/16   [provider]  Magnesium 500 MG CAPS Take 1 capsule (500 mg total) by mouth daily. 03/06/18 09/02/18  Milinda Pointer, MD  metFORMIN (GLUCOPHAGE) 500 MG tablet Take 500 mg 2 (two) times daily by mouth. Patient not taking: No sig reported 10/10/16   [provider]  Multiple Vitamins-Minerals (MULTIVITAMIN PO) Take 1 tablet daily by mouth. Patient not taking: No sig reported    [provider]  oxymorphone (OPANA) 5 MG tablet Take 5 mg every 8 (eight) hours as needed by mouth for pain.  Patient not taking: No sig reported 10/25/16   [provider]  pioglitazone (ACTOS) 15 MG tablet Take 15 mg daily by mouth. Patient not taking: No sig reported 10/23/16    [provider]  vitamin B-12 (CYANOCOBALAMIN) 100 MCG tablet Take 100 mcg by mouth daily. Patient not taking: No sig reported    [provider]    Current Facility-Administered Medications  Medication Dose Route Frequency Provider Last Rate Last Admin   0.9 %  sodium chloride infusion   Intravenous Continuous Para Skeans, MD 50 mL/hr at 11/15/20 0055 New Bag at 11/15/20 0055   acetaminophen (TYLENOL) tablet 650 mg  650 mg Oral Q6H PRN Para Skeans, MD       Or   acetaminophen (TYLENOL) suppository 650 mg  650 mg Rectal Q6H PRN Para Skeans, MD       Ampicillin-Sulbactam (UNASYN) 3 g in sodium chloride 0.9 %  100 mL IVPB  3 g Intravenous Q6H Gertha Calkin, MD   Stopped at 11/15/20 0701   chlorhexidine (PERIDEX) 0.12 % solution 15 mL  15 mL Mouth Rinse BID Cipriano Bunker, MD       hydrALAZINE (APRESOLINE) injection 5 mg  5 mg Intravenous Q4H PRN Gertha Calkin, MD       insulin aspart (novoLOG) injection 0-9 Units  0-9 Units Subcutaneous Q4H Manuela Schwartz, NP   2 Units at 11/15/20 7408   levothyroxine (SYNTHROID, LEVOTHROID) injection 50 mcg  50 mcg Intravenous Daily Manuela Schwartz, NP   50 mcg at 11/15/20 1448   MEDLINE mouth rinse  15 mL Mouth Rinse q12n4p Cipriano Bunker, MD       ondansetron [facility]) tablet 4 mg  4 mg Oral Q6H PRN Gertha Calkin, MD       Or   ondansetron [facility]) injection 4 mg  4 mg Intravenous Q6H PRN Gertha Calkin, MD       sodium chloride flush (NS) 0.9 % injection 3 mL  3 mL Intravenous Q12H Gertha Calkin, MD   3 mL at 11/15/20 0045   Current Outpatient Medications  Medication Sig Dispense Refill   acetaminophen (TYLENOL) 500 MG tablet Take 1,000 mg by mouth every 6 (six) hours as needed.     aspirin EC 81 MG tablet Take 81 mg daily by mouth.     buPROPion (WELLBUTRIN XL) 150 MG 24 hr tablet Take 150 mg daily by mouth.  3   Carboxymethylcellulose Sodium (REFRESH LIQUIGEL OP) Place 2 drops into both eyes in the morning, at noon, and at  bedtime.     DULoxetine (CYMBALTA) 60 MG capsule Take 60 mg daily by mouth.  3   gabapentin (NEURONTIN) 100 MG capsule Take 100 mg by mouth daily at 12 noon.     gabapentin (NEURONTIN) 300 MG capsule Take 300 mg by mouth 3 (three) times daily.     HYDROcodone-acetaminophen (NORCO/VICODIN) 5-325 MG tablet Take 0.5 tablets by mouth 3 (three) times daily.     hydrOXYzine (ATARAX/VISTARIL) 25 MG tablet Take 25 mg by mouth 3 (three) times daily as needed.     levothyroxine (SYNTHROID, LEVOTHROID) 75 MCG tablet Take 75 mcg daily by mouth.  3   losartan (COZAAR) 50 MG tablet Take 50 mg by mouth daily.     magnesium oxide (MAG-OX) 400 MG tablet Take 400 mg by mouth 2 (two) times daily.     melatonin 3 MG TABS tablet Take 3 mg by mouth at bedtime.     risperiDONE (RISPERDAL) 1 MG tablet Take 1-1.5 mg by mouth 2 (two) times daily. Take 1 mg every morning and 1.5 mg at bedtime     traZODone (DESYREL) 50 MG tablet Take 50 mg by mouth at bedtime.     Zinc Oxide (DESITIN) 13 % CREA Apply 1 application topically daily at 12 noon.     amoxicillin (AMOXIL) 500 MG capsule Take 2,000 mg See admin instructions by mouth. Take 2000 mg by mouth 1 hour prior to dental appointment (Patient not taking: No sig reported)     atorvastatin (LIPITOR) 80 MG tablet Take 40 mg daily by mouth. (Patient not taking: No sig reported)  1   donepezil (ARICEPT) 5 MG tablet Take 5 mg by mouth daily. (Patient not taking: No sig reported)     hydrochlorothiazide (HYDRODIURIL) 12.5 MG tablet Take 12.5 mg daily by mouth. (Patient not taking: No sig reported)  6  lisinopril (PRINIVIL,ZESTRIL) 30 MG tablet Take 30 mg daily by mouth. (Patient not taking: No sig reported)  6   Magnesium 500 MG CAPS Take 1 capsule (500 mg total) by mouth daily. 30 capsule 5   metFORMIN (GLUCOPHAGE) 500 MG tablet Take 500 mg 2 (two) times daily by mouth. (Patient not taking: No sig reported)  0   Multiple Vitamins-Minerals (MULTIVITAMIN PO) Take 1 tablet daily by  mouth. (Patient not taking: No sig reported)     oxymorphone (OPANA) 5 MG tablet Take 5 mg every 8 (eight) hours as needed by mouth for pain.  (Patient not taking: No sig reported)  0   pioglitazone (ACTOS) 15 MG tablet Take 15 mg daily by mouth. (Patient not taking: No sig reported)  6   vitamin B-12 (CYANOCOBALAMIN) 100 MCG tablet Take 100 mcg by mouth daily. (Patient not taking: No sig reported)      Allergies as of 11/14/2020 - Review Complete 11/14/2020  Allergen Reaction Noted   Methadone Other (See Comments) 11/10/2016   Morphine and related Other (See Comments) 11/09/2016   Oxycodone Other (See Comments) 11/10/2016   Lamotrigine Palpitations 08/27/2018     Review of Systems:    All systems reviewed and negative except where noted in HPI.      Physical Exam:  Vital signs in last 24 hours: Temp:  [98.2 F (36.8 C)] 98.2 F (36.8 C) (11/13 1700) Pulse Rate:  [95-108] 95 (11/14 1015) Resp:  [14-22] 15 (11/14 1015) BP: (103-161)/(65-114) 126/68 (11/14 1015) SpO2:  [92 %-100 %] 99 % (11/14 1015) Weight:  [71.6 kg] 71.6 kg (11/13 1705)   General:   Pleasant elderly woman in NAD- roused to light touch/speech Head:  Normocephalic and atraumatic. Eyes:   No icterus.   Conjunctiva pink. Ears:  Normal auditory acuity. Mouth: Mucosa pink moist, no lesions. Neck:  Supple; no masses felt Lungs:  Respirations even and unlabored. Lungs clear to auscultation bilaterally.   No wheezes, crackles, or rhonchi.  Heart:  S1S2, RRR, no MRG. No edema. Abdomen:   Flat, soft, nondistended, nontender. Normal bowel sounds. No appreciable masses or hepatomegaly. No rebound signs or other peritoneal signs.Large amount of medium brown loose stool in diaper. She has a foley catheter Rectal: not performed. F Msk:  MAE x4, however there is weakness No clubbing or cyanosis. Strength 4/5. Symmetrical without gross deformities.there is some right forearm edema and a bandaid Neurologic:  Alert and   oriented x 1   Cranial nerves appear II-XII intact.  Skin:  Warm, dry, pink without there is an oblongish bruise to the left forehead/temple Psych:  Calm, drowsy, tangential speech.   LAB RESULTS: Recent Labs    11/13/20 2011 11/14/20 1811 11/15/20 0611  WBC 8.0 16.6* 16.1*  HGB 11.9* 14.4 14.6  HCT 36.8 44.9 44.1  PLT 251 223 250   BMET Recent Labs    11/13/20 2011 11/14/20 1811 11/15/20 0611  NA 141 137 139  K 3.8 4.3 4.1  CL 105 100 102  CO2 30 28 26   GLUCOSE 181* 200* 162*  BUN 29* 27* 25*  CREATININE 1.21* 1.19* 1.16*  CALCIUM 9.0 9.3 8.9   LFT Recent Labs    11/14/20 1811  PROT 7.1  ALBUMIN 3.6  AST 26  ALT 20  ALKPHOS 95  BILITOT 1.0   PT/INR No results for input(s): LABPROT, INR in the last 72 hours.  STUDIES: CT HEAD WO CONTRAST (5MM)  Result Date: 11/14/2020 CLINICAL DATA:  Altered mental  status, fall EXAM: CT HEAD WITHOUT CONTRAST TECHNIQUE: Contiguous axial images were obtained from the base of the skull through the vertex without intravenous contrast. COMPARISON:  11/13/2020 FINDINGS: Brain: There is atrophy and chronic small vessel disease changes. No acute intracranial abnormality. Specifically, no hemorrhage, hydrocephalus, mass lesion, acute infarction, or significant intracranial injury. Vascular: No hyperdense vessel or unexpected calcification. Skull: No acute calvarial abnormality. Sinuses/Orbits: No acute findings Other: None IMPRESSION: Atrophy, chronic microvascular disease. No acute intracranial abnormality. Electronically Signed   By: Charlett NoseKevin  Dover M.D.   On: 11/14/2020 17:54   CT HEAD WO CONTRAST  Result Date: 11/13/2020 CLINICAL DATA:  Fall from wheelchair. Head trauma, minor (Age >= 65y) EXAM: CT HEAD WITHOUT CONTRAST TECHNIQUE: Contiguous axial images were obtained from the base of the skull through the vertex without intravenous contrast. COMPARISON:  Head CT 05/25/2020 FINDINGS: Brain: Stable degree of atrophy and chronic small  vessel ischemia. No intracranial hemorrhage, mass effect, or midline shift. No hydrocephalus. The basilar cisterns are patent. No evidence of territorial infarct or acute ischemia. No extra-axial or intracranial fluid collection. Vascular: Atherosclerosis of skullbase vasculature without hyperdense vessel or abnormal calcification. Skull: No fracture or focal lesion. Sinuses/Orbits: Mild mucosal thickening of ethmoid air cells. Bilateral cataract resection. Mastoid air cells are clear. Other: Left frontal scalp hematoma. IMPRESSION: 1. Left frontal scalp hematoma. No acute intracranial abnormality. No skull fracture. 2. Stable atrophy and chronic small vessel ischemia. Electronically Signed   By: Narda RutherfordMelanie  Sanford M.D.   On: 11/13/2020 21:00   CT Angio Chest Pulmonary Embolism (PE) W or WO Contrast  Result Date: 11/15/2020 CLINICAL DATA:  Pulmonary embolism suspected EXAM: CT ANGIOGRAPHY CHEST WITH CONTRAST TECHNIQUE: Multidetector CT imaging of the chest was performed using the standard protocol during bolus administration of intravenous contrast. Multiplanar CT image reconstructions and MIPs were obtained to evaluate the vascular anatomy. CONTRAST:  75mL OMNIPAQUE IOHEXOL 350 MG/ML SOLN COMPARISON:  None. FINDINGS: Cardiovascular: Satisfactory opacification of the pulmonary arteries to the segmental level. No evidence of pulmonary embolism. Normal heart size. No pericardial effusion. Aortic atherosclerosis. Dual-chamber pacer leads into the right heart. Mediastinum/Nodes: Negative for adenopathy or mass. Lungs/Pleura: Patchy ground-glass opacity in the right upper lobe. Mild atelectasis at the lung bases. Upper Abdomen: Marked atrophy of the left kidney with very poor enhancement. Atherosclerosis especially notable at the origin of the celiac. 7 mm splenic artery aneurysm. Musculoskeletal: Advanced and diffuse degenerative disease with spurring and endplate irregularity. 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 No acute  osseous finding. Review of the MIP images confirms the above findings. IMPRESSION: 1. Mild right upper lobe pneumonia. 2. Negative for pulmonary embolism. 3.  Aortic Atherosclerosis (ICD10-I70.0). 4. Advanced left renal atrophy. Electronically Signed   By: Tiburcio PeaJonathan  Watts M.D.   On: 11/15/2020 05:00   CT ANGIO GI BLEED  Result Date: 11/15/2020 CLINICAL DATA:  GI bleed, altered mental status EXAM: CTA ABDOMEN AND PELVIS WITHOUT AND WITH CONTRAST TECHNIQUE: Multidetector CT imaging of the abdomen and pelvis was performed using the standard protocol during bolus administration of intravenous contrast. Multiplanar reconstructed images and MIPs were obtained and reviewed to evaluate the vascular anatomy. CONTRAST:  100mL OMNIPAQUE IOHEXOL 350 MG/ML SOLN COMPARISON:  CT pelvis dated 09/27/2019 FINDINGS: VASCULAR Aorta: No evidence abdominal aortic aneurysm or dissection. Patent. Atherosclerotic calcifications. Celiac: Patent. SMA: Patent. Renals: Patent on the right.  Patent but stenotic on the left. IMA: Patent. Inflow: Patent bilaterally.  Atherosclerotic calcifications. Proximal Outflow: Patent bilaterally. Atherosclerotic calcifications. Veins: Grossly unremarkable. No findings of active  GI bleeding on CT. See below for pertinent GI findings. Review of the MIP images confirms the above findings. NON-VASCULAR Lower chest: Lung bases are clear. Hepatobiliary: Liver is within normal limits. Status post cholecystectomy. No intrahepatic ductal dilatation. Common duct measures 8 mm and smoothly tapers at the ampulla. Pancreas: Within normal limits. Spleen: Within normal limits. Adrenals/Urinary Tract: Adrenal glands are within normal limits. Severe left renal atrophy. 3.1 x 3.4 cm septated posterior right lower pole renal cyst (series 7/image 44). 1.9 cm anterior right upper pole renal cyst (series 7/image 34). No hydronephrosis. Bladder is within normal limits. Stomach/Bowel: Stomach is notable for a tiny hiatal  hernia. No evidence of bowel obstruction. Appendix is not discretely visualized. Sigmoid diverticulosis. Long segment rectosigmoid wall thickening, with associated pericolonic inflammatory changes, suggesting infectious/inflammatory proctocolitis. Masslike thickening involving the low rectum with associated mucosal enhancement (series 7/image 83). This is favored to be related to the patient's colitis, but an underlying rectal neoplasm is difficult to entirely exclude. However, this region left normal on prior pelvic CT. As noted above, there is no evidence of active GI bleeding on CT, including in this location. Lymphatic: No suspicious abdominopelvic lymphadenopathy. Reproductive: Uterus is within normal limits. Bilateral ovaries are within normal limits. Other: No abdominopelvic ascites. Mild presacral fluid/stranding (series 7/image 70). Musculoskeletal: Status post L2-S1 PLIF. Degenerative changes of the visualized thoracolumbar spine. IMPRESSION: Long segment wall thickening/inflammatory changes involving the rectosigmoid colon, suggesting infectious/inflammatory proctocolitis. Focal wall thickening with mucosal enhancement involving the low rectum, favored to be related to the inflammatory process, although underlying rectal neoplasm is difficult to entirely exclude. Follow-up flexible sigmoidoscopy is suggested. No evidence of active GI bleeding on CT, including in this location. Electronically Signed   By: Julian Hy M.D.   On: 11/15/2020 00:26       Impression / Plan:   Diarrhea/ reported rectal bleeding- abnormal CT rectosigmoid colon. Hgb normal.  Agree with c-diff and if present recommend treating accordingly. If negative consider flexible sigmoidoscopy as clinically feasible. Will try to find out if she has ever had colonoscopy- but no reports available in this chart or care everywhere  Thank you very much for this consult. These services were provided by Stephens November, NP-C, in  collaboration with Lesly Rubenstein, MD, with whom I have discussed this patient in full.   Stephens November, NP-C

## 2020-11-15 NOTE — Progress Notes (Signed)
Pharmacy Antibiotic Note  Channel Papandrea is a 80 y.o. female admitted on 11/14/2020 with  aspiration PNA .  Pharmacy has been consulted for Unasyn dosing.  Plan: Unasyn 3 gm IV Q6H ordered to start on 11/14 @ 0600.   Weight: 71.6 kg (157 lb 13.6 oz)  Temp (24hrs), Avg:98.2 F (36.8 C), Min:98.2 F (36.8 C), Max:98.2 F (36.8 C)  Recent Labs  Lab 11/13/20 2011 11/14/20 1811 11/14/20 2255 11/15/20 0036  WBC 8.0 16.6*  --   --   CREATININE 1.21* 1.19*  --   --   LATICACIDVEN  --   --  2.7* 3.4*    Estimated Creatinine Clearance: 36.1 mL/min (A) (by C-G formula based on SCr of 1.19 mg/dL (H)).    Allergies  Allergen Reactions   Methadone Other (See Comments)    Hallucinations and memory   Morphine And Related Other (See Comments)    Abnormal thinking and memory loss   Oxycodone Other (See Comments)    Hallucinations and memory   Lamotrigine Palpitations    Antimicrobials this admission:   >>    >>   Dose adjustments this admission:   Microbiology results:  BCx:   UCx:    Sputum:    MRSA PCR:   Thank you for allowing pharmacy to be a part of this patient's care.  Shiri Hodapp D 11/15/2020 5:36 AM

## 2020-11-15 NOTE — Evaluation (Addendum)
Clinical/Bedside Swallow Evaluation Patient Details  Name: Chelsea Ruiz MRN: [id number] Date of Birth: [birth year]  Today's Date: 11/15/2020 Time: SLP Start Time (ACUTE ONLY): 0908 SLP Stop Time (ACUTE ONLY): 0947 SLP Time Calculation (min) (ACUTE ONLY): 39 min  Past Medical History:  Past Medical History:  Diagnosis Date   Depression    Diabetes mellitus without complication (HCC)    Dyspnea    Dysrhythmia    Elevated lipids    Fatty liver    Hypertension    Hypothyroidism    Sleep apnea    Tremors of nervous system    Past Surgical History:  Past Surgical History:  Procedure Laterality Date   BACK SURGERY     PACEMAKER INSERTION N/A 11/16/2016   Procedure: INSERTION PACEMAKER;  Surgeon: Marcina Millard, MD;  Location: [facility] ORS;  Service: Cardiovascular;  Laterality: N/A;   TEAR DUCT PROBING     total knee Bilateral    HPI:  Per Physician's H&P "Chelsea Ruiz is a 80 y.o. female seen in ed with complaints of AMS, lethargic , hypertension. Pt was seen yesterday for fall eval and d/c. Today pt is lethargic and sleepy and nonverbal , diarrhea that started today, noted to have red blood in it.HPI is o/w unobtainable due to dementia. Pt is unresponsive with gcs of 3. Daughter bedside- ms karen. Pt also has colon cancer history in mom. Last scope was two years ago. "  CT Angio Chest 11/15/20 "1. Mild right upper lobe pneumonia. 2. Negative for pulmonary embolism 3.  Aortic Atherosclerosis (ICD10-I70.0). 4. Advanced left renal atrophy.''   Assessment / Plan / Recommendation  Clinical Impression  Pt seen for clinical swallowing evaluation. Pt alert. Voice initially hoase/hypophonic, but improved greatly with PO trials. Pt unable to follow commands for participation in oral motor examination; however, significant xerostomia noted with dried, cracked appearance to lingual body.   Daughter noted pt consumed a "softer" diet at SNF and required assistance and cueing for  feeding.   Pt given trials of ice chips, thin liquids, pureed, and softened solid. Pt with mildly prolonged mastication with softened solid. Otherwise, oral phase was functional with adequate oral clearance. No overt s/sx pharyngeal dysphagia. Timely swallow initation and seemingly adequate laryngeal elevation to palpation. Requires verbal/tactile cues to accept POs throughout evaluation. Pt is at increased risk for aspiration/aspiration PNA given dementia, multiple medical comorbidities, and dependence for feeding. Risks are reduced with diet modifications and safe swallowing strategies/aspiration precautions.   Recommend initiation of Dysphagia Level 2 diet with thin liquids and safe swallowing strategies/aspiration precautions as outlined below. Pt with need strict 1:1 supervision/assistance and verbal/tactile cues for feeding.   SLP to f/u per POC for diet tolerance given pt's increased risk and current dx of PNA.  Pt's daughter educated re: diet recommendations, safe swallowing strategies/aspiration precautions, and SLP POC. Daughter verbalized understanding/agreement. RN also made aware.   SLP Visit Diagnosis: Dysphagia, oral phase (R13.11)    Aspiration Risk  Mild aspiration risk;Moderate aspiration risk    Diet Recommendation Dysphagia 2 (Fine chop);Thin liquid   Medication Administration: Crushed with puree Supervision: Staff to assist with self feeding;Full supervision/cueing for compensatory strategies Compensations: Minimize environmental distractions;Slow rate;Small sips/bites;Follow solids with liquid Postural Changes: Seated upright at 90 degrees    Other  Recommendations Oral Care Recommendations: Oral care QID;Staff/trained caregiver to provide oral care    Recommendations for follow up therapy are one component of a multi-disciplinary discharge planning process, led by the attending physician.  Recommendations may  be updated based on patient status, additional functional  criteria and insurance authorization.  Follow up Recommendations  (TBD)      Assistance Recommended at Discharge Frequent or constant Supervision/Assistance  Frequency and Duration min 2x/week  1 week       Prognosis Prognosis for Safe Diet Advancement: Fair Barriers to Reach Goals: Cognitive deficits      Swallow Study   General Date of Onset: 11/14/20 HPI: Per Physician's H&P "Chelsea Ruiz is a 80 y.o. female seen in ed with complaints of AMS, lethargic , hypertension. Pt was seen yesterday for fall eval and d/c. Today pt is lethargic and sleepy and nonverbal , diarrhea that started today, noted to have red blood in it.HPI is o/w unobtainable due to dementia. Pt is unresponsive with gcs of 3. Daughter bedside- ms karen. Pt also has colon cancer history in mom. Last scope was two years ago. " Type of Study: Bedside Swallow Evaluation Previous Swallow Assessment: unknown Diet Prior to this Study:  (consumes "softer" foods at SNF per daughter) Respiratory Status: Room air History of Recent Intubation: No Behavior/Cognition: Alert;Confused;Requires cueing Oral Cavity Assessment: Dry Oral Care Completed by SLP:  (completed pt daughter) Oral Cavity - Dentition: Adequate natural dentition Vision:  (needs assistance with feeding PTA) Self-Feeding Abilities: Total assist Patient Positioning: Upright in bed Baseline Vocal Quality: Normal (initially hoarse, but improved with PO intake) Volitional Cough: Cognitively unable to elicit Volitional Swallow: Unable to elicit    Oral/Motor/Sensory Function Overall Oral Motor/Sensory Function: Generalized oral weakness   Ice Chips Ice chips: Within functional limits Presentation: Spoon Other Comments: x5 ice chips   Thin Liquid Thin Liquid: Within functional limits Presentation: Straw Other Comments: ~4 oz    Puree Puree: Within functional limits Presentation: Spoon Other Comments: ~3 oz   Solid     Solid: Impaired (cracker  softened in pudding) Presentation: Spoon Oral Phase Impairments: Impaired mastication Oral Phase Functional Implications:  (mildly prolonged mastication) Other Comments: x2 halves of graham cracker softened in pudding    Clyde Canterbury, M.S., CCC-SLP Speech-Language Pathologist [facility] [phone] (ASCOM)  Woodroe Chen 11/15/2020,12:01 PM

## 2020-11-15 NOTE — ED Notes (Signed)
Pt returned from CT. Morrisson, NP at bedside for eval.

## 2020-11-16 ENCOUNTER — Inpatient Hospital Stay: Payer: Medicare Other

## 2020-11-16 DIAGNOSIS — R4182 Altered mental status, unspecified: Secondary | ICD-10-CM | POA: Diagnosis not present

## 2020-11-16 LAB — CBC
HCT: 36.4 % (ref 36.0–46.0)
Hemoglobin: 12.2 g/dL (ref 12.0–15.0)
MCH: 31.3 pg (ref 26.0–34.0)
MCHC: 33.5 g/dL (ref 30.0–36.0)
MCV: 93.3 fL (ref 80.0–100.0)
Platelets: 219 10*3/uL (ref 150–400)
RBC: 3.9 MIL/uL (ref 3.87–5.11)
RDW: 13.9 % (ref 11.5–15.5)
WBC: 14.7 10*3/uL — ABNORMAL HIGH (ref 4.0–10.5)
nRBC: 0 % (ref 0.0–0.2)

## 2020-11-16 LAB — BASIC METABOLIC PANEL
Anion gap: 8 (ref 5–15)
BUN: 31 mg/dL — ABNORMAL HIGH (ref 8–23)
CO2: 27 mmol/L (ref 22–32)
Calcium: 8.6 mg/dL — ABNORMAL LOW (ref 8.9–10.3)
Chloride: 104 mmol/L (ref 98–111)
Creatinine, Ser: 1.47 mg/dL — ABNORMAL HIGH (ref 0.44–1.00)
GFR, Estimated: 36 mL/min — ABNORMAL LOW (ref >60)
Glucose, Bld: 131 mg/dL — ABNORMAL HIGH (ref 70–99)
Potassium: 3.6 mmol/L (ref 3.5–5.1)
Sodium: 139 mmol/L (ref 135–145)

## 2020-11-16 LAB — URINE CULTURE

## 2020-11-16 LAB — GLUCOSE, CAPILLARY
Glucose-Capillary: 103 mg/dL — ABNORMAL HIGH (ref 70–99)
Glucose-Capillary: 113 mg/dL — ABNORMAL HIGH (ref 70–99)
Glucose-Capillary: 126 mg/dL — ABNORMAL HIGH (ref 70–99)
Glucose-Capillary: 129 mg/dL — ABNORMAL HIGH (ref 70–99)
Glucose-Capillary: 134 mg/dL — ABNORMAL HIGH (ref 70–99)
Glucose-Capillary: 139 mg/dL — ABNORMAL HIGH (ref 70–99)
Glucose-Capillary: 145 mg/dL — ABNORMAL HIGH (ref 70–99)

## 2020-11-16 LAB — LACTIC ACID, PLASMA: Lactic Acid, Venous: 1.2 mmol/L (ref 0.5–1.9)

## 2020-11-16 LAB — PHOSPHORUS: Phosphorus: 3.8 mg/dL (ref 2.5–4.6)

## 2020-11-16 LAB — MAGNESIUM: Magnesium: 1.7 mg/dL (ref 1.7–2.4)

## 2020-11-16 MED ORDER — LORAZEPAM 2 MG/ML IJ SOLN
0.5000 mg | Freq: Once | INTRAMUSCULAR | Status: AC
  Administered 2020-11-16: 0.5 mg via INTRAVENOUS
  Filled 2020-11-16: qty 1

## 2020-11-16 NOTE — Progress Notes (Signed)
   11/15/20 1554  Assess: MEWS Score  Pulse Rate (!) 109  SpO2 95 %  O2 Device Room Air  Assess: MEWS Score  MEWS Temp 0  MEWS Systolic 0  MEWS Pulse 1  MEWS RR 0  MEWS LOC 1  MEWS Score 2  MEWS Score Color Yellow  Assess: if the MEWS score is Yellow or Red  Were vital signs taken at a resting state? Yes  Focused Assessment No change from prior assessment  Does the patient meet 2 or more of the SIRS criteria? No  MEWS guidelines implemented *See Row Information* Yes  Treat  MEWS Interventions Administered scheduled meds/treatments  Pain Scale 0-10  Pain Score 0  Take Vital Signs  Increase Vital Sign Frequency  Yellow: Q 2hr X 2 then Q 4hr X 2, if remains yellow, continue Q 4hrs  Escalate  MEWS: Escalate Yellow: discuss with charge nurse/RN and consider discussing with provider and RRT  Notify: Charge Nurse/RN  Name of Charge Nurse/RN Notified Alcario Drought  Date Charge Nurse/RN Notified 11/15/20  Time Charge Nurse/RN Notified 1600  Notify: Provider  Provider Name/Title Dr. Allena Katz  Assess: SIRS CRITERIA  SIRS Temperature  0  SIRS Pulse 1  SIRS Respirations  0  SIRS WBC 1  SIRS Score Sum  2

## 2020-11-16 NOTE — Progress Notes (Addendum)
PROGRESS NOTE    Chelsea Ruiz  TTS:[id number] DOB: Jun 20, 1940 DOA: 11/14/2020 PCP: Orvis Brill, [facility]    Brief Narrative:  This 80 years old female with PMH significant for dementia, hypertension, diabetes, arthritis, pacemaker follows up with Dr. Saralyn Pilar, CKD stage IIIb brought in ED with complaints of altered mental status , She was being found lethargic and hypotensive.  Patient was seen yesterday in the ED s/p fall and was discharged home.Next day Patient was found lethargic and sleepy and nonverbal at home.  Family reported patient having diarrhea and she also noted to have red blood in the stools.  History was obtained from daughter Santiago Glad.  Patient was barely responsive with GCS of 3 on arrival.  Patient met sepsis criteria with leukocytosis, tachycardia, tachypnea, elevated lactic acid although no clear source at arrival.  Patient at baseline is wheelchair-bound.  CTA chest ruled out PE but shows consolidation consistent with pneumonia.  Cardiology and GI consulted for abnormal EKG and occult positive stools.  Patient is started on IV antibiotics.  Mental status has improved.   Assessment & Plan:   Principal Problem:   AMS (altered mental status) Active Problems:   Chronic kidney disease (CKD), stage III (moderate) (HCC)   Essential hypertension   Memory loss   Type 2 diabetes mellitus with stage 3 chronic kidney disease, without long-term current use of insulin (HCC)   Hypomagnesemia   GI bleed  Acute Encephalopathy could be multifactorial: Patient was brought from home lethargic, not responding. At baseline patient is alert and spontaneously speaking and cooperative. Initial CT head negative.  MRI cannot be done due to pacemaker. UA unremarkable, patient does have pre-existing dementia. Discussed with neurology recommended repeat CT head in 1 to 2 days. CTA chest ruled out PE but shows right upper lobe consolidation. Continue IV antibiotics, follow up blood  and urine cultures. Neurochecks every 4 hours.  Speech and swallow eval. Patient has improved, seems back to her self,  now following commands.   Follow-up repeat CT head 48 hrs later.  Sepsis sec.to Aspiration PNA / Proctosigmoiditis: Patient met sepsis criteria with leukocytosis, tachycardia, tachypnea, elevated lactic acid, CTA infiltrate. Continue Unasyn for now. Blood cultures no growth so far.  Normalized with IV hydration. Sepsis physiology is improving. GI panel and C. difficile negative.  CKD stage IIIb: Renal functions at baseline. Avoid nephrotoxic medications.  Diarrhea with GI bleed: Patient having blood in the stools, malodorous stools. Continue IV PPI and Pepcid. H&H remained stable. CTA; No evidence of GI bleed.  Findings consistent with inflammatory sigmoidocolitis. GI panel, C. difficile negative.  Patient needs sigmoidoscopy as per GI. Family is requesting colonoscopy since 2 family member died because of colon cancer. Continue IV antibiotics.  Dementia: Resume home medications.  Essential hypertension: Continue hydralazine PRN. Continue metoprolol 12.5 mg twice daily BP controlled.  Type 2 diabetes: Hold p.o. diabetic medications. Regular insulin sliding scale  Hypomagnesemia: Replaced. Continue to monitor  Abnormal EKG / S/P PACEMAKER: Cardiology consulted.  States changes are similar to prior EKG. Obtain 2D echocardiogram to further assess LV function and EF. Continue telemetry and consider metoprolol if tachycardia persists.   DVT prophylaxis:  SCDs Code Status: DNR Family Communication: Daughter at bed side. Disposition Plan:  Status is: Inpatient  Remains inpatient appropriate because: Altered mental status.  Sepsis secondary to pneumonia, inflammatory colitis with stool positive occult blood needs GI work-up.  Patient is not medically cleared to be discharged yet.  Anticipated discharge home in few days.  Consultants:   Cardiology GI Neurology  Procedures: CTA chest.  CT head Antimicrobials:   Anti-infectives (From admission, onward)    Start     Dose/Rate Route Frequency Ordered Stop   11/15/20 0600  Ampicillin-Sulbactam (UNASYN) 3 g in sodium chloride 0.9 % 100 mL IVPB        3 g 200 mL/hr over 30 Minutes Intravenous Every 6 hours 11/15/20 0533     11/15/20 0230  piperacillin-tazobactam (ZOSYN) IVPB 3.375 g  Status:  Discontinued        3.375 g 12.5 mL/hr over 240 Minutes Intravenous Every 8 hours 11/15/20 0226 11/15/20 0521        Subjective: Patient was seen and examined at bedside.  Overnight events noted.  Patient reports feeling better.  Patient is alert, oriented x 2.  She knows the place, person,  not the time. Grand daughter is  at bedside,  she stated patient is improving.  Objective: Vitals:   11/15/20 2035 11/15/20 2256 11/16/20 0516 11/16/20 0735  BP: 113/75  130/61 96/69  Pulse: (!) 122 80 82 85  Resp: 18  18   Temp: 98.5 F (36.9 C)  97.8 F (36.6 C) 97.7 F (36.5 C)  TempSrc: Oral  Oral   SpO2: 97%  98% 97%  Weight:        Intake/Output Summary (Last 24 hours) at 11/16/2020 1050 Last data filed at 11/16/2020 0950 Gross per 24 hour  Intake 616.13 ml  Output 200 ml  Net 416.13 ml   Filed Weights   11/14/20 1705  Weight: 71.6 kg    Examination:  General exam: Appears comfortable, not in any acute distress.  Chronically ill looking. Respiratory system: Clear to auscultation bilaterally . Respiratory effort normal.  RR 15. Cardiovascular system: S1-S2 heard, regular rate and rhythm, pacemaker noted.  No murmur.   Gastrointestinal system: Abdomen  soft, nontender, nondistended, BS +. Central nervous system: Alert and oriented X 2. No focal neurological deficits. Extremities: No edema, no cyanosis, no clubbing. Skin: No rashes, lesions or ulcers Psychiatry: Judgement and insight appear normal. Mood & affect appropriate.     Data Reviewed: I have personally  reviewed following labs and imaging studies  CBC: Recent Labs  Lab 11/13/20 2011 11/14/20 1811 11/15/20 0611 11/16/20 0446  WBC 8.0 16.6* 16.1* 14.7*  NEUTROABS  --  14.9*  --   --   HGB 11.9* 14.4 14.6 12.2  HCT 36.8 44.9 44.1 36.4  MCV 95.1 93.9 92.8 93.3  PLT 251 223 250 937   Basic Metabolic Panel: Recent Labs  Lab 11/13/20 2011 11/14/20 1811 11/15/20 0611 11/16/20 0446  NA 141 137 139 139  K 3.8 4.3 4.1 3.6  CL 105 100 102 104  CO2 $Re'30 28 26 27  'ffP$ GLUCOSE 181* 200* 162* 131*  BUN 29* 27* 25* 31*  CREATININE 1.21* 1.19* 1.16* 1.47*  CALCIUM 9.0 9.3 8.9 8.6*  MG  --   --   --  1.7  PHOS  --   --   --  3.8   GFR: Estimated Creatinine Clearance: 29.2 mL/min (A) (by C-G formula based on SCr of 1.47 mg/dL (H)). Liver Function Tests: Recent Labs  Lab 11/14/20 1811  AST 26  ALT 20  ALKPHOS 95  BILITOT 1.0  PROT 7.1  ALBUMIN 3.6   No results for input(s): LIPASE, AMYLASE in the last 168 hours. Recent Labs  Lab 11/14/20 2255  AMMONIA 14   Coagulation Profile: No results for input(s): INR, PROTIME  in the last 168 hours. Cardiac Enzymes: Recent Labs  Lab 11/15/20 0330  CKTOTAL 54   BNP (last 3 results) No results for input(s): PROBNP in the last 8760 hours. HbA1C: Recent Labs    11/14/20 2255  HGBA1C 6.8*   CBG: Recent Labs  Lab 11/15/20 1225 11/15/20 2037 11/16/20 0011 11/16/20 0344 11/16/20 0733  GLUCAP 174* 153* 103* 129* 113*   Lipid Profile: No results for input(s): CHOL, HDL, LDLCALC, TRIG, CHOLHDL, LDLDIRECT in the last 72 hours. Thyroid Function Tests: Recent Labs    11/14/20 2255  TSH 4.648*  FREET4 0.81   Anemia Panel: No results for input(s): VITAMINB12, FOLATE, FERRITIN, TIBC, IRON, RETICCTPCT in the last 72 hours. Sepsis Labs: Recent Labs  Lab 11/15/20 0036 11/15/20 0611 11/15/20 1422 11/16/20 0818  LATICACIDVEN 3.4* 2.1* 2.3* 1.2    Recent Results (from the past 240 hour(s))  CULTURE, BLOOD (ROUTINE X 2) w Reflex  to ID Panel     Status: None (Preliminary result)   Collection Time: 11/14/20 10:55 PM   Specimen: BLOOD  Result Value Ref Range Status   Specimen Description BLOOD RA  Final   Special Requests   Final    BOTTLES DRAWN AEROBIC AND ANAEROBIC Blood Culture adequate volume   Culture   Final    NO GROWTH < 12 HOURS Performed at [facility], [address] [place], [place] [address]    Report Status PENDING  Incomplete  Urine Culture     Status: Abnormal   Collection Time: 11/15/20 12:36 AM   Specimen: In/Out Cath Urine  Result Value Ref Range Status   Specimen Description   Final    IN/OUT CATH URINE Performed at [facility], [address] [place], [place] [address]    Special Requests   Final    NONE Performed at [facility], [address], [place], [place] [address]    Culture MULTIPLE SPECIES PRESENT, SUGGEST RECOLLECTION (A)  Final   Report Status 11/16/2020 FINAL  Final  Gastrointestinal Panel by PCR , Stool     Status: None   Collection Time: 11/15/20  6:36 AM   Specimen: Nasal Mucosa; Stool  Result Value Ref Range Status   Campylobacter species NOT DETECTED NOT DETECTED Final   Plesimonas shigelloides NOT DETECTED NOT DETECTED Final   Salmonella species NOT DETECTED NOT DETECTED Final   Yersinia enterocolitica NOT DETECTED NOT DETECTED Final   Vibrio species NOT DETECTED NOT DETECTED Final   Vibrio cholerae NOT DETECTED NOT DETECTED Final   Enteroaggregative E coli (EAEC) NOT DETECTED NOT DETECTED Final   Enteropathogenic E coli (EPEC) NOT DETECTED NOT DETECTED Final   Enterotoxigenic E coli (ETEC) NOT DETECTED NOT DETECTED Final   Shiga like toxin producing E coli (STEC) NOT DETECTED NOT DETECTED Final   Shigella/Enteroinvasive E coli (EIEC) NOT DETECTED NOT DETECTED Final   Cryptosporidium NOT DETECTED NOT DETECTED Final   Cyclospora cayetanensis NOT DETECTED NOT DETECTED Final   Entamoeba histolytica NOT DETECTED NOT DETECTED Final    Giardia lamblia NOT DETECTED NOT DETECTED Final   Adenovirus F40/41 NOT DETECTED NOT DETECTED Final   Astrovirus NOT DETECTED NOT DETECTED Final   Norovirus GI/GII NOT DETECTED NOT DETECTED Final   Rotavirus A NOT DETECTED NOT DETECTED Final   Sapovirus (I, II, IV, and V) NOT DETECTED NOT DETECTED Final    Comment: Performed at [facility], [address], [place], [place] [address]  MRSA Next Gen by PCR, Nasal  Status: None   Collection Time: 11/15/20  6:36 AM   Specimen: Nasal Mucosa; Nasal Swab  Result Value Ref Range Status   MRSA by PCR Next Gen NOT DETECTED NOT DETECTED Final    Comment: (NOTE) The GeneXpert MRSA Assay (FDA approved for NASAL specimens only), is one component of a comprehensive MRSA colonization surveillance program. It is not intended to diagnose MRSA infection nor to guide or monitor treatment for MRSA infections. Test performance is not FDA approved in patients less than 40 years old. Performed at [facility], [place], [place] [address]   C Difficile Quick Screen w PCR reflex     Status: None   Collection Time: 11/15/20  2:00 PM   Specimen: STOOL  Result Value Ref Range Status   C Diff antigen NEGATIVE NEGATIVE Final   C Diff toxin NEGATIVE NEGATIVE Final   C Diff interpretation No C. difficile detected.  Final    Comment: Performed at [facility], [address] [place],  [address]    Radiology Studies: CT HEAD WO CONTRAST (5MM)  Result Date: 11/14/2020 CLINICAL DATA:  Altered mental status, fall EXAM: CT HEAD WITHOUT CONTRAST TECHNIQUE: Contiguous axial images were obtained from the base of the skull through the vertex without intravenous contrast. COMPARISON:  11/13/2020 FINDINGS: Brain: There is atrophy and chronic small vessel disease changes. No acute intracranial abnormality. Specifically, no hemorrhage, hydrocephalus, mass lesion, acute infarction, or significant intracranial injury.  Vascular: No hyperdense vessel or unexpected calcification. Skull: No acute calvarial abnormality. Sinuses/Orbits: No acute findings Other: None IMPRESSION: Atrophy, chronic microvascular disease. No acute intracranial abnormality. Electronically Signed   By: Rolm Baptise M.D.   On: 11/14/2020 17:54   CT Angio Chest Pulmonary Embolism (PE) W or WO Contrast  Result Date: 11/15/2020 CLINICAL DATA:  Pulmonary embolism suspected EXAM: CT ANGIOGRAPHY CHEST WITH CONTRAST TECHNIQUE: Multidetector CT imaging of the chest was performed using the standard protocol during bolus administration of intravenous contrast. Multiplanar CT image reconstructions and MIPs were obtained to evaluate the vascular anatomy. CONTRAST:  68mL OMNIPAQUE IOHEXOL 350 MG/ML SOLN COMPARISON:  None. FINDINGS: Cardiovascular: Satisfactory opacification of the pulmonary arteries to the segmental level. No evidence of pulmonary embolism. Normal heart size. No pericardial effusion. Aortic atherosclerosis. Dual-chamber pacer leads into the right heart. Mediastinum/Nodes: Negative for adenopathy or mass. Lungs/Pleura: Patchy ground-glass opacity in the right upper lobe. Mild atelectasis at the lung bases. Upper Abdomen: Marked atrophy of the left kidney with very poor enhancement. Atherosclerosis especially notable at the origin of the celiac. 7 mm splenic artery aneurysm. Musculoskeletal: Advanced and diffuse degenerative disease with spurring and endplate irregularity. 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 No acute osseous finding. Review of the MIP images confirms the above findings. IMPRESSION: 1. Mild right upper lobe pneumonia. 2. Negative for pulmonary embolism. 3.  Aortic Atherosclerosis (ICD10-I70.0). 4. Advanced left renal atrophy. Electronically Signed   By: Jorje Guild M.D.   On: 11/15/2020 05:00   CT ANGIO GI BLEED  Result Date: 11/15/2020 CLINICAL DATA:  GI bleed, altered mental status EXAM: CTA ABDOMEN AND PELVIS WITHOUT AND WITH  CONTRAST TECHNIQUE: Multidetector CT imaging of the abdomen and pelvis was performed using the standard protocol during bolus administration of intravenous contrast. Multiplanar reconstructed images and MIPs were obtained and reviewed to evaluate the vascular anatomy. CONTRAST:  177mL OMNIPAQUE IOHEXOL 350 MG/ML SOLN COMPARISON:  CT pelvis dated 09/27/2019 FINDINGS: VASCULAR Aorta: No evidence abdominal aortic aneurysm or dissection. Patent. Atherosclerotic calcifications. Celiac: Patent. SMA:  Patent. Renals: Patent on the right.  Patent but stenotic on the left. IMA: Patent. Inflow: Patent bilaterally.  Atherosclerotic calcifications. Proximal Outflow: Patent bilaterally. Atherosclerotic calcifications. Veins: Grossly unremarkable. No findings of active GI bleeding on CT. See below for pertinent GI findings. Review of the MIP images confirms the above findings. NON-VASCULAR Lower chest: Lung bases are clear. Hepatobiliary: Liver is within normal limits. Status post cholecystectomy. No intrahepatic ductal dilatation. Common duct measures 8 mm and smoothly tapers at the ampulla. Pancreas: Within normal limits. Spleen: Within normal limits. Adrenals/Urinary Tract: Adrenal glands are within normal limits. Severe left renal atrophy. 3.1 x 3.4 cm septated posterior right lower pole renal cyst (series 7/image 44). 1.9 cm anterior right upper pole renal cyst (series 7/image 34). No hydronephrosis. Bladder is within normal limits. Stomach/Bowel: Stomach is notable for a tiny hiatal hernia. No evidence of bowel obstruction. Appendix is not discretely visualized. Sigmoid diverticulosis. Long segment rectosigmoid wall thickening, with associated pericolonic inflammatory changes, suggesting infectious/inflammatory proctocolitis. Masslike thickening involving the low rectum with associated mucosal enhancement (series 7/image 83). This is favored to be related to the patient's colitis, but an underlying rectal neoplasm is  difficult to entirely exclude. However, this region left normal on prior pelvic CT. As noted above, there is no evidence of active GI bleeding on CT, including in this location. Lymphatic: No suspicious abdominopelvic lymphadenopathy. Reproductive: Uterus is within normal limits. Bilateral ovaries are within normal limits. Other: No abdominopelvic ascites. Mild presacral fluid/stranding (series 7/image 70). Musculoskeletal: Status post L2-S1 PLIF. Degenerative changes of the visualized thoracolumbar spine. IMPRESSION: Long segment wall thickening/inflammatory changes involving the rectosigmoid colon, suggesting infectious/inflammatory proctocolitis. Focal wall thickening with mucosal enhancement involving the low rectum, favored to be related to the inflammatory process, although underlying rectal neoplasm is difficult to entirely exclude. Follow-up flexible sigmoidoscopy is suggested. No evidence of active GI bleeding on CT, including in this location. Electronically Signed   By: Julian Hy M.D.   On: 11/15/2020 00:26    Scheduled Meds:  chlorhexidine  15 mL Mouth Rinse BID   Chlorhexidine Gluconate Cloth  6 each Topical Daily   insulin aspart  0-9 Units Subcutaneous Q4H   levothyroxine  50 mcg Intravenous Daily   LORazepam  0.5 mg Intravenous Once   mouth rinse  15 mL Mouth Rinse q12n4p   metoprolol tartrate  12.5 mg Oral BID   pantoprazole (PROTONIX) IV  40 mg Intravenous Daily   sodium chloride flush  3 mL Intravenous Q12H   Continuous Infusions:  ampicillin-sulbactam (UNASYN) IV Stopped (11/16/20 0543)     LOS: 2 days    Time spent: 35 mins    Margarett Viti, MD [facility]   If 7PM-7AM, please contact night-coverage

## 2020-11-16 NOTE — Progress Notes (Signed)
Subjective: Since I last evaluated the patient she is much more awake and alert however remains confused to time and place/situation. She requires max assist to eat and is unable to ambulate at this time. She is wearing mittens as she has not been able to comprehend her medical devices this hospital visit.  She is verbal speaking clearly but ignores questions- she is tangential with her conversation. She has no pain behaviours. I have received word from her nurse yesterday that her family is requesting a colonoscopy. Last was maybe 4y ago- evidently there was one planned 2y ago but cancelled for unknown reason. Family unsure who did this and they do not have the reports.  Has had 2 stools since admission, last today  type 7 with some red streakly material in [place] not been having any noted abdominal pain. She is on unasyn for pneumonia. She is on aricept and magnesiuim at home which can contribute to diarrhea as can her diabetes and wellbutrin   Objective: Vital signs in last 24 hours: Temp:  [97.7 F (36.5 C)-98.5 F (36.9 C)] 97.7 F (36.5 C) (11/15 0735) Pulse Rate:  [80-122] 85 (11/15 0735) Resp:  [16-18] 18 (11/15 0516) BP: (96-139)/(61-89) 96/69 (11/15 0735) SpO2:  [93 %-99 %] 97 % (11/15 0735)    Intake/Output from previous day: 11/14 0701 - 11/15 0700 In: 496.1 [P.O.:100; IV Piggyback:396.1] Out: 200 [Urine:200] Intake/Output this shift: Total I/O In: 120 [P.O.:120] Out: -   General appearance: alert, delirious, distracted, no distress, and moderately obese Resp: clear to auscultation bilaterally Cardio: regular rate and rhythm, S1, S2 normal, no murmur, click, rub or gallop GI: soft, non-tender; bowel sounds normal; no masses,  no organomegaly Extremities: extremities normal, atraumatic, no cyanosis or edema  Lab Results: Recent Labs    11/14/20 1811 11/15/20 0611 11/16/20 0446  WBC 16.6* 16.1* 14.7*  HGB 14.4 14.6 12.2  HCT 44.9 44.1 36.4  PLT 223 250 219    BMET Recent Labs    11/14/20 1811 11/15/20 0611 11/16/20 0446  NA 137 139 139  K 4.3 4.1 3.6  CL 100 102 104  CO2 28 26 27   GLUCOSE 200* 162* 131*  BUN 27* 25* 31*  CREATININE 1.19* 1.16* 1.47*  CALCIUM 9.3 8.9 8.6*   LFT Recent Labs    11/14/20 1811  PROT 7.1  ALBUMIN 3.6  AST 26  ALT 20  ALKPHOS 95  BILITOT 1.0   PT/INR No results for input(s): LABPROT, INR in the last 72 hours. Hepatitis Panel No results for input(s): HEPBSAG, HCVAB, HEPAIGM, HEPBIGM in the last 72 hours. C-Diff Recent Labs    11/15/20 1400  CDIFFTOX NEGATIVE   No results for input(s): CDIFFPCR in the last 72 hours. Fecal Lactopherrin No results for input(s): FECLLACTOFRN in the last 72 hours.  Studies/Results: CT HEAD WO CONTRAST (5MM)  Result Date: 11/16/2020 CLINICAL DATA:  Altered mental status EXAM: CT HEAD WITHOUT CONTRAST TECHNIQUE: Contiguous axial images were obtained from the base of the skull through the vertex without intravenous contrast. COMPARISON:  11/14/2020 FINDINGS: Brain: There is no evidence of recent bleeding within the cranium. There is no focal mass effect. There is no shift of midline structures. Cortical sulci are prominent. There is decreased density in the subcortical and periventricular white matter. Vascular: There are scattered arterial calcifications. Skull: No fracture is seen in the calvarium. There is interval decrease in the left periorbital contusion/hematoma in the scalp. Sinuses/Orbits: There is possible 1.4 x 0.7 cm polyp in the  anterior left nasal cavity. Other: None IMPRESSION: No acute intracranial findings are seen. There are no signs of bleeding. Atrophy. Small-vessel disease. There is interval decrease in subcutaneous contusion/hematoma in the left periorbital region and left frontal scalp. There is possible polyp in the anterior left nasal cavity. Electronically Signed   By: Ernie Avena M.D.   On: 11/16/2020 12:06   CT HEAD WO CONTRAST  ( )  Result Date: 11/14/2020 CLINICAL DATA:  Altered mental status, fall EXAM: CT HEAD WITHOUT CONTRAST TECHNIQUE: Contiguous axial images were obtained from the base of the skull through the vertex without intravenous contrast. COMPARISON:  11/13/2020 FINDINGS: Brain: There is atrophy and chronic small vessel disease changes. No acute intracranial abnormality. Specifically, no hemorrhage, hydrocephalus, mass lesion, acute infarction, or significant intracranial injury. Vascular: No hyperdense vessel or unexpected calcification. Skull: No acute calvarial abnormality. Sinuses/Orbits: No acute findings Other: None IMPRESSION: Atrophy, chronic microvascular disease. No acute intracranial abnormality. Electronically Signed   By: Charlett Nose M.D.   On: 11/14/2020 17:54   CT Angio Chest Pulmonary Embolism (PE) W or WO Contrast  Result Date: 11/15/2020 CLINICAL DATA:  Pulmonary embolism suspected EXAM: CT ANGIOGRAPHY CHEST WITH CONTRAST TECHNIQUE: Multidetector CT imaging of the chest was performed using the standard protocol during bolus administration of intravenous contrast. Multiplanar CT image reconstructions and MIPs were obtained to evaluate the vascular anatomy. CONTRAST:  89mL OMNIPAQUE IOHEXOL 350 MG/ML SOLN COMPARISON:  None. FINDINGS: Cardiovascular: Satisfactory opacification of the pulmonary arteries to the segmental level. No evidence of pulmonary embolism. Normal heart size. No pericardial effusion. Aortic atherosclerosis. Dual-chamber pacer leads into the right heart. Mediastinum/Nodes: Negative for adenopathy or mass. Lungs/Pleura: Patchy ground-glass opacity in the right upper lobe. Mild atelectasis at the lung bases. Upper Abdomen: Marked atrophy of the left kidney with very poor enhancement. Atherosclerosis especially notable at the origin of the celiac. 7 mm splenic artery aneurysm. Musculoskeletal: Advanced and diffuse degenerative disease with spurring and endplate irregularity. 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 No acute osseous finding. Review of the MIP images confirms the above findings. IMPRESSION: 1. Mild right upper lobe pneumonia. 2. Negative for pulmonary embolism. 3.  Aortic Atherosclerosis (ICD10-I70.0). 4. Advanced left renal atrophy. Electronically Signed   By: Tiburcio Pea M.D.   On: 11/15/2020 05:00   CT ANGIO GI BLEED  Result Date: 11/15/2020 CLINICAL DATA:  GI bleed, altered mental status EXAM: CTA ABDOMEN AND PELVIS WITHOUT AND WITH CONTRAST TECHNIQUE: Multidetector CT imaging of the abdomen and pelvis was performed using the standard protocol during bolus administration of intravenous contrast. Multiplanar reconstructed images and MIPs were obtained and reviewed to evaluate the vascular anatomy. CONTRAST:  OMNIPAQUE IOHEXOL 350 MG/ML SOLN COMPARISON:  CT pelvis dated 09/27/2019 FINDINGS: VASCULAR Aorta: No evidence abdominal aortic aneurysm or dissection. Patent. Atherosclerotic calcifications. Celiac: Patent. SMA: Patent. Renals: Patent on the right.  Patent but stenotic on the left. IMA: Patent. Inflow: Patent bilaterally.  Atherosclerotic calcifications. Proximal Outflow: Patent bilaterally. Atherosclerotic calcifications. Veins: Grossly unremarkable. No findings of active GI bleeding on CT. See below for pertinent GI findings. Review of the MIP images confirms the above findings. NON-VASCULAR Lower chest: Lung bases are clear. Hepatobiliary: Liver is within normal limits. Status post cholecystectomy. No intrahepatic ductal dilatation. Common duct measures 8 mm and smoothly tapers at the ampulla. Pancreas: Within normal limits. Spleen: Within normal limits. Adrenals/Urinary Tract: Adrenal glands are within normal limits. Severe left renal atrophy. 3.1 x 3.4 cm septated posterior right lower pole renal cyst (series 7/image 44). 1.9  cm anterior right upper pole renal cyst (series 7/image 34). No hydronephrosis. Bladder is within normal limits. Stomach/Bowel: Stomach  is notable for a tiny hiatal hernia. No evidence of bowel obstruction. Appendix is not discretely visualized. Sigmoid diverticulosis. Long segment rectosigmoid wall thickening, with associated pericolonic inflammatory changes, suggesting infectious/inflammatory proctocolitis. Masslike thickening involving the low rectum with associated mucosal enhancement (series 7/image 83). This is favored to be related to the patient's colitis, but an underlying rectal neoplasm is difficult to entirely exclude. However, this region left normal on prior pelvic CT. As noted above, there is no evidence of active GI bleeding on CT, including in this location. Lymphatic: No suspicious abdominopelvic lymphadenopathy. Reproductive: Uterus is within normal limits. Bilateral ovaries are within normal limits. Other: No abdominopelvic ascites. Mild presacral fluid/stranding (series 7/image 70). Musculoskeletal: Status post L2-S1 PLIF. Degenerative changes of the visualized thoracolumbar spine. IMPRESSION: Long segment wall thickening/inflammatory changes involving the rectosigmoid colon, suggesting infectious/inflammatory proctocolitis. Focal wall thickening with mucosal enhancement involving the low rectum, favored to be related to the inflammatory process, although underlying rectal neoplasm is difficult to entirely exclude. Follow-up flexible sigmoidoscopy is suggested. No evidence of active GI bleeding on CT, including in this location. Electronically Signed   By: Julian Hy M.D.   On: 11/15/2020 00:26    Medications: I have reviewed the patient's current medications.   Assessment/Plan:  . 1. Abnormal CT rectosigmoid colon/diarrhea- has had 2 stools since admission, cdiff and PCR negative. Could consider flexible sigmoidoscopy with tap water enema for prep- it is unlikely that patient would be able to complete bowel prep at this point due to her limited cognition   LOS: 2 days   Araceli Coufal, [place] 11/16/2020,  2:05 PM

## 2020-11-16 NOTE — Progress Notes (Signed)
Pt alert and disorientedx3. Calm but at times does not follow commands. V/S stable. Loose BM noted this shift with blood streaks. Complained of abdominal pain; tylenol given.

## 2020-11-16 NOTE — Progress Notes (Signed)
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 The patient's initial ECG was abnormal and notable for a RBBB thus cardiology was consulted. The patient's ECG revealed atrial-sensed ventricular pace rhythm with RBBB and appears similar to that of her previous ECG's from 2021 and 2020.   SUBJECTIVE: UTA. The patient answerers questions inappropriately.   OBJECTIVE: The patient appears chronically ill but today she is more alert. She does not answer questions appropriately and is disoriented to place, time and situation. The patient's family member is at the bedside and states that the patient has not complained of any pain and that she has not appeared to be short of breath. The patient's HR has improved and is now in the 80's bpm. She is sitting comfortably in bed eating lunch with the assistance of her family member.   Vitals:   11/15/20 2035 11/15/20 2256 11/16/20 0516 11/16/20 0735  BP: 113/75  130/61 96/69  Pulse: (!) 122 80 82 85  Resp: 18  18   Temp: 98.5 F (36.9 C)  97.8 F (36.6 C) 97.7 F (36.5 C)  TempSrc: Oral  Oral   SpO2: 97%  98% 97%  Weight:         Intake/Output Summary (Last 24 hours) at 11/16/2020 1231 Last data filed at 11/16/2020 0950 Gross per 24 hour  Intake 616.13 ml  Output 200 ml  Net 416.13 ml      PHYSICAL EXAM General: alert to self, disoriented x3, well nourished, in no acute distress HEENT:  Normocephalic and left frontal hematoma Neck:   No JVD.  Lungs: Clear bilaterally to auscultation. Chest expansion symmetrical.  Heart: HRRR . Normal S1 and S2 with grade 2/6 murmur. No gallops.  Abdomen: Bowel sounds  are positive, abdomen soft and non-tender  Msk:  decreased strength and tone for age. Extremities: moderate, nonpitting LLE edema. No clubbing or cyanosis. Neuro: disoriented X 3. Able to move all four limbs, 4/5 strength in all extremities  Psych:  responds inappropriately   LABS: Basic Metabolic Panel: Recent Labs    11/15/20 0611 11/16/20 0446  NA 139 139  K 4.1 3.6  CL 102 104  CO2 26 27  GLUCOSE 162* 131*  BUN 25* 31*  CREATININE 1.16* 1.47*  CALCIUM 8.9 8.6*  MG  --  1.7  PHOS  --  3.8   Liver Function Tests: Recent Labs    11/14/20 1811  AST 26  ALT 20  ALKPHOS 95  BILITOT 1.0  PROT 7.1  ALBUMIN 3.6   No results for input(s): LIPASE, AMYLASE in the last 72 hours. CBC: Recent Labs    11/14/20 1811 11/15/20 0611 11/16/20 0446  WBC 16.6* 16.1* 14.7*  NEUTROABS 14.9*  --   --   HGB 14.4 14.6 12.2  HCT 44.9 44.1 36.4  MCV 93.9 92.8 93.3  PLT 223 250 219   Cardiac Enzymes: Recent Labs    11/15/20 0330  CKTOTAL 54   BNP: Invalid input(s): POCBNP D-Dimer: Recent Labs    11/15/20 0330  DDIMER 17.57*   Hemoglobin A1C: Recent Labs    11/14/20 2255  HGBA1C 6.8*   Fasting Lipid Panel: No results for input(s): CHOL, HDL, LDLCALC, TRIG, CHOLHDL,  LDLDIRECT in the last 72 hours. Thyroid Function Tests: Recent Labs    11/14/20 2255  TSH 4.648*   Anemia Panel: No results for input(s): VITAMINB12, FOLATE, FERRITIN, TIBC, IRON, RETICCTPCT in the last 72 hours.  CT HEAD WO CONTRAST (5MM)  Result Date: 11/16/2020 CLINICAL DATA:  Altered mental status EXAM: CT HEAD WITHOUT CONTRAST TECHNIQUE: Contiguous axial images were obtained from the base of the skull through the vertex without intravenous contrast. COMPARISON:  11/14/2020 FINDINGS: Brain: There is no evidence of recent bleeding within the cranium. There is no focal mass effect. There is no shift of midline structures. Cortical sulci are prominent. There is decreased density in the subcortical  and periventricular [place] Ontko matter. Vascular: There are scattered arterial calcifications. Skull: No fracture is seen in the calvarium. There is interval decrease in the left periorbital contusion/hematoma in the scalp. Sinuses/Orbits: There is possible 1.4 x 0.7 cm polyp in the anterior left nasal cavity. Other: None IMPRESSION: No acute intracranial findings are seen. There are no signs of bleeding. Atrophy. Small-vessel disease. There is interval decrease in subcutaneous contusion/hematoma in the left periorbital region and left frontal scalp. There is possible polyp in the anterior left nasal cavity. Electronically Signed   By: Elmer Picker M.D.   On: 11/16/2020 12:06   CT HEAD WO CONTRAST (5MM)  Result Date: 11/14/2020 CLINICAL DATA:  Altered mental status, fall EXAM: CT HEAD WITHOUT CONTRAST TECHNIQUE: Contiguous axial images were obtained from the base of the skull through the vertex without intravenous contrast. COMPARISON:  11/13/2020 FINDINGS: Brain: There is atrophy and chronic small vessel disease changes. No acute intracranial abnormality. Specifically, no hemorrhage, hydrocephalus, mass lesion, acute infarction, or significant intracranial injury. Vascular: No hyperdense vessel or unexpected calcification. Skull: No acute calvarial abnormality. Sinuses/Orbits: No acute findings Other: None IMPRESSION: Atrophy, chronic microvascular disease. No acute intracranial abnormality. Electronically Signed   By: Rolm Baptise M.D.   On: 11/14/2020 17:54   CT Angio Chest Pulmonary Embolism (PE) W or WO Contrast  Result Date: 11/15/2020 CLINICAL DATA:  Pulmonary embolism suspected EXAM: CT ANGIOGRAPHY CHEST WITH CONTRAST TECHNIQUE: Multidetector CT imaging of the chest was performed using the standard protocol during bolus administration of intravenous contrast. Multiplanar CT image reconstructions and MIPs were obtained to evaluate the vascular anatomy. CONTRAST:  38mL OMNIPAQUE IOHEXOL 350 MG/ML  SOLN COMPARISON:  None. FINDINGS: Cardiovascular: Satisfactory opacification of the pulmonary arteries to the segmental level. No evidence of pulmonary embolism. Normal heart size. No pericardial effusion. Aortic atherosclerosis. Dual-chamber pacer leads into the right heart. Mediastinum/Nodes: Negative for adenopathy or mass. Lungs/Pleura: Patchy ground-glass opacity in the right upper lobe. Mild atelectasis at the lung bases. Upper Abdomen: Marked atrophy of the left kidney with very poor enhancement. Atherosclerosis especially notable at the origin of the celiac. 7 mm splenic artery aneurysm. Musculoskeletal: Advanced and diffuse degenerative disease with spurring and endplate irregularity. 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 No acute osseous finding. Review of the MIP images confirms the above findings. IMPRESSION: 1. Mild right upper lobe pneumonia. 2. Negative for pulmonary embolism. 3.  Aortic Atherosclerosis (ICD10-I70.0). 4. Advanced left renal atrophy. Electronically Signed   By: Jorje Guild M.D.   On: 11/15/2020 05:00   CT ANGIO GI BLEED  Result Date: 11/15/2020 CLINICAL DATA:  GI bleed, altered mental status EXAM: CTA ABDOMEN AND PELVIS WITHOUT AND WITH CONTRAST TECHNIQUE: Multidetector CT imaging of the abdomen and pelvis was performed using the standard protocol during bolus administration of intravenous contrast. Multiplanar reconstructed images and MIPs were  obtained and reviewed to evaluate the vascular anatomy. CONTRAST:  181mL OMNIPAQUE IOHEXOL 350 MG/ML SOLN COMPARISON:  CT pelvis dated 09/27/2019 FINDINGS: VASCULAR Aorta: No evidence abdominal aortic aneurysm or dissection. Patent. Atherosclerotic calcifications. Celiac: Patent. SMA: Patent. Renals: Patent on the right.  Patent but stenotic on the left. IMA: Patent. Inflow: Patent bilaterally.  Atherosclerotic calcifications. Proximal Outflow: Patent bilaterally. Atherosclerotic calcifications. Veins: Grossly unremarkable. No findings of  active GI bleeding on CT. See below for pertinent GI findings. Review of the MIP images confirms the above findings. NON-VASCULAR Lower chest: Lung bases are clear. Hepatobiliary: Liver is within normal limits. Status post cholecystectomy. No intrahepatic ductal dilatation. Common duct measures 8 mm and smoothly tapers at the ampulla. Pancreas: Within normal limits. Spleen: Within normal limits. Adrenals/Urinary Tract: Adrenal glands are within normal limits. Severe left renal atrophy. 3.1 x 3.4 cm septated posterior right lower pole renal cyst (series 7/image 44). 1.9 cm anterior right upper pole renal cyst (series 7/image 34). No hydronephrosis. Bladder is within normal limits. Stomach/Bowel: Stomach is notable for a tiny hiatal hernia. No evidence of bowel obstruction. Appendix is not discretely visualized. Sigmoid diverticulosis. Long segment rectosigmoid wall thickening, with associated pericolonic inflammatory changes, suggesting infectious/inflammatory proctocolitis. Masslike thickening involving the low rectum with associated mucosal enhancement (series 7/image 83). This is favored to be related to the patient's colitis, but an underlying rectal neoplasm is difficult to entirely exclude. However, this region left normal on prior pelvic CT. As noted above, there is no evidence of active GI bleeding on CT, including in this location. Lymphatic: No suspicious abdominopelvic lymphadenopathy. Reproductive: Uterus is within normal limits. Bilateral ovaries are within normal limits. Other: No abdominopelvic ascites. Mild presacral fluid/stranding (series 7/image 70). Musculoskeletal: Status post L2-S1 PLIF. Degenerative changes of the visualized thoracolumbar spine. IMPRESSION: Long segment wall thickening/inflammatory changes involving the rectosigmoid colon, suggesting infectious/inflammatory proctocolitis. Focal wall thickening with mucosal enhancement involving the low rectum, favored to be related to the  inflammatory process, although underlying rectal neoplasm is difficult to entirely exclude. Follow-up flexible sigmoidoscopy is suggested. No evidence of active GI bleeding on CT, including in this location. Electronically Signed   By: Julian Hy M.D.   On: 11/15/2020 00:26     Echo: pending   TELEMETRY: atrial sensed, ventricular paced rhythm with RBBB and HR of 82 bpm  ASSESSMENT AND PLAN:  Principal Problem:   AMS (altered mental status) Active Problems:   Chronic kidney disease (CKD), stage III (moderate) (HCC)   Essential hypertension   Memory loss   Type 2 diabetes mellitus with stage 3 chronic kidney disease, without long-term current use of insulin (HCC)   Hypomagnesemia   GI bleed    Atrial-sensed Ventricular Paced rhythm with RBBB # Tachycardia # Mobitz type I and type II s/p PPM (11/2016) ECG remarkable for atrial sensed ventricular paced rhythm with a right bundle branch block and a heart rate of 104 bpm which is consistent with previous ECG's from 2021 and 2020. The patient appears to be asymptomatic and the high sensitive troponins were negative x4. Tachycardia has resolved.               - Echocardiogram pending for further evaluation of LV function and EF.              -Continuous cardiac monitoring until discharge.             - Consider metoprolol tartrate if the patient become tachycardic.               #  HTN Patient is normotensive at this time.              - continue hydralazine IV PRN systolic bp >160.             - consider beta blocker as above to help with BP management.              - continue to Hold losartan at this time in the presence of acute on chronic kidney disease.    # HLD # DM Type II             -Continue atorvastatin therapy.             -Agree with current sliding scale insulin per protocol.     # GUAC positive likely secondary to GI bleed              -GI consult appreciated.    # acute on chronic CKD              -  Recommend holding ARB as above.  - trend BMP.    # OSA             -Recommend nightly use of CPAP machine is indicated per RT.    #  AMS with baseline Dementia  # Acute metabolic encephalopathy  # Sepsis  # Right upper lobe Pneumonia             - Agree with current management.              - Agree with antibiotic therapy.   Daveena Elmore, ACNPC-AG  11/16/2020 12:31 PM

## 2020-11-16 NOTE — Care Plan (Signed)
Will plan for flex sig tomorrow. Spoke with daughter who is nurse here and agrees with proceeding understanding risks/benefits. A diagnosis of cancer would change treatment plans for her mother so a flex sig is reasonable in that instance.  Merlyn Lot MD, MPH [facility] GI

## 2020-11-17 ENCOUNTER — Encounter
Admission: EM | Disposition: A | Discharge status: Skilled Nursing Facility | Payer: Self-pay | Source: Home / Self Care | Attending: Internal Medicine

## 2020-11-17 ENCOUNTER — Inpatient Hospital Stay
Admit: 2020-11-17 | Discharge: 2020-11-17 | Disposition: A | Discharge status: Home / Self Care | Payer: Medicare Other | Attending: Student | Admitting: Student

## 2020-11-17 ENCOUNTER — Inpatient Hospital Stay: Payer: Medicare Other | Admitting: Certified Registered"

## 2020-11-17 DIAGNOSIS — E1122 Type 2 diabetes mellitus with diabetic chronic kidney disease: Secondary | ICD-10-CM | POA: Diagnosis not present

## 2020-11-17 DIAGNOSIS — R4182 Altered mental status, unspecified: Secondary | ICD-10-CM | POA: Diagnosis not present

## 2020-11-17 DIAGNOSIS — K922 Gastrointestinal hemorrhage, unspecified: Secondary | ICD-10-CM | POA: Diagnosis not present

## 2020-11-17 DIAGNOSIS — N183 Chronic kidney disease, stage 3 unspecified: Secondary | ICD-10-CM | POA: Diagnosis not present

## 2020-11-17 HISTORY — PX: FLEXIBLE SIGMOIDOSCOPY: SHX5431

## 2020-11-17 LAB — GLUCOSE, CAPILLARY
Glucose-Capillary: 140 mg/dL — ABNORMAL HIGH (ref 70–99)
Glucose-Capillary: 125 mg/dL — ABNORMAL HIGH (ref 70–99)
Glucose-Capillary: 114 mg/dL — ABNORMAL HIGH (ref 70–99)
Glucose-Capillary: 207 mg/dL — ABNORMAL HIGH (ref 70–99)
Glucose-Capillary: 176 mg/dL — ABNORMAL HIGH (ref 70–99)
Glucose-Capillary: 113 mg/dL — ABNORMAL HIGH (ref 70–99)

## 2020-11-17 LAB — COMPREHENSIVE METABOLIC PANEL
ALT: 14 U/L (ref 0–44)
AST: 19 U/L (ref 15–41)
Albumin: 2.9 g/dL — ABNORMAL LOW (ref 3.5–5.0)
Alkaline Phosphatase: 70 U/L (ref 38–126)
Anion gap: 9 (ref 5–15)
BUN: 28 mg/dL — ABNORMAL HIGH (ref 8–23)
CO2: 27 mmol/L (ref 22–32)
Calcium: 8.6 mg/dL — ABNORMAL LOW (ref 8.9–10.3)
Chloride: 103 mmol/L (ref 98–111)
Creatinine, Ser: 1.23 mg/dL — ABNORMAL HIGH (ref 0.44–1.00)
GFR, Estimated: 45 mL/min — ABNORMAL LOW (ref >60)
Glucose, Bld: 122 mg/dL — ABNORMAL HIGH (ref 70–99)
Potassium: 3.4 mmol/L — ABNORMAL LOW (ref 3.5–5.1)
Sodium: 139 mmol/L (ref 135–145)
Total Bilirubin: 0.7 mg/dL (ref 0.3–1.2)
Total Protein: 6.4 g/dL — ABNORMAL LOW (ref 6.5–8.1)

## 2020-11-17 LAB — ECHOCARDIOGRAM COMPLETE
AR max vel: 0.92 cm2
AV Area VTI: 0.99 cm2
AV Area mean vel: 0.89 cm2
AV Mean grad: 11.3 mmHg
AV Peak grad: 19 mmHg
Ao pk vel: 2.18 m/s
Area-P 1/2: 4.57 cm2
MV VTI: 1.3 cm2
S' Lateral: 2.5 cm
Weight: 2525.59 oz

## 2020-11-17 LAB — CBC
HCT: 34.2 % — ABNORMAL LOW (ref 36.0–46.0)
Hemoglobin: 11.1 g/dL — ABNORMAL LOW (ref 12.0–15.0)
MCH: 29.8 pg (ref 26.0–34.0)
MCHC: 32.5 g/dL (ref 30.0–36.0)
MCV: 91.7 fL (ref 80.0–100.0)
Platelets: 221 10*3/uL (ref 150–400)
RBC: 3.73 MIL/uL — ABNORMAL LOW (ref 3.87–5.11)
RDW: 13.6 % (ref 11.5–15.5)
WBC: 12.3 10*3/uL — ABNORMAL HIGH (ref 4.0–10.5)
nRBC: 0 % (ref 0.0–0.2)

## 2020-11-17 LAB — MAGNESIUM: Magnesium: 1.6 mg/dL — ABNORMAL LOW (ref 1.7–2.4)

## 2020-11-17 LAB — PHOSPHORUS: Phosphorus: 2.5 mg/dL (ref 2.5–4.6)

## 2020-11-17 SURGERY — SIGMOIDOSCOPY, FLEXIBLE
Anesthesia: General

## 2020-11-17 MED ORDER — LIDOCAINE HCL (CARDIAC) PF 100 MG/5ML IV SOSY
PREFILLED_SYRINGE | INTRAVENOUS | Status: DC | PRN
  Administered 2020-11-17: 50 mg via INTRAVENOUS

## 2020-11-17 MED ORDER — HALOPERIDOL LACTATE 5 MG/ML IJ SOLN
1.0000 mg | Freq: Four times a day (QID) | INTRAMUSCULAR | Status: AC | PRN
  Administered 2020-11-17: 1 mg via INTRAVENOUS
  Filled 2020-11-17: qty 1

## 2020-11-17 MED ORDER — SODIUM CHLORIDE 0.9 % IV SOLN: INTRAVENOUS | Status: DC | PRN

## 2020-11-17 MED ORDER — PHENYLEPHRINE HCL (PRESSORS) 10 MG/ML IV SOLN
INTRAVENOUS | Status: DC | PRN
  Administered 2020-11-17: 100 ug via INTRAVENOUS

## 2020-11-17 MED ORDER — PROPOFOL 10 MG/ML IV BOLUS
INTRAVENOUS | Status: DC | PRN
  Administered 2020-11-17: 50 mg via INTRAVENOUS

## 2020-11-17 MED ORDER — PROPOFOL 500 MG/50ML IV EMUL
INTRAVENOUS | Status: DC | PRN
  Administered 2020-11-17: 150 ug/kg/min via INTRAVENOUS

## 2020-11-17 NOTE — Progress Notes (Addendum)
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 The patient's initial ECG was abnormal and notable for a RBBB thus cardiology was consulted. The patient's ECG revealed atrial-sensed ventricular pace rhythm with RBBB and appears similar to that of her previous ECG's from 2021 and 2020.   SUBJECTIVE: UTA. The patient answerers questions inappropriately, but does state that she is hungry. (Patient is currently NPO for GI procedure)  (11/16/20): UTA. The patient answerers questions inappropriately  OBJECTIVE: The patient appears chronically ill but continues to be more alert on today.  She is alert to herself only. She continues to be atrial-sensed ventricular-paced rhythm with a heart rate in the 80's on the bedside telemetry monitor. Her respirations are unlabored on room air and she does not appear to be in any distress at this time. She is resting comfortably in bed with rails up.   (11/16/20): The patient appears chronically ill but today she is more alert. She does not answer questions appropriately and is disoriented to place, time and situation. The patient's family member is at the bedside and states that the patient has not complained of any pain and that she has not appeared to be short of breath. The patient's HR has improved and is now in the 80's bpm. She is sitting comfortably in bed eating lunch with the assistance of her family member.   Vitals:   11/16/20 1625 11/16/20 2109 11/17/20 0611 11/17/20 0746  BP: (!) 115/58 133/89 (!) 157/76 (!) 144/72  Pulse: 84 80 78 73  Resp:  14 18   Temp: 97.9 F (36.6 C) 99.2 F (37.3 C) 98.5 F (36.9 C)  97.9 F (36.6 C)  TempSrc:  Oral Axillary   SpO2: 95% 97% 95% 95%  Weight:         Intake/Output Summary (Last 24 hours) at 11/17/2020 0951 Last data filed at 11/17/2020 0316 Gross per 24 hour  Intake 420 ml  Output --  Net 420 ml      PHYSICAL EXAM General: alert to self, disoriented x3, well nourished, in no acute distress HEENT:  Normocephalic and left frontal hematoma. PERRL Neck:   No JVD.  Lungs: Clear bilaterally to auscultation. Chest expansion symmetrical. normal effort of breathing.  Heart: HRRR . Normal S1 and S2 with grade 2/6 murmur. No gallops.  Abdomen: Bowel sounds are positive, abdomen soft and non-tender  Msk:  decreased strength and tone for age. Extremities: moderate, nonpitting LLE edema. No clubbing or cyanosis. Neuro: disoriented X 3. Able to move all four limbs, 4/5 strength in all extremities  Psych:  responds inappropriately   LABS: Basic Metabolic Panel: Recent Labs    11/16/20 0446 11/17/20 0526  NA 139 139  K 3.6 3.4*  CL 104 103  CO2 27 27  GLUCOSE 131* 122*  BUN 31* 28*  CREATININE 1.47* 1.23*  CALCIUM 8.6* 8.6*  MG 1.7 1.6*  PHOS 3.8 2.5   Liver Function Tests: Recent Labs    11/14/20 1811 11/17/20 0526  AST 26 19  ALT 20 14  ALKPHOS 95 70  BILITOT 1.0 0.7  PROT 7.1 6.4*  ALBUMIN 3.6 2.9*   No results for input(s): LIPASE, AMYLASE in the last 72 hours.  CBC: Recent Labs    11/14/20 1811 11/15/20 0611 11/16/20 0446 11/17/20 0526  WBC 16.6*   < > 14.7* 12.3*  NEUTROABS 14.9*  --   --   --   HGB 14.4   < > 12.2 11.1*  HCT 44.9   < > 36.4 34.2*  MCV 93.9   < > 93.3 91.7  PLT 223   < > 219 221   < > = values in this interval not displayed.   Cardiac Enzymes: Recent Labs    11/15/20 0330  CKTOTAL 54   BNP: Invalid input(s): POCBNP D-Dimer: Recent Labs    11/15/20 0330  DDIMER 17.57*   Hemoglobin A1C: Recent Labs    11/14/20 2255  HGBA1C 6.8*   Fasting Lipid Panel: No results for input(s): CHOL,  HDL, LDLCALC, TRIG, CHOLHDL, LDLDIRECT in the last 72 hours. Thyroid Function Tests: Recent Labs    11/14/20 2255  TSH 4.648*   Anemia Panel: No results for input(s): VITAMINB12, FOLATE, FERRITIN, TIBC, IRON, RETICCTPCT in the last 72 hours.  CT HEAD WO CONTRAST ( )  Result Date: 11/16/2020 CLINICAL DATA:  Altered mental status EXAM: CT HEAD WITHOUT CONTRAST TECHNIQUE: Contiguous axial images were obtained from the base of the skull through the vertex without intravenous contrast. COMPARISON:  11/14/2020 FINDINGS: Brain: There is no evidence of recent bleeding within the cranium. There is no focal mass effect. There is no shift of midline structures. Cortical sulci are prominent. There is decreased density in the subcortical and periventricular Chelsea Ruiz matter. Vascular: There are scattered arterial calcifications. Skull: No fracture is seen in the calvarium. There is interval decrease in the left periorbital contusion/hematoma in the scalp. Sinuses/Orbits: There is possible 1.4 x 0.7 cm polyp in the anterior left nasal cavity. Other: None IMPRESSION: No acute intracranial findings are seen. There are no signs of bleeding. Atrophy. Small-vessel disease. There is interval decrease in subcutaneous contusion/hematoma in the left periorbital region and left frontal scalp. There is possible polyp in the anterior left nasal cavity. Electronically Signed   By: Chelsea Ruiz M.D.   On: 11/16/2020 12:06     Echo: pending results.   TELEMETRY: atrial sensed, ventricular paced rhythm with RBBB and HR of 84 bpm  ASSESSMENT AND PLAN:  Principal Problem:   AMS (altered mental status) Active Problems:   Chronic kidney disease (CKD), stage III (moderate) (HCC)   Essential hypertension   Memory loss   Type 2 diabetes mellitus with stage 3 chronic kidney disease, without long-term current use of insulin (HCC)   Hypomagnesemia   GI bleed    Atrial-sensed Ventricular Paced rhythm with RBBB #  Tachycardia # Mobitz type I and type II s/p PPM (11/2016) ECG remarkable for atrial sensed ventricular paced rhythm with a right bundle branch block and a heart rate of 104 bpm which is consistent with previous ECG's from 2021 and 2020. The patient appears to be asymptomatic and the high sensitive troponins were negative x4. Tachycardia has now resolved.               - Echocardiogram pending.             -Continuous cardiac monitoring until discharge.             - Consider metoprolol tartrate if the patient become tachycardic.   # Hypokalemia # Hypomagnesia  Potassium slightly decreased at 3.4 and magnesium decreased at 1.6 on today.   -Recommend replacing electrolytes per protocol and repeat BMP.               #  HTN Patient is normotensive at this time.              - continue hydralazine IV PRN systolic bp [id number].             - consider beta blocker as above to help with BP management.              - continue to Hold losartan at this time in the presence of acute on chronic kidney disease.    # HLD # DM Type II             -Continue atorvastatin therapy.             -Agree with current sliding scale insulin per protocol.     # GUAC positive likely secondary to GI bleed              -GI consult appreciated and there is plan for a flexible sigmoidoscopy on today.   # acute on chronic CKD              - Recommend holding ARB as above.  - trend BMP.  (BUN = 28/creatinine = 1.23 on today. Improved from 31/1.47 on yesterday)  # OSA             -Recommend nightly use of CPAP machine is indicated per RT.    #  AMS with baseline Dementia  # Acute metabolic encephalopathy  # Sepsis  # Right upper lobe Pneumonia             - Agree with current management.              - Agree with antibiotic therapy.   [place], [id number]-AG  11/17/2020 9:51 AM

## 2020-11-17 NOTE — Transfer of Care (Signed)
Immediate Anesthesia Transfer of Care Note  Patient: Chelsea Ruiz  Procedure(s) Performed: FLEXIBLE SIGMOIDOSCOPY  Patient Location: PACU and Endoscopy Unit  Anesthesia Type:General  Level of Consciousness: drowsy  Airway & Oxygen Therapy: Patient Spontanous Breathing  Post-op Assessment: Report given to RN and Post -op Vital signs reviewed and stable  Post vital signs: Reviewed and stable  Last Vitals:  Vitals Value Taken Time  BP    Temp    Pulse 65 11/17/20 1117  Resp 13 11/17/20 1117  SpO2 94 % 11/17/20 1117  Vitals shown include unvalidated device data.  Last Pain:  Vitals:   11/17/20 1034  TempSrc: Temporal  PainSc: 0-No pain         Complications: No notable events documented.

## 2020-11-17 NOTE — Care Plan (Signed)
Flex sig showed ulcerated mucosa from rectum to 30 cm. Had the appearance of ischemic colitis but will need to wait for the biopsies. No specific recommendations for ischemic colitis besides avoiding hypotension. We will follow peripherally, please call with any questions or concerns. Attempted to call daughter but she mentioned yesterday that she would be working during the day time.  Merlyn Lot MD, MPH [facility] GI

## 2020-11-17 NOTE — Progress Notes (Signed)
OT Cancellation Note  Patient Details Name: Chelsea Ruiz MRN: [id number] DOB: 12/30/1940   Cancelled Treatment:    Reason Eval/Treat Not Completed: Patient at procedure or test/ unavailable. OT order received, chart reviewed. Upon arrival to unit, pt noted to be OTF for procedure. Will hold at this time and re-attempt at a later time/date as available and pt medically appropriate for OT eval. Thank you.  Rockney Ghee, M.S., OTR/L [employer] Ascom: [phone] 11/17/20, 11:09 AM

## 2020-11-17 NOTE — Anesthesia Preprocedure Evaluation (Signed)
Anesthesia Evaluation  Patient identified by MRN, date of birth, ID band Patient awake    Reviewed: Allergy & Precautions, NPO status , Patient's Chart, lab work & pertinent test results  Airway Mallampati: III  TM Distance: >3 FB Neck ROM: full    Dental  (+) Chipped   Pulmonary neg pulmonary ROS,    Pulmonary exam normal        Cardiovascular hypertension, negative cardio ROS Normal cardiovascular exam     Neuro/Psych negative neurological ROS  negative psych ROS   GI/Hepatic negative GI ROS, Neg liver ROS,   Endo/Other  negative endocrine ROSdiabetes  Renal/GU negative Renal ROS  negative genitourinary   Musculoskeletal   Abdominal Normal abdominal exam  (+)   Peds  Hematology negative hematology ROS (+)   Anesthesia Other Findings Past Medical History: No date: Depression No date: Diabetes mellitus without complication (HCC) No date: Dyspnea No date: Dysrhythmia No date: Elevated lipids No date: Fatty liver No date: Hypertension No date: Hypothyroidism No date: Sleep apnea No date: Tremors of nervous system  Past Surgical History: No date: BACK SURGERY 11/16/2016: PACEMAKER INSERTION; N/A     Comment:  Procedure: INSERTION PACEMAKER;  Surgeon: Marcina Millard, MD;  Location: [facility] ORS;  Service:               Cardiovascular;  Laterality: N/A; No date: TEAR DUCT PROBING No date: total knee; Bilateral  BMI    Body Mass Index: 28.87 kg/m      Reproductive/Obstetrics negative OB ROS                             Anesthesia Physical Anesthesia Plan  ASA: 3  Anesthesia Plan: General   Post-op Pain Management:    Induction: Intravenous  PONV Risk Score and Plan: Propofol infusion and TIVA  Airway Management Planned: Natural Airway and Nasal Cannula  Additional Equipment:   Intra-op Plan:   Post-operative Plan:   Informed Consent: I have  reviewed the patients History and Physical, chart, labs and discussed the procedure including the risks, benefits and alternatives for the proposed anesthesia with the patient or authorized representative who has indicated his/her understanding and acceptance.     Dental Advisory Given  Plan Discussed with: Anesthesiologist, CRNA and Surgeon  Anesthesia Plan Comments: (Patient consented for risks of anesthesia including but not limited to:  - adverse reactions to medications - risk of airway placement if required - damage to eyes, teeth, lips or other oral mucosa - nerve damage due to positioning  - sore throat or hoarseness - Damage to heart, brain, nerves, lungs, other parts of body or loss of life  Patient voiced understanding.)        Anesthesia Quick Evaluation

## 2020-11-17 NOTE — Anesthesia Postprocedure Evaluation (Signed)
Anesthesia Post Note  Patient: Chelsea Ruiz  Procedure(s) Performed: FLEXIBLE SIGMOIDOSCOPY  Patient location during evaluation: Endoscopy Anesthesia Type: General Level of consciousness: awake and alert Pain management: pain level controlled Vital Signs Assessment: post-procedure vital signs reviewed and stable Respiratory status: spontaneous breathing, nonlabored ventilation, respiratory function stable and patient connected to nasal cannula oxygen Cardiovascular status: blood pressure returned to baseline and stable Postop Assessment: no apparent nausea or vomiting Anesthetic complications: no   No notable events documented.   Last Vitals:  Vitals:   11/17/20 1137 11/17/20 1147  BP: (!) 159/71 (!) 149/81  Pulse: 73 74  Resp: 10 14  Temp:    SpO2: 93% 93%    Last Pain:  Vitals:   11/17/20 1137  TempSrc:   PainSc: Asleep                 Johny Blamer

## 2020-11-17 NOTE — Progress Notes (Signed)
PT Cancellation Note  Patient Details Name: Chelsea Ruiz MRN: [id number] DOB: 04-29-1940   Cancelled Treatment:    Reason Eval/Treat Not Completed: Patient not medically ready PT order received, chart reviewed. Due to patient's significant altered mental status will hold on PT at this time, and re-attempt at a later time/date as available and patient medically appropriate for PT eval.  Subjective report from daughter:  -Granddaughter was able to get patient to report she is in no pain -Patient is unaware of place, time, and date -Granddaughter reports patient is left handed -States patient is always wheelchair bound, is able to perform a transfer from [name] to commode with assistance -Reports patient falls all the time, however believes last fall she was hospitalized for was in May -Goal is for patient to report to SNF [name])   Angelica Ran, PT  11/17/20. 3:30 PM

## 2020-11-17 NOTE — Op Note (Signed)
[facility] Gastroenterology Patient Name: Chelsea Ruiz Procedure Date: 11/17/2020 10:56 AM MRN: [id number] Account #: [id number] Date of Birth: 02-20-1940 Admit Type: Outpatient Age: 80 Room: [facility] ENDO ROOM 1 Gender: Female Note Status: Finalized Instrument Name: Altamese Cabal Endoscope [id number] Procedure:             Flexible Sigmoidoscopy Indications:           Abnormal CT of the GI tract Providers:             Andrey Farmer MD, MD Medicines:             Monitored Anesthesia Care Complications:         No immediate complications. Estimated blood loss:                         Minimal. Procedure:             Pre-Anesthesia Assessment:                        - Prior to the procedure, a History and Physical was                         performed, and patient medications and allergies were                         reviewed. The patient is unable to give consent                         secondary to the patient's altered mental status. The                         risks and benefits of the procedure and the sedation                         options and risks were discussed with the patient's                         daughter. All questions were answered and informed                         consent was obtained. Patient identification and                         proposed procedure were verified by the physician, the                         anesthesiologist, the anesthetist and the technician                         in the endoscopy suite. Mental Status Examination:                         alert and oriented. Airway Examination: normal                         oropharyngeal airway and neck mobility. Respiratory                         Examination: clear to auscultation. CV Examination:  normal. Prophylactic Antibiotics: The patient does not                         require prophylactic antibiotics. Prior                         Anticoagulants: The patient  has taken no previous                         anticoagulant or antiplatelet agents. ASA Grade                         Assessment: III - A patient with severe systemic                         disease. After reviewing the risks and benefits, the                         patient was deemed in satisfactory condition to                         undergo the procedure. The anesthesia plan was to use                         monitored anesthesia care (MAC). Immediately prior to                         administration of medications, the patient was                         re-assessed for adequacy to receive sedatives. The                         heart rate, respiratory rate, oxygen saturations,                         blood pressure, adequacy of pulmonary ventilation, and                         response to care were monitored throughout the                         procedure. The physical status of the patient was                         re-assessed after the procedure.                        After obtaining informed consent, the scope was passed                         under direct vision. The Endoscope was introduced                         through the anus and advanced to the the sigmoid                         colon. The flexible sigmoidoscopy was accomplished  without difficulty. The patient tolerated the                         procedure well. The quality of the bowel preparation                         was adequate. Findings:      The perianal and digital rectal examinations were normal.      A continuous area of nonbleeding ulcerated mucosa with no stigmata of       recent bleeding was present in the recto-sigmoid colon. Biopsies were       taken with a cold forceps for histology. Estimated blood loss was       minimal. Impression:            - Mucosal ulceration. Biopsied. Recommendation:        - Await pathology results.                        - Return patient to  hospital ward for ongoing care. Procedure Code(s):     --- Professional ---                        [phone], Sigmoidoscopy, flexible; with biopsy, single or                         multiple Diagnosis Code(s):     --- Professional ---                        K63.3, Ulcer of intestine                        R93.3, Abnormal findings on diagnostic imaging of                         other parts of digestive tract CPT copyright 2019 [employer]. All rights reserved. The codes documented in this report are preliminary and upon coder review may  be revised to meet current compliance requirements. Andrey Farmer MD, MD 11/17/2020 11:18:31 AM Number of Addenda: 0 Note Initiated On: 11/17/2020 10:56 AM Scope Withdrawal Time: 0 hours 0 minutes 37 seconds  Total Procedure Duration: 0 hours 7 minutes 12 seconds  Estimated Blood Loss:  Estimated blood loss was minimal.      [facility]

## 2020-11-17 NOTE — Evaluation (Signed)
Occupational Therapy Evaluation Patient Details Name: Chelsea Ruiz MRN: [id number] DOB: 24-May-1940 Today's Date: 11/17/2020   History of Present Illness Chelsea Ruiz is a 80 y.o.  female who resides at [facility]. PMH significant for dementia, hypertension, diabetes, arthritis, pacemaker follows up with Dr. Saralyn Pilar, CKD stage IIIb presented in ED with complaints of altered mental status , being found lethargic and hypotensive. Pt initially presented to ED on 11/13/20 s/p fall and was DC'd back to SNF same day. She returned on 11/14/20 with above complaints.   Clinical Impression   Chelsea Ruiz was seen for OT evaluation this date. Pt presents from memory care with baseline memory/cognitive impairments. Supportive granddaughter at bedside reports pt generally requires significant assistant for ADL/IADL management. She uses a WC (with assist) for functional mobility, and requires near total assistance with all aspects of BADL management including assistance with self-feeding as pt is unable to grasp/hold utensils. Per granddaughter per has attempted to use adapted utensils with little to no success. Pt presents at baseline level of functional independence. Per chart/granddaughter pt to return to memory care unit once medically stable for DC. No skilled OT needs identified. Will sign off at this time. Please re-consult if additional OT needs arise during this hospital stay.      Recommendations for follow up therapy are one component of a multi-disciplinary discharge planning process, led by the attending physician.  Recommendations may be updated based on patient status, additional functional criteria and insurance authorization.   Follow Up Recommendations  Long-term institutional care without follow-up therapy (Return to memory care unit.)    Assistance Recommended at Discharge Frequent or constant Supervision/Assistance  Functional Status Assessment  Patient has not had a  recent decline in their functional status  Equipment Recommendations  None recommended by OT    Recommendations for Other Services       Precautions / Restrictions Precautions Precautions: Fall Precaution Comments: High Fall Restrictions Weight Bearing Restrictions: No      Mobility Bed Mobility Overal bed mobility: Needs Assistance Bed Mobility: Rolling Rolling: Mod assist         General bed mobility comments: Significant difficluty with sequencing bed mobility on command however, pt is noted to independently adjust position in bed when she desires.    Transfers                          Balance Overall balance assessment: Needs assistance                                         ADL either performed or assessed with clinical judgement   ADL Overall ADL's : At baseline                                       General ADL Comments: Pt requires significant assist for ADL management at baseline icluding asssistance for self-feeding, grooming, bathing, dressing, toileting, and functional mobility. Per granddaughter at bedside, pt at baselne level of functional independence.     Vision Patient Visual Report: No change from baseline       Perception     Praxis      Pertinent Vitals/Pain Faces Pain Scale: No hurt     Hand Dominance     Extremity/Trunk  Assessment Upper Extremity Assessment Upper Extremity Assessment: Generalized weakness;Difficult to assess due to impaired cognition   Lower Extremity Assessment Lower Extremity Assessment: Difficult to assess due to impaired cognition       Communication     Cognition Arousal/Alertness: Awake/alert Behavior During Therapy: Restless;Agitated Overall Cognitive Status: History of cognitive impairments - at baseline                                 General Comments: Pt with significant baseline cognitive impairments. Mildly restless/agitated during  session,unable to recognize family at bedside, regularly asking therapist to "take these to the car and go, I'll see you tomorrow". Granddaughter at bedside states she is at her baseline level of cognition.     General Comments       Exercises Other Exercises Other Exercises: Pt education/treatment limited by cognition. Granddaughter at bedside educated on role of OT in acute setting   Shoulder Instructions      Home Living Family/patient expects to be discharged to:: Skilled nursing facility (Memory care)                             Home Equipment: Wheelchair - manual   Additional Comments: Pt from memory care unit. Has 24/7 assist from staff for all ADL/IADL management. Uses WC for functional mobility. Per granddaughter at bedside, is able to stand pivot from [facility] to commode or bed with assistance from staff.      Prior Functioning/Environment Prior Level of Function : Needs assist  Cognitive Assist : Mobility (cognitive);ADLs (cognitive) Mobility (Cognitive): Step by step cues ADLs (Cognitive): Step by step cues Physical Assist : Mobility (physical);ADLs (physical) Mobility (physical): Bed mobility;Transfers ADLs (physical): Feeding;Grooming;Bathing;Dressing;Toileting;IADLs   ADLs Comments: Pt from memory care unit. Has 24/7 assist from staff for all ADL/IADL management. Uses WC for functional mobility. Per granddaughter at bedside, is able to stand pivot from [facility] to commode or bed with assistance from staff.        OT Problem List: Decreased cognition;Decreased safety awareness      OT Treatment/Interventions:      OT Goals(Current goals can be found in the care plan section) Acute Rehab OT Goals Patient Stated Goal: To return to memory care facility OT Goal Formulation: All assessment and education complete, DC therapy Time For Goal Achievement: 11/17/20 Potential to Achieve Goals: Good  OT Frequency:     Barriers to D/C:            Co-evaluation               AM-PAC OT "6 Clicks" Daily Activity     Outcome Measure Help from another person eating meals?: A Lot Help from another person taking care of personal grooming?: A Lot Help from another person toileting, which includes using toliet, bedpan, or urinal?: A Lot Help from another person bathing (including washing, rinsing, drying)?: A Lot Help from another person to put on and taking off regular upper body clothing?: A Lot Help from another person to put on and taking off regular lower body clothing?: A Lot 6 Click Score: 12   End of Session    Activity Tolerance: Other (comment);Patient tolerated treatment well (limited by cognition) Patient left: with call bell/phone within reach;with bed alarm set  OT Visit Diagnosis: Other abnormalities of gait and mobility (R26.89);Other symptoms and signs involving cognitive function  Time: 1275-1700 OT Time Calculation (min): 15 min Charges:  OT General Charges $OT Visit: 1 Visit OT Evaluation $OT Eval Moderate Complexity: 1 Mod  Rockney Ghee, M.S., OTR/L Feeding Team - [facility] Ascom: [phone] 11/17/20, 5:05 PM

## 2020-11-17 NOTE — Progress Notes (Signed)
*  PRELIMINARY RESULTS* Echocardiogram 2D Echocardiogram has been performed.  Chelsea Ruiz Chelsea Ruiz 11/17/2020, 8:24 AM

## 2020-11-17 NOTE — Anesthesia Procedure Notes (Signed)
Procedure Name: MAC Date/Time: 11/17/2020 10:59 AM Performed by: Biagio Borg, CRNA Pre-anesthesia Checklist: Patient identified, Emergency Drugs available, Suction available, Patient being monitored and Timeout performed Patient Re-evaluated:Patient Re-evaluated prior to induction Oxygen Delivery Method: Nasal cannula Induction Type: IV induction Placement Confirmation: positive ETCO2 and CO2 detector

## 2020-11-17 NOTE — Progress Notes (Signed)
Speech Language Pathology Treatment: Dysphagia  Patient Details Name: Chelsea Ruiz MRN: [id number] DOB: 04-Jun-1940 Today's Date: 11/17/2020 Time: 1410-1450 SLP Time Calculation (min) (ACUTE ONLY): 40 min  Assessment / Plan / Recommendation Clinical Impression  Pt seen for ongoing assessment of swallowing. She has baseline Dementia and resides at a Memory Care facility. Pt reported they give feeding assistance and have been doing so for some time d/t weak UEs(hands) to self-feeding per Granddaughter. She is alert, verbally responsive and engaged in conversation w/ SLP and Granddaughter though mostly tangential. Pt is on RA; native Dentition. She did help to hold Cup to drink w/ SLP. Pt/family explained general aspiration precautions and agreed verbally to the need for following them especially sitting upright for all oral intake -- supported behind the back for full upright sitting. Pt assisted w/ feeding and seemed hungry pointing at the next food bite she wanted. She consumed MINCED solids, purees, and thin liquids via straw. NO overt clinical s/s of aspiration were noted w/ any consistency; respiratory status remained calm and unlabored, vocal quality clear b/t trials. Oral phase appeared grossly [facility] for bolus management and timely A-P transfer for swallowing; min increased mastication time w/ MINCED foods, but adequate. Suspect impact from Cognitive decline. Oral clearing achieved w/ all consistencies -- encouraged alternating foods/liquids during the meal.   Education given on general oral phase dysphagia in light of Cognitive decline/Dementia; foods consistencies and prep; the Dys. MINCED foods diet level; and general aspiration precautions to the Granddaughter present; handouts. She agreed w/ continued use of this diet consistency. Pt will benefit from a MINCED consistency diet at D/C.   Pt appears at reduced risk for aspriation when following general aspiration precautions and using a  modified diet. Recommend continue a Dysphagia level 2 (MINCED foods) for ease w/ gravies added to moisten foods; Thin liquids. Recommend general aspiration precautions; Pills CRUSHED in Puree; tray setup and positioning assistance for meals. Supervision and support w/ feeding at meals as needed -- Reduce distractions at mealtimes.  ST services will sign off at this time w/ MD to reconsult if any new needs while admitted. NSG updated. Precautions posted at bedside.     HPI HPI: Per 66 H&P "Chelsea Ruiz is a 80 y.o. female seen in ed with complaints of AMS, lethargic , hypertension. Pt was seen yesterday for fall eval and d/c. Today pt is lethargic and sleepy and nonverbal , diarrhea that started today, noted to have red blood in it.HPI is o/w unobtainable due to dementia. Pt is unresponsive with gcs of 3. Daughter bedside- ms karen. Pt also has colon cancer history in mom. Last scope was two years ago. "      SLP Plan  All goals met      Recommendations for follow up therapy are one component of a multi-disciplinary discharge planning process, led by the attending physician.  Recommendations may be updated based on patient status, additional functional criteria and insurance authorization.    Recommendations  Diet recommendations: Dysphagia 2 (fine chop);Thin liquid Liquids provided via: Cup;Straw (monitor) Medication Administration: Crushed with puree (for safer, easier swallowing) Supervision: Staff to assist with self feeding;Full supervision/cueing for compensatory strategies Compensations: Minimize environmental distractions;Slow rate;Small sips/bites;Lingual sweep for clearance of pocketing;Follow solids with liquid Postural Changes and/or Swallow Maneuvers: Out of bed for meals;Seated upright 90 degrees;Upright 30-60 min after meal                General recommendations:  (Dietician f/u) Oral Care Recommendations:  Oral care BID;Oral care before and after  PO;Staff/trained caregiver to provide oral care Follow Up Recommendations: No SLP follow up Assistance recommended at discharge: Frequent or constant Supervision/Assistance SLP Visit Diagnosis: Dysphagia, oral phase (R13.11) (Baseline Dementia) Plan: All goals met       GO                 Orinda Kenner, MS, CCC-SLP Speech Language Pathologist Rehab Services [phone] [facility]  11/17/2020, 6:11 PM

## 2020-11-17 NOTE — TOC Initial Note (Signed)
Transition of Care [facility]) - Initial/Assessment Note    Patient Details  Name: Chelsea Ruiz MRN: [id number] Date of Birth: [birth year]  [facility] CM/SW Contact:    Chapman Fitch, RN Phone Number: 11/17/2020, 2:39 PM  Clinical Narrative:                   Patient admitted for AMS Patient lives at [facility] Daughter Clydie Braun confirms plan is for patient to return at discharge  Per Harriett Sine at [facility] at baseline patient can stand pivot and transfer to [facility] with assistance. PT OT eval pending   Expected Discharge Plan: Memory Care Barriers to Discharge: Continued Medical Work up   Patient Goals and CMS Choice        Expected Discharge Plan and Services Expected Discharge Plan: Memory Care   Discharge Planning Services: CM Consult   Living arrangements for the past 2 months:  (memory care)                                      Prior Living Arrangements/Services Living arrangements for the past 2 months:  (memory care) Lives with:: Facility Resident Patient language and need for interpreter reviewed:: Yes Do you feel safe going back to the place where you live?: Yes      Need for Family Participation in Patient Care: Yes (Comment) Care giver support system in place?: Yes (comment)   Criminal Activity/Legal Involvement Pertinent to Current Situation/Hospitalization: No - Comment as needed  Activities of Daily Living      Permission Sought/Granted                  Emotional Assessment         Alcohol / Substance Use: Not Applicable Psych Involvement: No (comment)  Admission diagnosis:  Altered mental status, unspecified altered mental status type [R41.82] Gastrointestinal hemorrhage, unspecified gastrointestinal hemorrhage type [K92.2] AMS (altered mental status) [R41.82] Patient Active Problem List   Diagnosis Date Noted   AMS (altered mental status) 11/14/2020   GI bleed 11/14/2020   Hypomagnesemia  03/06/2018   Peripheral neuropathy 03/06/2018   Degenerative arthritis of lumbar spine 02/20/2018   Chronic bilateral low back pain without sciatica (Primary Area of Pain) (L>R) 02/20/2018   Pain in both hands (Secondary Area of Pain) (L>R) 02/20/2018   Chronic neck pain (Tertiary Area of Pain) 02/20/2018   Chronic pain syndrome 02/20/2018   Long term current use of opiate analgesic 02/20/2018   Pharmacologic therapy 02/20/2018   Disorder of skeletal system 02/20/2018   Problems influencing health status 02/20/2018   Memory loss 12/12/2017   Pedal edema 01/03/2017   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/27/2016   Bradycardia, sinus 11/07/2016   Lightheadedness 10/23/2016   S/P total knee replacement, 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/2016   Atrioventricular block, second degree 06/12/2016   Nonrheumatic aortic valve stenosis 06/12/2016   Chronic pain of right knee (Fourth Area of Pain) 06/05/2016   Abnormal ECG 05/31/2016   SOB (shortness of breath) on exertion 05/31/2016   Sleep apnea 12/10/2015   Wears hearing aid 12/10/2015   Malfunction of spinal cord stimulator (HCC) 11/23/2015   Chronic pain of multiple joints 11/11/2015   Essential hypertension 01/27/2015   Fatty (change of) liver, not elsewhere classified 01/27/2015   Hyperlipidemia 01/27/2015   Thyroid disease 01/27/2015   Type 2 diabetes mellitus with stage 3 chronic kidney disease,  without long-term current use of insulin ([place]) 01/27/2015   Difficulty walking 11/26/2013   Foot drop, left foot 11/26/2013   Unspecified abnormalities of gait and mobility 11/26/2013   Other closed fractures of distal end of radius (alone) 10/15/2013   Depression 01/31/2012   Biomechanical lesion, unspecified 09/19/2011   Other secondary scoliosis, lumbar region 08/30/2011   Spinal stenosis, lumbar region without neurogenic claudication 08/30/2011   Chronic kidney disease (CKD), stage III (moderate) ([place]) 04/25/2011   Chronic venous insufficiency  03/13/2011   Mitral valve prolapse 03/13/2011   Fibromyalgia 02/08/2011   Other cervical disc degeneration, unspecified cervical region 02/08/2011   Arthropathic psoriasis, 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

## 2020-11-17 NOTE — Progress Notes (Signed)
PROGRESS NOTE    Chelsea Ruiz  TGG:[id number] DOB: 12-15-1940 DOA: 11/14/2020 PCP: Orvis Brill, [facility]   Brief Narrative:  80 year old with history of dementia, HTN, DM2, arthritis,, sick sinus syndrome with pacemaker in place, CKD stage IIIb admitted for altered mental status.  Initially found to be lethargic and hypotensive.  Family did report of diarrhea/blood in the stool.  Initially met sepsis criteria concerning for aspiration pneumonia therefore started on IV antibiotics.  EKG was abnormal therefore cardiology team was consulted.  Patient went sigmoidoscopy on 11/16 which showed mucosal ulceration which was biopsied.   Assessment & Plan:   Principal Problem:   AMS (altered mental status) Active Problems:   Chronic kidney disease (CKD), stage III (moderate) (HCC)   Essential hypertension   Memory loss   Type 2 diabetes mellitus with stage 3 chronic kidney disease, without long-term current use of insulin (HCC)   Hypomagnesemia   GI bleed   Acute metabolic encephalopathy, improving slowly - Etiology is exactly unclear.  CT head is negative, pacemaker is not compatible with MRI.  Previous provider discussed case with neurology who recommended repeating CT head and 24-48 hours which was negative - UA-negative -Ammonia-normal TSH elevated but T4 was normal -Currently on empiric IV antibiotics for possible aspiration pneumonia  Sepsis secondary to right upper lobe aspiration pneumonia, POA - Sepsis physiology slowly appears to be improving.  On empiric IV Zosyn.  Follow-up culture data.  As needed bronchodilators/I-S/flutter valve  Proctosigmoiditis with mucosal ulceration -GI panel and C. difficile is negative.  Proctosigmoiditis seen on CTA of the abdomen.  Flex sig performed by GI 11/16-showing mucosal ulceration.  Elevated D-dimer - CTA negative for PE, showed right upper lobe pneumonia  Hypothyroidism - Daily Synthroid  History of dementia - Supportive  care  Essential hypertension - On metoprolol.  IV hydralazine as needed  Diabetes mellitus type 2 - Home regimen on hold.  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  Maintain hospital stay.     Subjective: Appears quite chronically ill.  Answers basic question, alert to herself today.  She is returning from her procedure and does not appear to be any distress.  Review of Systems Otherwise negative except as per HPI, including: General: Denies fever, chills, night sweats or unintended weight loss. Resp: Denies cough, wheezing, shortness of breath. Cardiac: Denies chest pain, palpitations, orthopnea, paroxysmal nocturnal dyspnea. GI: Denies abdominal pain, nausea, vomiting, diarrhea or constipation GU: Denies dysuria, frequency, hesitancy or incontinence MS: Denies muscle aches, joint pain or swelling Neuro: Denies headache, neurologic deficits (focal weakness, numbness, tingling), abnormal gait Psych: Denies anxiety, depression, SI/HI/AVH Skin: Denies new rashes or lesions ID: Denies sick contacts, exotic exposures, travel  Examination:  General exam: Overall appears malnourished and generally weak Respiratory system: Clear to auscultation. Respiratory effort normal. Cardiovascular system: S1 & S2 heard, RRR. No JVD, murmurs, rubs, gallops or clicks. No pedal edema. Gastrointestinal system: Abdomen is nondistended, soft and nontender. No organomegaly or masses felt. Normal bowel sounds heard. Central nervous system: Alert awake oriented to self only. Extremities: Symmetric 5 x 5 power. Skin: No rashes,  lesions or ulcers Psychiatry: Poor judgment and insight.    Objective:  Vitals:   11/16/20 1625 11/16/20 2109 11/17/20 0611 11/17/20 0746  BP: (!) 115/58 133/89 (!) 157/76 (!) 144/72  Pulse: 84 80 78 73  Resp:  14 18   Temp: 97.9 F (36.6 C) 99.2 F (37.3 C) 98.5 F (36.9 C) 97.9 F (36.6 C)  TempSrc:  Oral Axillary   SpO2: 95% 97% 95% 95%  Weight:        Intake/Output Summary (Last 24 hours) at 11/17/2020 0814 Last data filed at 11/17/2020 0316 Gross per 24 hour  Intake 540 ml  Output --  Net 540 ml   Filed Weights   11/14/20 1705  Weight: 71.6 kg     Data Reviewed:   CBC: Recent Labs  Lab 11/13/20 2011 11/14/20 1811 11/15/20 0611 11/16/20 0446 11/17/20 0526  WBC 8.0 16.6* 16.1* 14.7* 12.3*  NEUTROABS  --  14.9*  --   --   --   HGB 11.9* 14.4 14.6 12.2 11.1*  HCT 36.8 44.9 44.1 36.4 34.2*  MCV 95.1 93.9 92.8 93.3 91.7  PLT 251 223 250 219 170   Basic Metabolic Panel: Recent Labs  Lab 11/13/20 2011 11/14/20 1811 11/15/20 0611 11/16/20 0446 11/17/20 0526  NA 141 137 139 139 139  K 3.8 4.3 4.1 3.6 3.4*  CL 105 100 102 104 103  CO2 $Re'30 28 26 27 27  'lzU$ GLUCOSE 181* 200* 162* 131* 122*  BUN 29* 27* 25* 31* 28*  CREATININE 1.21* 1.19* 1.16* 1.47* 1.23*  CALCIUM 9.0 9.3 8.9 8.6* 8.6*  MG  --   --   --  1.7 1.6*  PHOS  --   --   --  3.8 2.5   GFR: Estimated Creatinine Clearance: 34.9 mL/min (A) (by C-G formula based on SCr of 1.23 mg/dL (H)). Liver Function Tests: Recent Labs  Lab 11/14/20 1811 11/17/20 0526  AST 26 19  ALT 20 14  ALKPHOS 95 70  BILITOT 1.0 0.7  PROT 7.1 6.4*  ALBUMIN 3.6 2.9*   No results for input(s): LIPASE, AMYLASE in the last 168 hours. Recent Labs  Lab 11/14/20 2255  AMMONIA 14   Coagulation Profile: No results for input(s): INR, PROTIME in the last 168 hours. Cardiac Enzymes: Recent Labs  Lab 11/15/20 0330  CKTOTAL 54   BNP (last 3 results) No results for input(s): PROBNP in the last 8760  hours. HbA1C: Recent Labs    11/14/20 2255  HGBA1C 6.8*   CBG: Recent Labs  Lab 11/16/20 1622 11/16/20 2105 11/16/20 2326 11/17/20 0646 11/17/20 0746  GLUCAP 139* 126* 145* 140* 125*   Lipid Profile: No results for input(s): CHOL, HDL, LDLCALC, TRIG, CHOLHDL, LDLDIRECT in the last 72 hours. Thyroid Function Tests: Recent Labs    11/14/20 2255  TSH 4.648*  FREET4 0.81   Anemia Panel: No results for input(s): VITAMINB12, FOLATE, FERRITIN, TIBC, IRON, RETICCTPCT in the last 72 hours. Sepsis Labs: Recent Labs  Lab 11/15/20 0036 11/15/20 0611 11/15/20 1422 11/16/20 0818  LATICACIDVEN 3.4* 2.1* 2.3* 1.2    Recent Results (from the past 240 hour(s))  CULTURE, BLOOD (ROUTINE X 2) w Reflex to ID Panel     Status: None (Preliminary result)   Collection Time: 11/14/20 10:55 PM   Specimen: BLOOD  Result Value Ref Range Status   Specimen Description BLOOD RA  Final   Special Requests   Final    BOTTLES DRAWN AEROBIC AND ANAEROBIC Blood Culture adequate volume   Culture   Final    NO GROWTH 3 DAYS  Performed at [facility], [address] [place], [place] [address]    Report Status PENDING  Incomplete  Urine Culture     Status: Abnormal   Collection Time: 11/15/20 12:36 AM   Specimen: In/Out Cath Urine  Result Value Ref Range Status   Specimen Description   Final    IN/OUT CATH URINE Performed at [facility], [address] [place], [place] [address]    Special Requests   Final    NONE Performed at [facility], [address] [place], [place] [address]    Culture MULTIPLE SPECIES PRESENT, SUGGEST RECOLLECTION (A)  Final   Report Status 11/16/2020 FINAL  Final  Gastrointestinal Panel by PCR , Stool     Status: None   Collection Time: 11/15/20  6:36 AM   Specimen: Nasal Mucosa; Stool  Result Value Ref Range Status   Campylobacter species NOT DETECTED NOT DETECTED Final   Plesimonas shigelloides NOT DETECTED NOT DETECTED Final    Salmonella species NOT DETECTED NOT DETECTED Final   Yersinia enterocolitica NOT DETECTED NOT DETECTED Final   Vibrio species NOT DETECTED NOT DETECTED Final   Vibrio cholerae NOT DETECTED NOT DETECTED Final   Enteroaggregative E coli (EAEC) NOT DETECTED NOT DETECTED Final   Enteropathogenic E coli (EPEC) NOT DETECTED NOT DETECTED Final   Enterotoxigenic E coli (ETEC) NOT DETECTED NOT DETECTED Final   Shiga like toxin producing E coli (STEC) NOT DETECTED NOT DETECTED Final   Shigella/Enteroinvasive E coli (EIEC) NOT DETECTED NOT DETECTED Final   Cryptosporidium NOT DETECTED NOT DETECTED Final   Cyclospora cayetanensis NOT DETECTED NOT DETECTED Final   Entamoeba histolytica NOT DETECTED NOT DETECTED Final   Giardia lamblia NOT DETECTED NOT DETECTED Final   Adenovirus F40/41 NOT DETECTED NOT DETECTED Final   Astrovirus NOT DETECTED NOT DETECTED Final   Norovirus GI/GII NOT DETECTED NOT DETECTED Final   Rotavirus A NOT DETECTED NOT DETECTED Final   Sapovirus (I, II, IV, and V) NOT DETECTED NOT DETECTED Final    Comment: Performed at [facility], [address] [place], [place] [address]  MRSA Next Gen by PCR, Nasal     Status: None   Collection Time: 11/15/20  6:36 AM   Specimen: Nasal Mucosa; Nasal Swab  Result Value Ref Range Status   MRSA by PCR Next Gen NOT DETECTED NOT DETECTED Final    Comment: (NOTE) The GeneXpert MRSA Assay (FDA approved for NASAL specimens only), is one component of a comprehensive MRSA colonization surveillance program. It is not intended to diagnose MRSA infection nor to guide or monitor treatment for MRSA infections. Test performance is not FDA approved in patients less than 1 years old. Performed at [facility], [address] [place], [place] [address]   C Difficile Quick Screen w PCR reflex     Status: None   Collection Time: 11/15/20  2:00 PM   Specimen: STOOL  Result Value Ref Range Status   C Diff antigen NEGATIVE NEGATIVE  Final   C Diff toxin NEGATIVE NEGATIVE Final   C Diff interpretation No C. difficile detected.  Final    Comment: Performed at [facility], [address] [place], [place] [address]  Culture, blood (Routine X 2) w Reflex to ID Panel     Status: None (Preliminary result)   Collection Time: 11/15/20  2:22 PM   Specimen: BLOOD  Result Value Ref Range Status   Specimen Description BLOOD BLOOD LEFT HAND  Final  Special Requests   Final    BOTTLES DRAWN AEROBIC AND ANAEROBIC Blood Culture results may not be optimal due to an inadequate volume of blood received in culture bottles   Culture   Final    NO GROWTH 2 DAYS Performed at [facility], [address], [place], [place] [address]    Report Status PENDING  Incomplete         Radiology Studies: CT HEAD WO CONTRAST (5MM)  Result Date: 11/16/2020 CLINICAL DATA:  Altered mental status EXAM: CT HEAD WITHOUT CONTRAST TECHNIQUE: Contiguous axial images were obtained from the base of the skull through the vertex without intravenous contrast. COMPARISON:  11/14/2020 FINDINGS: Brain: There is no evidence of recent bleeding within the cranium. There is no focal mass effect. There is no shift of midline structures. Cortical sulci are prominent. There is decreased density in the subcortical and periventricular white matter. Vascular: There are scattered arterial calcifications. Skull: No fracture is seen in the calvarium. There is interval decrease in the left periorbital contusion/hematoma in the scalp. Sinuses/Orbits: There is possible 1.4 x 0.7 cm polyp in the anterior left nasal cavity. Other: None IMPRESSION: No acute intracranial findings are seen. There are no signs of bleeding. Atrophy. Small-vessel disease. There is interval decrease in subcutaneous contusion/hematoma in the left periorbital region and left frontal scalp. There is possible polyp in the anterior left nasal cavity. Electronically Signed   By: Elmer Picker M.D.   On: 11/16/2020 12:06        Scheduled Meds:  chlorhexidine  15 mL Mouth Rinse BID   Chlorhexidine Gluconate Cloth  6 each Topical Daily   insulin aspart  0-9 Units Subcutaneous Q4H   levothyroxine  50 mcg Intravenous Daily   mouth rinse  15 mL Mouth Rinse q12n4p   metoprolol tartrate  12.5 mg Oral BID   pantoprazole (PROTONIX) IV  40 mg Intravenous Daily   sodium chloride flush  3 mL Intravenous Q12H   Continuous Infusions:  ampicillin-sulbactam (UNASYN) IV 3 g (11/17/20 0650)     LOS: 3 days   Time spent= 35 mins    Raphaela Cannaday Arsenio Loader, MD [facility]  If 7PM-7AM, please contact night-coverage  11/17/2020, 8:14 AM

## 2020-11-18 ENCOUNTER — Encounter: Payer: Self-pay | Admitting: Gastroenterology

## 2020-11-18 DIAGNOSIS — I10 Essential (primary) hypertension: Secondary | ICD-10-CM | POA: Diagnosis not present

## 2020-11-18 DIAGNOSIS — R4182 Altered mental status, unspecified: Secondary | ICD-10-CM | POA: Diagnosis not present

## 2020-11-18 DIAGNOSIS — K922 Gastrointestinal hemorrhage, unspecified: Secondary | ICD-10-CM | POA: Diagnosis not present

## 2020-11-18 LAB — GLUCOSE, CAPILLARY
Glucose-Capillary: 127 mg/dL — ABNORMAL HIGH (ref 70–99)
Glucose-Capillary: 142 mg/dL — ABNORMAL HIGH (ref 70–99)
Glucose-Capillary: 151 mg/dL — ABNORMAL HIGH (ref 70–99)
Glucose-Capillary: 161 mg/dL — ABNORMAL HIGH (ref 70–99)
Glucose-Capillary: 170 mg/dL — ABNORMAL HIGH (ref 70–99)
Glucose-Capillary: 172 mg/dL — ABNORMAL HIGH (ref 70–99)

## 2020-11-18 LAB — BASIC METABOLIC PANEL
Anion gap: 14 (ref 5–15)
BUN: 17 mg/dL (ref 8–23)
CO2: 26 mmol/L (ref 22–32)
Calcium: 8.5 mg/dL — ABNORMAL LOW (ref 8.9–10.3)
Chloride: 99 mmol/L (ref 98–111)
Creatinine, Ser: 1.06 mg/dL — ABNORMAL HIGH (ref 0.44–1.00)
GFR, Estimated: 54 mL/min — ABNORMAL LOW (ref >60)
Glucose, Bld: 141 mg/dL — ABNORMAL HIGH (ref 70–99)
Potassium: 3.1 mmol/L — ABNORMAL LOW (ref 3.5–5.1)
Sodium: 139 mmol/L (ref 135–145)

## 2020-11-18 LAB — MAGNESIUM: Magnesium: 1.4 mg/dL — ABNORMAL LOW (ref 1.7–2.4)

## 2020-11-18 LAB — SURGICAL PATHOLOGY

## 2020-11-18 MED ORDER — MAGNESIUM SULFATE 4 GM/100ML IV SOLN
4.0000 g | Freq: Once | INTRAVENOUS | Status: AC
  Administered 2020-11-18: 14:00:00 4 g via INTRAVENOUS
  Filled 2020-11-18: qty 100

## 2020-11-18 MED ORDER — HALOPERIDOL LACTATE 5 MG/ML IJ SOLN
1.0000 mg | Freq: Four times a day (QID) | INTRAMUSCULAR | Status: AC | PRN
  Filled 2020-11-18: qty 1

## 2020-11-18 MED ORDER — POTASSIUM CHLORIDE 10 MEQ/100ML IV SOLN
10.0000 meq | INTRAVENOUS | Status: AC
  Administered 2020-11-18 (×4): 10 meq via INTRAVENOUS
  Filled 2020-11-18 (×5): qty 100

## 2020-11-18 MED ORDER — GLUCERNA SHAKE PO LIQD
237.0000 mL | Freq: Three times a day (TID) | ORAL | Status: DC
  Administered 2020-11-18 – 2020-11-19 (×4): 237 mL via ORAL

## 2020-11-18 NOTE — TOC Progression Note (Signed)
Transition of Care [facility]) - Progression Note    Patient Details  Name: Chelsea Ruiz MRN: [id number] Date of Birth: [birth year]  Transition of Care [facility]) CM/SW Contact  Margarito Liner, LCSW Phone Number: 11/18/2020, 2:10 PM  Clinical Narrative: Called daughter, Tresa Endo. Discussed PT recommendation for SNF. She is agreeable. Preferences are [facility] or [facility]. PASARR under manual review.  Expected Discharge Plan: Memory Care Barriers to Discharge: Continued Medical Work up  Expected Discharge Plan and Services Expected Discharge Plan: Memory Care   Discharge Planning Services: CM Consult   Living arrangements for the past 2 months:  (memory care)                                       Social Determinants of Health (SDOH) Interventions    Readmission Risk Interventions Readmission Risk Prevention Plan 11/17/2020  Transportation Screening Complete  Social Work Consult for Recovery Care Planning/Counseling Complete  Palliative Care Screening Complete  Medication Review Oceanographer) Complete  Some recent data might be hidden

## 2020-11-18 NOTE — TOC CM/SW Note (Signed)
Re: Chelsea Ruiz Date of Birth: [birth year] Date: 11/18/2020   To Whom It May Concern:  Please be advised that the above-name patient's dementia diagnosis is primary and supersedes his mental illness.

## 2020-11-18 NOTE — Evaluation (Signed)
Physical Therapy Evaluation Patient Details Name: Chelsea Ruiz MRN: [id number] DOB: 1940-10-16 Today's Date: 11/18/2020  History of Present Illness  Chelsea Ruiz is a 80 y.o. female who resides at [facility]. PMH significant for dementia, hypertension, diabetes, arthritis, pacemaker follows up with Dr. Darrold Junker, CKD stage IIIb presented in ED with complaints of altered mental status , being found lethargic and hypotensive. Pt initially presented to ED on 11/13/20 s/p fall and was DC'd back to SNF same day. She returned on 11/14/20 with above complaints.  Clinical Impression  Pt received supine in bed, granddaughter in room and pt agreeable to therapy. Pt verbalizes minimally throughout session; PT checks in with pt multiple times and communicates with pt granddaughter. Pt obtained EOB with MOD A to lift trunk; MAX A required to return to bed with VC on direction of movement and sequencing for pt participation. STS and stand pivot performed with MOD and MAX A, respectively. Assist with lifting, steadying, pivot and knee block provided due to early knee buckle upon initial stand. Pt was impulsive with mobility. Granddaughter states pt is below baseline with STS and transfers. Would benefit from skilled PT to address above deficits and promote optimal return to PLOF.        Recommendations for follow up therapy are one component of a multi-disciplinary discharge planning process, led by the attending physician.  Recommendations may be updated based on patient status, additional functional criteria and insurance authorization.  Follow Up Recommendations Skilled nursing-short term rehab (<3 hours/day)    Assistance Recommended at Discharge Frequent or constant Supervision/Assistance  Functional Status Assessment Patient has had a recent decline in their functional status and demonstrates the ability to make significant improvements in function in a reasonable and predictable amount  of time.  Equipment Recommendations  None recommended by PT    Recommendations for Other Services       Precautions / Restrictions Precautions Precautions: Fall Restrictions Weight Bearing Restrictions: No      Mobility  Bed Mobility Overal bed mobility: Needs Assistance Bed Mobility: Rolling;Supine to Sit;Sit to Supine Rolling: Mod assist   Supine to sit: Mod assist Sit to supine: Max assist   General bed mobility comments: Difficulty comprehending instructions. MOD A to manage trunk to sitting; MAX A to manage BLE and trunk to supine.    Transfers Overall transfer level: Needs assistance Equipment used: None Transfers: Bed to chair/wheelchair/BSC;Sit to/from Stand Sit to Stand: Mod assist Stand pivot transfers: Max assist         General transfer comment: MOD A to lift during STS. Pt requires steadying and assist with pivot to stand pivot EOB<>recliner. Pt unable to achieve full knee and hip extension in standing.    Ambulation/Gait               General Gait Details: Pt does not ambulate at baseline.  Stairs            Wheelchair Mobility    Modified Rankin (Stroke Patients Only)       Balance Overall balance assessment: Needs assistance   Sitting balance-Leahy Scale: Fair Sitting balance - Comments: Requires CGA for safety while sitting EOB     Standing balance-Leahy Scale: Poor Standing balance comment: Pt relies on assist of PT to maintain standing position.                             Pertinent Vitals/Pain Pain Assessment: No/denies pain  Home Living Family/patient expects to be discharged to:: Skilled nursing facility (Memory care)                 Home Equipment: Wheelchair - manual Additional Comments: Pt from memory care unit at [facility]. Has 24/7 assist from staff for all ADL/IADL management. Uses WC for functional mobility. Per granddaughter at bedside, is able to stand pivot from [facility] to commode or  bed with assistance from staff.    Prior Function Prior Level of Function : Needs assist  Cognitive Assist : Mobility (cognitive);ADLs (cognitive) Mobility (Cognitive): Step by step cues ADLs (Cognitive): Step by step cues Physical Assist : Mobility (physical);ADLs (physical) Mobility (physical): Bed mobility;Transfers ADLs (physical): Feeding;Grooming;Bathing;Dressing;Toileting;IADLs   ADLs Comments: Pt from memory care unit. Has 24/7 assist from staff for all ADL/IADL management. Uses WC for functional mobility. Per granddaughter at bedside, is able to stand pivot from [facility] to commode or bed with assistance from staff.     Hand Dominance        Extremity/Trunk Assessment   Upper Extremity Assessment Upper Extremity Assessment: Generalized weakness    Lower Extremity Assessment Lower Extremity Assessment: Generalized weakness (at least 3/5 knee extension. Limited ROM in B ankle DF.)    Cervical / Trunk Assessment Cervical / Trunk Assessment: Kyphotic  Communication   Communication: HOH  Cognition Arousal/Alertness: Awake/alert Behavior During Therapy: WFL for tasks assessed/performed;Flat affect Overall Cognitive Status: History of cognitive impairments - at baseline                                 General Comments: Frequent cueing to maintain pt attention to task. Impulsive. Granddaughter at bedside states she is at her baseline level of cognition.        General Comments      Exercises     Assessment/Plan    PT Assessment Patient needs continued PT services  PT Problem List Decreased strength;Decreased mobility;Decreased safety awareness;Decreased range of motion;Decreased activity tolerance;Decreased coordination;Decreased balance       PT Treatment Interventions DME instruction;Therapeutic activities;Therapeutic exercise;Patient/family education;Balance training;Wheelchair mobility training;Functional mobility training;Neuromuscular re-education     PT Goals (Current goals can be found in the Care Plan section)  Acute Rehab PT Goals Patient Stated Goal: to return to SNF/memory care PT Goal Formulation: With family Time For Goal Achievement: 12/02/20 Potential to Achieve Goals: Good    Frequency Min 2X/week   Barriers to discharge        Co-evaluation               AM-PAC PT "6 Clicks" Mobility  Outcome Measure Help needed turning from your back to your side while in a flat bed without using bedrails?: A Little Help needed moving from lying on your back to sitting on the side of a flat bed without using bedrails?: A Lot Help needed moving to and from a bed to a chair (including a wheelchair)?: A Lot Help needed standing up from a chair using your arms (e.g., wheelchair or bedside chair)?: A Lot Help needed to walk in hospital room?: Total Help needed climbing 3-5 steps with a railing? : Total 6 Click Score: 11    End of Session Equipment Utilized During Treatment: Gait belt Activity Tolerance: Patient limited by fatigue;Patient tolerated treatment well Patient left: in bed;with call bell/phone within reach;with bed alarm set;with family/visitor present Nurse Communication: Mobility status PT Visit Diagnosis: Unsteadiness on feet (R26.81);Muscle weakness (generalized) (  M62.81);History of falling (Z91.81)    Time: [id number] PT Time Calculation (min) (ACUTE ONLY): 19 min   Charges:   PT Evaluation $PT Eval Moderate Complexity: 1 Mod PT Treatments $Therapeutic Activity: 8-22 mins        Patrina Levering PT, DPT 11/18/20 12:53 PM [id number]

## 2020-11-18 NOTE — TOC CM/SW Note (Signed)
RE: Chelsea Ruiz Date of Birth: Feb 11, 1940 Date: 11/18/2020   To Whom It May Concern:  Please be advised that the above-named patient will require a short-term nursing home stay - anticipated 30 days or less for rehabilitation and strengthening.  The plan is for return home.

## 2020-11-18 NOTE — Progress Notes (Signed)
PROGRESS NOTE    Chelsea Ruiz  ZOX:[id number] DOB: 1940/08/28 DOA: 11/14/2020 PCP: Orvis Brill, [facility]   Brief Narrative:  80 year old with history of dementia, HTN, DM2, arthritis,, sick sinus syndrome with pacemaker in place, CKD stage IIIb admitted for altered mental status.  Initially found to be lethargic and hypotensive.  Family did report of diarrhea/blood in the stool.  Initially met sepsis criteria concerning for aspiration pneumonia therefore started on IV antibiotics.  EKG was abnormal therefore cardiology team was consulted.  Patient went sigmoidoscopy on 11/16 which showed mucosal ulceration which was biopsied.   Assessment & Plan:   Principal Problem:   AMS (altered mental status) Active Problems:   Chronic kidney disease (CKD), stage III (moderate) (HCC)   Essential hypertension   Memory loss   Type 2 diabetes mellitus with stage 3 chronic kidney disease, without long-term current use of insulin (HCC)   Hypomagnesemia   GI bleed   Acute metabolic encephalopathy, very slowly improving - Etiology is exactly unclear.  CT head is negative, pacemaker is not compatible with MRI.  Previous provider discussed case with neurology who recommended repeating CT head and 24-48 hours which was negative. - UA-negative -Ammonia-normal TSH elevated but T4 was normal -Currently on empiric IV antibiotics for possible aspiration pneumonia  Sepsis secondary to right upper lobe aspiration pneumonia, POA - Sepsis physiology slowly appears to be improving.  Currently patient is on empiric IV Unasyn.  Continue bronchodilators, I-S/flutter valve.  Proctosigmoiditis with mucosal ulceration -GI panel and C. difficile is negative.  Proctosigmoiditis seen on CTA of the abdomen.  Flex sig performed by GI 11/16-showing mucosal ulceration.  Biopsy results are still pending.  GI team is peripherally following.  Concerns of ischemic colitis.  Elevated D-dimer - CTA negative for PE,  showed right upper lobe pneumonia  Hypothyroidism - Daily Synthroid  History of dementia - Supportive care  Essential hypertension - On metoprolol.  IV hydralazine as needed  Diabetes mellitus type 2 - Home regimen on hold.  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  In the meantime we need to make sure we replete electrolytes aggressively and she is able to tolerate appropriate amount of oral intake.  Hopefully we can transition her back to her facility next 24-48 hours.     Subjective: Patient is sitting up in the bed answering basic questions.  Her oral intake is still very poor.  Granddaughter is present at bedside.  Review of Systems Otherwise negative except as per HPI, including: General: Denies fever, chills, night sweats or unintended weight loss. Resp: Denies cough, wheezing, shortness of breath. Cardiac: Denies chest pain, palpitations, orthopnea, paroxysmal nocturnal dyspnea. GI: Denies abdominal pain, nausea, vomiting, diarrhea or constipation GU: Denies dysuria, frequency, hesitancy or incontinence MS: Denies muscle aches, joint pain or swelling Neuro: Denies headache, neurologic deficits (focal weakness, numbness, tingling), abnormal gait Psych: Denies anxiety, depression, SI/HI/AVH Skin: Denies new rashes or lesions ID: Denies sick contacts, exotic exposures, travel  Examination: Constitutional: Not in acute distress Respiratory: Clear to auscultation bilaterally Cardiovascular: Normal  sinus rhythm, no rubs Abdomen: Nontender nondistended good bowel sounds Musculoskeletal: No edema noted  Skin: No rashes seen Neurologic: Patient is alert to her name only.  Grossly moving all the extremities.  Does not appear to have any focal neurodeficits. Psychiatric: Poor judgment and insight  Objective: Vitals:   11/17/20 1537 11/17/20 2007 11/18/20 0504 11/18/20 0829  BP: (!) 158/77 (!) 164/81 (!) 161/65 (!) 155/71  Pulse: 73 71 67 71  Resp:  $Remo'17 16 16  'NcuXP$ Temp: 98.6 F (37 C) 98.4 F (36.9 C) (!) 97.5 F (36.4 C) 97.6 F (36.4 C)  TempSrc: Oral  Oral Oral  SpO2: 97% 100% 98% 98%  Weight:      Height:        Intake/Output Summary (Last 24 hours) at 11/18/2020 1206 Last data filed at 11/18/2020 1032 Gross per 24 hour  Intake 360 ml  Output 1650 ml  Net -1290 ml   Filed Weights   11/14/20 1705 11/17/20 1034  Weight: 71.6 kg 71.6 kg     Data Reviewed:   CBC: Recent Labs  Lab 11/13/20 2011 11/14/20 1811 11/15/20 0611 11/16/20 0446 11/17/20 0526  WBC 8.0 16.6* 16.1* 14.7* 12.3*  NEUTROABS  --  14.9*  --   --   --   HGB 11.9* 14.4 14.6 12.2 11.1*  HCT 36.8 44.9 44.1 36.4 34.2*  MCV 95.1 93.9 92.8 93.3 91.7  PLT 251 223 250 219 536   Basic Metabolic Panel: Recent Labs  Lab 11/14/20 1811 11/15/20 0611 11/16/20 0446 11/17/20 0526 11/18/20 0838  NA 137 139 139 139 139  K 4.3 4.1 3.6 3.4* 3.1*  CL 100 102 104 103 99  CO2 $Re'28 26 27 27 26  'Cvk$ GLUCOSE 200* 162* 131* 122* 141*  BUN 27* 25* 31* 28* 17  CREATININE 1.19* 1.16* 1.47* 1.23* 1.06*  CALCIUM 9.3 8.9 8.6* 8.6* 8.5*  MG  --   --  1.7 1.6* 1.4*  PHOS  --   --  3.8 2.5  --    GFR: Estimated Creatinine Clearance: 40.5 mL/min (A) (by C-G formula based on SCr of 1.06 mg/dL (H)). Liver Function Tests: Recent Labs  Lab 11/14/20 1811 11/17/20 0526  AST 26 19  ALT 20 14  ALKPHOS 95 70  BILITOT 1.0 0.7  PROT 7.1 6.4*  ALBUMIN 3.6 2.9*   No results for input(s): LIPASE, AMYLASE in the last 168  hours. Recent Labs  Lab 11/14/20 2255  AMMONIA 14   Coagulation Profile: No results for input(s): INR, PROTIME in the last 168 hours. Cardiac Enzymes: Recent Labs  Lab 11/15/20 0330  CKTOTAL 54   BNP (last 3 results) No results for input(s): PROBNP in the last 8760 hours. HbA1C: No results for input(s): HGBA1C in the last 72 hours.  CBG: Recent Labs  Lab 11/17/20 1959 11/17/20 2307 11/18/20 0340 11/18/20 0830 11/18/20 1124  GLUCAP 176* 113* 127* 151* 161*   Lipid Profile: No results for input(s): CHOL, HDL, LDLCALC, TRIG, CHOLHDL, LDLDIRECT in the last 72 hours. Thyroid Function Tests: No results for input(s): TSH, T4TOTAL, FREET4, T3FREE, THYROIDAB in the last 72 hours.  Anemia Panel: No results for input(s): VITAMINB12, FOLATE, FERRITIN, TIBC, IRON, RETICCTPCT in the last 72 hours. Sepsis Labs: Recent Labs  Lab 11/15/20 0036 11/15/20 0611 11/15/20 1422 11/16/20 0818  LATICACIDVEN 3.4* 2.1* 2.3* 1.2    Recent Results (from the past 240 hour(s))  CULTURE, BLOOD (ROUTINE X 2) w Reflex to ID Panel     Status: None (Preliminary result)   Collection Time: 11/14/20 10:55 PM   Specimen: BLOOD  Result  Value Ref Range Status   Specimen Description BLOOD RA  Final   Special Requests   Final    BOTTLES DRAWN AEROBIC AND ANAEROBIC Blood Culture adequate volume   Culture   Final    NO GROWTH 4 DAYS Performed at [facility], [address] [place], [place] [address]    Report Status PENDING  Incomplete  Urine Culture     Status: Abnormal   Collection Time: 11/15/20 12:36 AM   Specimen: In/Out Cath Urine  Result Value Ref Range Status   Specimen Description   Final    IN/OUT CATH URINE Performed at [facility], [address] [place], [place] [address]    Special Requests   Final    NONE Performed at [facility], [address] [place], [place] [address]    Culture MULTIPLE SPECIES PRESENT, SUGGEST RECOLLECTION (A)  Final    Report Status 11/16/2020 FINAL  Final  Gastrointestinal Panel by PCR , Stool     Status: None   Collection Time: 11/15/20  6:36 AM   Specimen: Nasal Mucosa; Stool  Result Value Ref Range Status   Campylobacter species NOT DETECTED NOT DETECTED Final   Plesimonas shigelloides NOT DETECTED NOT DETECTED Final   Salmonella species NOT DETECTED NOT DETECTED Final   Yersinia enterocolitica NOT DETECTED NOT DETECTED Final   Vibrio species NOT DETECTED NOT DETECTED Final   Vibrio cholerae NOT DETECTED NOT DETECTED Final   Enteroaggregative E coli (EAEC) NOT DETECTED NOT DETECTED Final   Enteropathogenic E coli (EPEC) NOT DETECTED NOT DETECTED Final   Enterotoxigenic E coli (ETEC) NOT DETECTED NOT DETECTED Final   Shiga like toxin producing E coli (STEC) NOT DETECTED NOT DETECTED Final   Shigella/Enteroinvasive E coli (EIEC) NOT DETECTED NOT DETECTED Final   Cryptosporidium NOT DETECTED NOT DETECTED Final   Cyclospora cayetanensis NOT DETECTED NOT DETECTED Final   Entamoeba histolytica NOT DETECTED NOT DETECTED Final   Giardia lamblia NOT DETECTED NOT DETECTED Final   Adenovirus F40/41 NOT DETECTED NOT DETECTED Final   Astrovirus NOT DETECTED NOT DETECTED Final   Norovirus GI/GII NOT DETECTED NOT DETECTED Final   Rotavirus A NOT DETECTED NOT DETECTED Final   Sapovirus (I, II, IV, and V) NOT DETECTED NOT DETECTED Final    Comment: Performed at [facility], [address] [place], [place] [address]  MRSA Next Gen by PCR, Nasal     Status: None   Collection Time: 11/15/20  6:36 AM   Specimen: Nasal Mucosa; Nasal Swab  Result Value Ref Range Status   MRSA by PCR Next Gen NOT DETECTED NOT DETECTED Final    Comment: (NOTE) The GeneXpert MRSA Assay (FDA approved for NASAL specimens only), is one component of a comprehensive MRSA colonization surveillance program. It is not intended to diagnose MRSA infection nor to guide or monitor treatment for MRSA infections. Test performance is  not FDA approved in patients less than 38 years old. Performed at [facility], [place], [place] [address]   C Difficile Quick Screen w PCR reflex     Status: None   Collection Time: 11/15/20  2:00 PM   Specimen: STOOL  Result Value Ref Range Status   C Diff antigen NEGATIVE NEGATIVE Final   C Diff toxin NEGATIVE NEGATIVE Final   C Diff interpretation No C. difficile detected.  Final    Comment: Performed at [facility], [address], [place], [place] [address]  Culture, blood (Routine  X 2) w Reflex to ID Panel     Status: None (Preliminary result)   Collection Time: 11/15/20  2:22 PM   Specimen: BLOOD  Result Value Ref Range Status   Specimen Description BLOOD BLOOD LEFT HAND  Final   Special Requests   Final    BOTTLES DRAWN AEROBIC AND ANAEROBIC Blood Culture results may not be optimal due to an inadequate volume of blood received in culture bottles   Culture   Final    NO GROWTH 3 DAYS Performed at [facility], [address], [place], [place] [address]    Report Status PENDING  Incomplete         Radiology Studies: ECHOCARDIOGRAM COMPLETE  Result Date: 11/17/2020    ECHOCARDIOGRAM REPORT   Patient Name:   BETSABE IGLESIA Emerick Date of Exam: 11/17/2020 Medical Rec #:  [id number]             Height:       62.0 in Accession #:    [id number]            Weight:       157.8 lb Date of Birth:  12/24/1940             BSA:          1.729 m Patient Age:    68 years              BP:           157/76 mmHg Patient Gender: F                     HR:           79 bpm. Exam Location:  [facility] Procedure: 2D Echo, Color Doppler and Cardiac Doppler Indications:     R94.31 Abnormal ECG; I05.9 Mitral Valve Disorder  History:         Patient has no prior history of Echocardiogram examinations.                  CKD stage III; Risk Factors:Hypertension, Diabetes and Sleep                  Apnea.  Sonographer:     Charmayne Sheer Referring Phys:  [id number] [place]  WHITE Diagnosing Phys: Yolonda Kida MD  Sonographer Comments: Image acquisition challenging due to uncooperative patient. IMPRESSIONS  1. Left ventricular ejection fraction, by estimation, is 60 to 65%. The left ventricle has normal function. The left ventricle has no regional wall motion abnormalities. Left ventricular diastolic parameters were normal.  2. Right ventricular systolic function is low normal. The right ventricular size is mildly enlarged. Mildly increased right ventricular wall thickness.  3. The mitral valve is normal in structure. No evidence of mitral valve regurgitation.  4. The aortic valve is grossly normal. Aortic valve regurgitation is not visualized. FINDINGS  Left Ventricle: Left ventricular ejection fraction, by estimation, is 60 to 65%. The left ventricle has normal function. The left ventricle has no regional wall motion abnormalities. The left ventricular internal cavity size was normal in size. There is  borderline concentric left ventricular hypertrophy. Left ventricular diastolic parameters were normal. Right Ventricle: The right ventricular size is mildly enlarged. Mildly increased right ventricular wall thickness. Right ventricular systolic function is low normal. Left Atrium: Left atrial size was normal in size. Right Atrium: Right atrial size was normal in size. Pericardium: There is no evidence of pericardial effusion. Mitral Valve: The mitral valve  is normal in structure. There is mild thickening of the mitral valve leaflet(s). No evidence of mitral valve regurgitation. MV peak gradient, 10.6 mmHg. The mean mitral valve gradient is 3.0 mmHg. Tricuspid Valve: The tricuspid valve is normal in structure. Tricuspid valve regurgitation is trivial. Aortic Valve: The aortic valve is grossly normal. Aortic valve regurgitation is not visualized. Aortic valve mean gradient measures 11.3 mmHg. Aortic valve peak gradient measures 19.0 mmHg. Aortic valve area, by VTI measures 0.99 cm.  Pulmonic Valve: The pulmonic valve was normal in structure. Pulmonic valve regurgitation is not visualized. Aorta: The ascending aorta was not well visualized. IAS/Shunts: No atrial level shunt detected by color flow Doppler. Additional Comments: A device lead is visualized.  LEFT VENTRICLE PLAX 2D LVIDd:         3.70 cm LVIDs:         2.50 cm LV PW:         0.90 cm LV IVS:        0.90 cm LVOT diam:     1.60 cm LV SV:         41 LV SV Index:   24 LVOT Area:     2.01 cm  RIGHT VENTRICLE RV Basal diam:  3.10 cm LEFT ATRIUM           Index LA diam:      1.90 cm 1.10 cm/m LA Vol (A2C): 23.8 ml 13.77 ml/m LA Vol (A4C): 57.5 ml 33.26 ml/m  AORTIC VALVE                     PULMONIC VALVE AV Area (Vmax):    0.92 cm      PV Vmax:       0.90 m/s AV Area (Vmean):   0.89 cm      PV Vmean:      71.000 cm/s AV Area (VTI):     0.99 cm      PV VTI:        0.222 m AV Vmax:           218.00 cm/s   PV Peak grad:  3.3 mmHg AV Vmean:          155.333 cm/s  PV Mean grad:  2.0 mmHg AV VTI:            0.414 m AV Peak Grad:      19.0 mmHg AV Mean Grad:      11.3 mmHg LVOT Vmax:         100.00 cm/s LVOT Vmean:        69.100 cm/s LVOT VTI:          0.204 m LVOT/AV VTI ratio: 0.49  AORTA Ao Root diam: 2.10 cm MITRAL VALVE                TRICUSPID VALVE MV Area (PHT): 4.57 cm     TR Peak grad:   21.7 mmHg MV Area VTI:   1.30 cm     TR Vmax:        233.00 cm/s MV Peak grad:  10.6 mmHg MV Mean grad:  3.0 mmHg     SHUNTS MV Vmax:       1.63 m/s     Systemic VTI:  0.20 m MV Vmean:      80.3 cm/s    Systemic Diam: 1.60 cm MV Decel Time: 166 msec MV E velocity: 120.50 cm/s Yolonda Kida MD Electronically signed by Yolonda Kida  MD Signature Date/Time: 11/17/2020/1:33:59 PM    Final         Scheduled Meds:  chlorhexidine  15 mL Mouth Rinse BID   Chlorhexidine Gluconate Cloth  6 each Topical Daily   feeding supplement (GLUCERNA SHAKE)  237 mL Oral TID BM   insulin aspart  0-9 Units Subcutaneous Q4H   levothyroxine  50 mcg  Intravenous Daily   mouth rinse  15 mL Mouth Rinse q12n4p   metoprolol tartrate  12.5 mg Oral BID   pantoprazole (PROTONIX) IV  40 mg Intravenous Daily   sodium chloride flush  3 mL Intravenous Q12H   Continuous Infusions:  ampicillin-sulbactam (UNASYN) IV 3 g (11/18/20 0500)   magnesium sulfate bolus IVPB     potassium chloride       LOS: 4 days   Time spent= 35 mins    Carter Kassel Arsenio Loader, MD [facility]  If 7PM-7AM, please contact night-coverage  11/18/2020, 12:06 PM

## 2020-11-18 NOTE — Care Plan (Signed)
Biopsies consistent with ischemic colitis. No specific recommendations besides trying avoid hypotension.  Merlyn Lot MD, MPH [facility]

## 2020-11-18 NOTE — Care Management Important Message (Signed)
Important Message  Patient Details  Name: Chelsea Ruiz MRN: [id number] Date of Birth: Jan 11, 1940   Medicare Important Message Given:  Yes     [name] 11/18/2020, 10:47 AM

## 2020-11-18 NOTE — Plan of Care (Signed)

## 2020-11-18 NOTE — NC FL2 (Signed)
[place] LEVEL OF CARE SCREENING TOOL     IDENTIFICATION  Patient Name: Chelsea Ruiz Birthdate: 10/02/1940 Sex: female Admission Date (Current Location): 11/14/2020  [facility] and [place] Number:  Engineering geologist and Address:  [facility], [address] , [place] [address]      Provider Number: [id number]  Attending Physician Name and Address:  Damita Lack, MD  Relative Name and Phone Number:       Current Level of Care: Hospital Recommended Level of Care: [place] Prior Approval Number:    Date Approved/Denied:   PASRR Number: Manual review  Discharge Plan: SNF    Current Diagnoses: Patient Active Problem List   Diagnosis Date Noted   AMS (altered mental status) 11/14/2020   GI bleed 11/14/2020   Hypomagnesemia 03/06/2018   Peripheral neuropathy 03/06/2018   Degenerative arthritis of lumbar spine 02/20/2018   Chronic bilateral low back pain without sciatica (Primary Area of Pain) (L>R) 02/20/2018   Pain in both hands (Secondary Area of Pain) (L>R) 02/20/2018   Chronic neck pain (Tertiary Area of Pain) 02/20/2018   Chronic pain syndrome 02/20/2018   Long term current use of opiate analgesic 02/20/2018   Pharmacologic therapy 02/20/2018   Disorder of skeletal system 02/20/2018   Problems influencing health status 02/20/2018   Memory loss 12/12/2017   Pedal edema 01/03/2017   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/27/2016   Bradycardia, sinus 11/07/2016   Lightheadedness 10/23/2016   S/P total knee replacement, right 06/28/2016   Atrioventricular block, second degree 06/12/2016   Nonrheumatic aortic valve stenosis 06/12/2016   Chronic pain of right knee (Fourth Area of Pain) 06/05/2016   Abnormal ECG 05/31/2016   SOB (shortness of breath) on exertion 05/31/2016   Sleep apnea 12/10/2015   Wears hearing aid 12/10/2015   Malfunction of spinal cord stimulator ([place])  11/23/2015   Chronic pain of multiple joints 11/11/2015   Essential hypertension 01/27/2015   Fatty (change of) liver, not elsewhere classified 01/27/2015   Hyperlipidemia 01/27/2015   Thyroid disease 01/27/2015   Type 2 diabetes mellitus with stage 3 chronic kidney disease, without long-term current use of insulin ([place]) 01/27/2015   Difficulty walking 11/26/2013   Foot drop, left foot 11/26/2013   Unspecified abnormalities of gait and mobility 11/26/2013   Other closed fractures of distal end of radius (alone) 10/15/2013   Depression 01/31/2012   Biomechanical lesion, unspecified 09/19/2011   Other secondary scoliosis, lumbar region 08/30/2011   Spinal stenosis, lumbar region without neurogenic claudication 08/30/2011   Chronic kidney disease (CKD), stage III (moderate) ([place]) 04/25/2011   Chronic venous insufficiency 03/13/2011   Mitral valve prolapse 03/13/2011   Fibromyalgia 02/08/2011   Other cervical disc degeneration, unspecified cervical region 02/08/2011   Arthropathic psoriasis, 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 Extra gravy on meats, potatoes. Yogurt, puddings. Cream Soups.)  AMBULATORY STATUS COMMUNICATION OF NEEDS Skin   Extensive Assist Verbally Skin abrasions (Skin tear on left arm: Foam.)                       Personal Care Assistance Level of Assistance  Bathing, Feeding, Dressing Bathing Assistance: Maximum assistance  Feeding assistance: Maximum assistance Dressing Assistance: Maximum assistance     Functional Limitations Info  Sight, Hearing, Speech Sight Info: Adequate Hearing Info: Adequate Speech Info: Adequate    SPECIAL CARE FACTORS FREQUENCY  PT (By  licensed PT), OT (By licensed OT)     PT Frequency: 5 x week OT Frequency: 5 x week            Contractures Contractures Info: Not present    Additional Factors Info  Code Status, Allergies, Psychotropic Code Status Info: DNR Allergies Info: Methadone, Morphine and related, Oxycodone, Lamotrigine Psychotropic Info: Depression         Current Medications (11/18/2020):  This is the current hospital active medication list Current Facility-Administered Medications  Medication Dose Route Frequency Provider Last Rate Last Admin   acetaminophen (TYLENOL) tablet 650 mg  650 mg Oral Q6H PRN Para Skeans, MD   650 mg at 11/15/20 2057   Or   acetaminophen (TYLENOL) suppository 650 mg  650 mg Rectal Q6H PRN Para Skeans, MD       Ampicillin-Sulbactam (UNASYN) 3 g in sodium chloride 0.9 % 100 mL IVPB  3 g Intravenous Q6H Florina Ou V, MD 200 mL/hr at 11/18/20 0500 3 g at 11/18/20 0500   chlorhexidine (PERIDEX) 0.12 % solution 15 mL  15 mL Mouth Rinse BID Shawna Clamp, MD   15 mL at 11/17/20 2100   Chlorhexidine Gluconate Cloth 2 % PADS 6 each  6 each Topical Daily Para Skeans, MD   6 each at 11/17/20 1020   feeding supplement (GLUCERNA SHAKE) (GLUCERNA SHAKE) liquid 237 mL  237 mL Oral TID BM Amin, Ankit Chirag, MD       haloperidol lactate (HALDOL) injection 1 mg  1 mg Intravenous Q6H PRN Amin, Ankit Chirag, MD       hydrALAZINE (APRESOLINE) injection 5 mg  5 mg Intravenous Q4H PRN Para Skeans, MD       insulin aspart (novoLOG) injection 0-9 Units  0-9 Units Subcutaneous Q4H Sharion Settler, NP   1 Units at 11/18/20 0458   levothyroxine (SYNTHROID, LEVOTHROID) injection 50 mcg  50 mcg Intravenous Daily Sharion Settler, NP   50 mcg at 11/17/20 1242   magnesium sulfate IVPB 4 g 100 mL  4 g Intravenous Once Amin, Ankit Chirag, MD       MEDLINE mouth rinse  15 mL Mouth Rinse q12n4p Shawna Clamp, MD   15 mL at 11/17/20 1725   metoprolol tartrate (LOPRESSOR) tablet 12.5 mg  12.5 mg  Oral BID Shawna Clamp, MD   12.5 mg at 11/17/20 2100   ondansetron (ZOFRAN) tablet 4 mg  4 mg Oral Q6H PRN Para Skeans, MD       Or   ondansetron [facility]) injection 4 mg  4 mg Intravenous Q6H PRN Para Skeans, MD       pantoprazole (PROTONIX) injection 40 mg  40 mg Intravenous Daily Shawna Clamp, MD   40 mg at 11/17/20 1241   potassium chloride 10 mEq in 100 mL IVPB  10 mEq Intravenous Q1 Hr x 4 Amin, Ankit Chirag, MD       sodium chloride flush (NS) 0.9 % injection 3 mL  3 mL Intravenous Q12H Para Skeans, MD   3 mL at 11/17/20 2100     Discharge Medications: Please see discharge summary for a list of discharge medications.  Relevant Imaging Results:  Relevant Lab Results:   Additional Information SS#: [id number]. From [facility].  Margarito Liner, LCSW

## 2020-11-19 DIAGNOSIS — I10 Essential (primary) hypertension: Secondary | ICD-10-CM | POA: Diagnosis not present

## 2020-11-19 DIAGNOSIS — R4182 Altered mental status, unspecified: Secondary | ICD-10-CM | POA: Diagnosis not present

## 2020-11-19 DIAGNOSIS — E1122 Type 2 diabetes mellitus with diabetic chronic kidney disease: Secondary | ICD-10-CM | POA: Diagnosis not present

## 2020-11-19 DIAGNOSIS — K922 Gastrointestinal hemorrhage, unspecified: Secondary | ICD-10-CM | POA: Diagnosis not present

## 2020-11-19 LAB — CBC
HCT: 37.9 % (ref 36.0–46.0)
Hemoglobin: 12.5 g/dL (ref 12.0–15.0)
MCH: 30.3 pg (ref 26.0–34.0)
MCHC: 33 g/dL (ref 30.0–36.0)
MCV: 92 fL (ref 80.0–100.0)
Platelets: 313 10*3/uL (ref 150–400)
RBC: 4.12 MIL/uL (ref 3.87–5.11)
RDW: 13 % (ref 11.5–15.5)
WBC: 14.1 10*3/uL — ABNORMAL HIGH (ref 4.0–10.5)
nRBC: 0 % (ref 0.0–0.2)

## 2020-11-19 LAB — CULTURE, BLOOD (ROUTINE X 2)
Culture: NO GROWTH
Special Requests: ADEQUATE

## 2020-11-19 LAB — BASIC METABOLIC PANEL
Anion gap: 9 (ref 5–15)
BUN: 20 mg/dL (ref 8–23)
CO2: 27 mmol/L (ref 22–32)
Calcium: 8.8 mg/dL — ABNORMAL LOW (ref 8.9–10.3)
Chloride: 104 mmol/L (ref 98–111)
Creatinine, Ser: 1.08 mg/dL — ABNORMAL HIGH (ref 0.44–1.00)
GFR, Estimated: 53 mL/min — ABNORMAL LOW (ref >60)
Glucose, Bld: 127 mg/dL — ABNORMAL HIGH (ref 70–99)
Potassium: 3.8 mmol/L (ref 3.5–5.1)
Sodium: 140 mmol/L (ref 135–145)

## 2020-11-19 LAB — GLUCOSE, CAPILLARY
Glucose-Capillary: 111 mg/dL — ABNORMAL HIGH (ref 70–99)
Glucose-Capillary: 145 mg/dL — ABNORMAL HIGH (ref 70–99)
Glucose-Capillary: 191 mg/dL — ABNORMAL HIGH (ref 70–99)
Glucose-Capillary: 134 mg/dL — ABNORMAL HIGH (ref 70–99)

## 2020-11-19 LAB — RESP PANEL BY RT-PCR (FLU A&B, COVID) ARPGX2
Influenza A by PCR: NEGATIVE
Influenza B by PCR: NEGATIVE
SARS Coronavirus 2 by RT PCR: NEGATIVE

## 2020-11-19 LAB — MAGNESIUM: Magnesium: 2 mg/dL (ref 1.7–2.4)

## 2020-11-19 MED ORDER — PANTOPRAZOLE SODIUM 40 MG PO TBEC: 40.0000 mg | DELAYED_RELEASE_TABLET | Freq: Every day | ORAL | 1 refills | Status: AC

## 2020-11-19 MED ORDER — METOPROLOL TARTRATE 25 MG PO TABS: 12.5000 mg | ORAL_TABLET | Freq: Two times a day (BID) | ORAL | Status: DC

## 2020-11-19 MED ORDER — TRAMADOL HCL 50 MG PO TABS: 50.0000 mg | ORAL_TABLET | Freq: Two times a day (BID) | ORAL | Status: DC | PRN

## 2020-11-19 MED ORDER — TRAMADOL HCL 50 MG PO TABS: 50.0000 mg | ORAL_TABLET | Freq: Two times a day (BID) | ORAL | 0 refills | Status: DC | PRN

## 2020-11-19 MED ORDER — HYDROCODONE-ACETAMINOPHEN 5-325 MG PO TABS: 0.5000 | ORAL_TABLET | Freq: Three times a day (TID) | ORAL | 0 refills | Status: DC

## 2020-11-19 NOTE — TOC Progression Note (Signed)
Transition of Care [facility]) - Progression Note    Patient Details  Name: Chelsea Ruiz MRN: [id number] Date of Birth: [birth year]  Transition of Care [facility]) CM/SW Contact  Chapman Fitch, RN Phone Number: 11/19/2020, 10:40 AM  Clinical Narrative:     Sherron Monday with Verlon Au at [facility].  They would be unable to offer a bed until Monday [facility] has declined bed offer.  Message sent to daughter Tresa Endo to discuss disposition   Expected Discharge Plan: Memory Care Barriers to Discharge: Continued Medical Work up  Expected Discharge Plan and Services Expected Discharge Plan: Memory Care   Discharge Planning Services: CM Consult   Living arrangements for the past 2 months:  (memory care)                                       Social Determinants of Health (SDOH) Interventions    Readmission Risk Interventions Readmission Risk Prevention Plan 11/17/2020  Transportation Screening Complete  Social Work Consult for Recovery Care Planning/Counseling Complete  Palliative Care Screening Complete  Medication Review Oceanographer) Complete  Some recent data might be hidden

## 2020-11-19 NOTE — Progress Notes (Signed)
PROGRESS NOTE    Chelsea Ruiz  OFB:[id number] DOB: 1940/01/17 DOA: 11/14/2020 PCP: Orvis Brill, [facility]   Brief Narrative:  79 year old with history of dementia, HTN, DM2, arthritis,, sick sinus syndrome with pacemaker in place, CKD stage IIIb admitted for altered mental status.  Initially found to be lethargic and hypotensive.  Family did report of diarrhea/blood in the stool.  Initially met sepsis criteria concerning for aspiration pneumonia therefore started on IV antibiotics.  EKG was abnormal therefore cardiology team was consulted.  Patient went sigmoidoscopy on 11/16 which showed mucosal ulceration which was biopsied.  Biopsy results were positive for ischemic colitis.   Assessment & Plan:   Principal Problem:   AMS (altered mental status) Active Problems:   Chronic kidney disease (CKD), stage III (moderate) (HCC)   Essential hypertension   Memory loss   Type 2 diabetes mellitus with stage 3 chronic kidney disease, without long-term current use of insulin (HCC)   Hypomagnesemia   GI bleed   Acute metabolic encephalopathy, improved - Etiology is exactly unclear.  CT head is negative, pacemaker is not compatible with MRI.  Previous provider discussed case with neurology who recommended repeating CT head and 24-48 hours which was negative. - UA-negative -Ammonia-normal TSH elevated but T4 was normal - Last day of antibiotics today.  Sepsis secondary to right upper lobe aspiration pneumonia, POA - Sepsis physiology has resolved.  Last day of IV Unasyn today.  Supportive care.  Proctosigmoiditis with mucosal ulceration -GI panel and C. difficile is negative.  Proctosigmoiditis seen on CTA of the abdomen.  Flex sig performed by GI 11/16-showing mucosal ulceration.  Biopsies consistent with ischemic colitis per GI team.  Continue supportive care and avoid hypotension  Elevated D-dimer - CTA negative for PE, showed right upper lobe pneumonia  Hypothyroidism -  Daily Synthroid  History of dementia - Supportive care  Essential hypertension - On metoprolol.  IV hydralazine as needed  Diabetes mellitus type 2 - Home regimen on hold.  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  Currently awaiting placement.   Subjective: Seen and examined at bedside, she is watching TV.  Granddaughter is at bedside.  No complaints.   Examination: Constitutional: Not in acute distress Respiratory: Clear to auscultation bilaterally Cardiovascular: Normal sinus rhythm, no rubs Abdomen: Nontender nondistended good bowel sounds Musculoskeletal: No edema noted Skin: Some superficial skin bruising.  Left-sided old facial bruise Neurologic: CN 2-12 grossly intact.  And nonfocal Psychiatric: Alert to name and place.  Objective: Vitals:   11/18/20 1542 11/18/20 1939 11/19/20 0419 11/19/20 0815  BP: (!) 153/71 (!) 176/85 (!) 168/84 (!) 152/39  Pulse: 65 64 68 65  Resp: $Remo'17 18 18 17  'UpZCB$ Temp: 97.7 F (36.5 C) (!) 97.5 F (36.4 C) 97.8 F (36.6 C) 97.6 F (36.4 C)  TempSrc: Oral Oral Oral   SpO2: 98% 98% 97% 100%  Weight:      Height:        Intake/Output Summary (Last 24 hours) at 11/19/2020 1154 Last data filed at 11/19/2020 1023 Gross per 24 hour  Intake 1283.87 ml  Output --  Net 1283.87 ml   [employer]  11/14/20 1705 11/17/20 1034  Weight: 71.6 kg 71.6 kg     Data Reviewed:   CBC: Recent Labs  Lab 11/14/20 1811 11/15/20 0611 11/16/20 0446 11/17/20 0526 11/19/20 0747  WBC 16.6* 16.1* 14.7* 12.3* 14.1*   NEUTROABS 14.9*  --   --   --   --   HGB 14.4 14.6 12.2 11.1* 12.5  HCT 44.9 44.1 36.4 34.2* 37.9  MCV 93.9 92.8 93.3 91.7 92.0  PLT 223 250 219 221 381   Basic Metabolic Panel: Recent Labs  Lab 11/15/20 0611 11/16/20 0446 11/17/20 0526 11/18/20 0838 11/19/20 0747  NA 139 139 139 139 140  K 4.1 3.6 3.4* 3.1* 3.8  CL 102 104 103 99 104  CO2 $Re'26 27 27 26 27  'WYf$ GLUCOSE 162* 131* 122* 141* 127*  BUN 25* 31* 28* 17 20  CREATININE 1.16* 1.47* 1.23* 1.06* 1.08*  CALCIUM 8.9 8.6* 8.6* 8.5* 8.8*  MG  --  1.7 1.6* 1.4* 2.0  PHOS  --  3.8 2.5  --   --    GFR: Estimated Creatinine Clearance: 39.8 mL/min (A) (by C-G formula based on SCr of 1.08 mg/dL (H)). Liver Function Tests: Recent Labs  Lab 11/14/20 1811 11/17/20 0526  AST 26 19  ALT 20 14  ALKPHOS 95 70  BILITOT 1.0 0.7  PROT 7.1 6.4*  ALBUMIN 3.6 2.9*   No results for input(s): LIPASE, AMYLASE in the last 168 hours. Recent Labs  Lab 11/14/20 2255  AMMONIA 14   Coagulation Profile: No results for input(s): INR, PROTIME in the last 168 hours. Cardiac Enzymes: Recent Labs  Lab 11/15/20 0330  CKTOTAL 54   BNP (last 3 results) No results for input(s): PROBNP in the last 8760 hours. HbA1C: No results for input(s): HGBA1C in the last 72 hours.  CBG: Recent Labs  Lab 11/18/20 2017 11/18/20 2330 11/19/20 0410 11/19/20 0848 11/19/20 1151  GLUCAP 142* 170* 111* 145* 191*   Lipid Profile: No results for input(s): CHOL, HDL, LDLCALC, TRIG, CHOLHDL, LDLDIRECT in the last 72 hours. Thyroid Function Tests: No results for input(s): TSH, T4TOTAL, FREET4, T3FREE, THYROIDAB in the last 72 hours.  Anemia Panel: No results for input(s): VITAMINB12, FOLATE, FERRITIN, TIBC, IRON, RETICCTPCT in the last 72 hours. Sepsis Labs: Recent Labs  Lab 11/15/20 0036 11/15/20 0611 11/15/20 1422 11/16/20 0818  LATICACIDVEN 3.4* 2.1* 2.3* 1.2    Recent Results (from the past 240 hour(s))  CULTURE, BLOOD (ROUTINE X 2) w Reflex  to ID Panel     Status: None   Collection Time: 11/14/20 10:55 PM   Specimen: BLOOD  Result Value Ref Range Status   Specimen Description BLOOD RA  Final   Special Requests   Final    BOTTLES DRAWN AEROBIC AND ANAEROBIC Blood Culture adequate volume   Culture   Final    NO GROWTH 5 DAYS Performed at [facility], [address] [place], [place] [address]    Report Status 11/19/2020 FINAL  Final  Urine Culture     Status: Abnormal   Collection Time: 11/15/20 12:36 AM   Specimen: In/Out Cath Urine  Result Value Ref Range Status   Specimen Description   Final    IN/OUT CATH URINE Performed at [facility], [address] [place], [place] [address]    Special Requests   Final    NONE Performed at [facility], [address] [place], [place] [address]    Culture MULTIPLE SPECIES PRESENT, SUGGEST RECOLLECTION (A)  Final   Report Status 11/16/2020 FINAL  Final  Gastrointestinal Panel by PCR , Stool     Status: None   Collection Time: 11/15/20  6:36 AM   Specimen: Nasal Mucosa; Stool  Result Value Ref Range Status   Campylobacter species NOT DETECTED NOT DETECTED Final   Plesimonas shigelloides NOT DETECTED NOT DETECTED Final   Salmonella species NOT DETECTED NOT DETECTED Final   Yersinia enterocolitica NOT DETECTED NOT DETECTED Final   Vibrio species NOT DETECTED NOT DETECTED Final   Vibrio cholerae NOT DETECTED NOT DETECTED Final   Enteroaggregative E coli (EAEC) NOT DETECTED NOT DETECTED Final   Enteropathogenic E coli (EPEC) NOT DETECTED NOT DETECTED Final   Enterotoxigenic E coli (ETEC) NOT DETECTED NOT DETECTED Final   Shiga like toxin producing E coli (STEC) NOT DETECTED NOT DETECTED Final   Shigella/Enteroinvasive E coli (EIEC) NOT DETECTED NOT DETECTED Final   Cryptosporidium NOT DETECTED NOT DETECTED Final   Cyclospora cayetanensis NOT DETECTED NOT DETECTED Final   Entamoeba histolytica NOT DETECTED NOT DETECTED Final   Giardia lamblia  NOT DETECTED NOT DETECTED Final   Adenovirus F40/41 NOT DETECTED NOT DETECTED Final   Astrovirus NOT DETECTED NOT DETECTED Final   Norovirus GI/GII NOT DETECTED NOT DETECTED Final   Rotavirus A NOT DETECTED NOT DETECTED Final   Sapovirus (I, II, IV, and V) NOT DETECTED NOT DETECTED Final    Comment: Performed at [facility], [address] [place], [place] [address]  MRSA Next Gen by PCR, Nasal     Status: None   Collection Time: 11/15/20  6:36 AM   Specimen: Nasal Mucosa; Nasal Swab  Result Value Ref Range Status   MRSA by PCR Next Gen NOT DETECTED NOT DETECTED Final    Comment: (NOTE) The GeneXpert MRSA Assay (FDA approved for NASAL specimens only), is one component of a comprehensive MRSA colonization surveillance program. It is not intended to diagnose MRSA infection nor to guide or monitor treatment for MRSA infections. Test performance is not FDA approved in patients less than 75 years old. Performed at [facility], [place], [place] [address]   C Difficile Quick Screen w PCR reflex     Status: None   Collection Time: 11/15/20  2:00 PM   Specimen: STOOL  Result Value Ref Range Status   C Diff antigen NEGATIVE NEGATIVE Final   C Diff toxin NEGATIVE NEGATIVE Final   C Diff interpretation No C. difficile detected.  Final    Comment: Performed at [facility], [address] [place], [place] [address]  Culture, blood (Routine X 2) w Reflex to ID Panel     Status: None (Preliminary result)   Collection Time: 11/15/20  2:22 PM   Specimen: BLOOD  Result Value Ref Range Status   Specimen Description BLOOD BLOOD LEFT HAND  Final   Special Requests   Final    BOTTLES DRAWN AEROBIC AND ANAEROBIC Blood Culture results may not be optimal due to an inadequate volume of blood received in culture bottles   Culture   Final    NO GROWTH 4 DAYS Performed at [facility], [address] [place],  [address]    Report Status  PENDING  Incomplete         Radiology Studies: No results found.      Scheduled Meds:  chlorhexidine  15 mL Mouth Rinse BID   feeding supplement (GLUCERNA SHAKE)  237 mL Oral TID BM   insulin  aspart  0-9 Units Subcutaneous Q4H   levothyroxine  50 mcg Intravenous Daily   mouth rinse  15 mL Mouth Rinse q12n4p   metoprolol tartrate  12.5 mg Oral BID   pantoprazole (PROTONIX) IV  40 mg Intravenous Daily   sodium chloride flush  3 mL Intravenous Q12H   Continuous Infusions:  ampicillin-sulbactam (UNASYN) IV 3 g (11/19/20 0545)     LOS: 5 days   Time spent= 35 mins    Emigdio Wildeman Arsenio Loader, MD [facility]  If 7PM-7AM, please contact night-coverage  11/19/2020, 11:54 AM

## 2020-11-19 NOTE — TOC Transition Note (Signed)
Transition of Care [facility]) - CM/SW Discharge Note   Patient Details  Name: Chelsea Ruiz MRN: [id number] Date of Birth: 02/26/1940  Transition of [facility]) CM/SW Contact:  Chelsea Fitch, RN Phone Number: 11/19/2020, 3:24 PM   Clinical Narrative:    Daughter has accepted bed a [facility] and rehab  Patient will DC IO:[id number] Anticipated DC date:11/19/20  Family notified:Chelsea Ruiz Transport by:EMS  Per [facility] they no longer require a covid test prior to discharge   Per MD patient ready for DC to . RN, patient's family, and facility notified of DC. Discharge Summary sent to facility. RN given number for report. DC packet on chart. Ambulance transport requested for patient.  TOC signing off.  Chelsea Ruiz [facility] [phone]     Barriers to Discharge: Continued Medical Work up   Patient Goals and CMS Choice        Discharge Placement                       Discharge Plan and Services   Discharge Planning Services: CM Consult                                 Social Determinants of Health (SDOH) Interventions     Readmission Risk Interventions Readmission Risk Prevention Plan 11/17/2020  Transportation Screening Complete  Social Work Consult for Recovery Care Planning/Counseling Complete  Palliative Care Screening Complete  Medication Review Oceanographer) Complete  Some recent data might be hidden

## 2020-11-19 NOTE — Discharge Summary (Signed)
Physician Discharge Summary  Chelsea Ruiz ZJI:[id number] DOB: [birth year] DOA: 11/14/2020  PCP: Orvis Brill, [facility]  Admit date: 11/14/2020 Discharge date: 11/19/2020  Admitted From: SNF Disposition: SNF  Recommendations for Outpatient Follow-up:  Follow up with PCP in 1-2 weeks Please obtain BMP/CBC in one week your next doctors visit.  Closely monitor vital signs and blood pressure.  Avoid any hypotension. Bowel regimen as necessary to have at least 1-2 soft bowel movements daily. Aspiration precautions   Discharge Condition: Stable CODE STATUS: DNR Diet recommendation: Heart healthy/diabetic  Brief/Interim Summary: 80 year old with history of dementia, HTN, DM2, arthritis,, sick sinus syndrome with pacemaker in place, CKD stage IIIb admitted for altered mental status.  Initially found to be lethargic and hypotensive.  Family did report of diarrhea/blood in the stool.  Initially met sepsis criteria concerning for aspiration pneumonia therefore started on IV antibiotics.  EKG was abnormal therefore cardiology team was consulted.  Patient went sigmoidoscopy on 11/16 which showed mucosal ulceration which was biopsied.  Biopsy results were positive for ischemic colitis.  PT recommended SNF, arrangements were made by [facility] team.  Medically stable for discharge, rest of the recommendations as stated above.     Assessment & Plan:   Principal Problem:   AMS (altered mental status) Active Problems:   Chronic kidney disease (CKD), stage III (moderate) (HCC)   Essential hypertension   Memory loss   Type 2 diabetes mellitus with stage 3 chronic kidney disease, without long-term current use of insulin (HCC)   Hypomagnesemia   GI bleed     Acute metabolic encephalopathy, improved - Etiology is exactly unclear.  CT head is negative, pacemaker is not compatible with MRI.  Previous provider discussed case with neurology who recommended repeating CT head and 24-48 hours which was  negative. - UA-negative -Ammonia-normal TSH elevated but T4 was normal - Completed antibiotic on the last day of hospital   Sepsis secondary to right upper lobe aspiration pneumonia, POA - Sepsis physiology has resolved.  Completed antibiotic treatment.  Continue supportive care as outpatient.   Proctosigmoiditis with mucosal ulceration -GI panel and C. difficile is negative.  Proctosigmoiditis seen on CTA of the abdomen.  Flex sig performed by GI 11/16-showing mucosal ulceration.  Biopsies consistent with ischemic colitis per GI team.  Continue supportive care and avoid hypotension   Elevated D-dimer - CTA negative for PE, showed right upper lobe pneumonia   Hypothyroidism - Daily Synthroid   History of dementia - Supportive care   Essential hypertension - On metoprolol.     Diabetes mellitus type 2 - Resume her home regimen.  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         Discharge Diagnoses:  Principal Problem:   AMS (altered mental status) Active Problems:   Chronic kidney disease (CKD), stage III (moderate) (HCC)   Essential hypertension   Memory loss   Type 2 diabetes mellitus with stage 3 chronic kidney disease, without long-term current use of insulin (HCC)   Hypomagnesemia   GI bleed      Consultations: Gastroenterology  Subjective: No acute events overnight.  Stable for discharge Granddaughter at bedside during my visit  Discharge Exam: Vitals:   11/19/20 0419 11/19/20 0815  BP: (!) 168/84 (!) 152/39  Pulse: 68 65  Resp: 18 17  Temp: 97.8 F (36.6 C) 97.6 F (36.4 C)  SpO2: 97% 100%   Vitals:   11/18/20 1542 11/18/20 1939 11/19/20 0419 11/19/20 0815  BP: (!) 153/71 (!) 176/85 (!) 168/84 (!) 152/39   Pulse: 65 64 68 65  Resp: _0 Temp: 97.7 F (36.5 C) (!) 97.5 F (36.4 C) 97.8 F (36.6 C) 97.6 F (36.4 C)  TempSrc: Oral Oral Oral   SpO2: 98% 98% 97% 100%  Weight:      Height:        General: Pt is alert, awake, not in acute distress Cardiovascular: RRR, S1/S2 +, no rubs, no gallops Respiratory: CTA bilaterally, no wheezing, no rhonchi Abdominal: Soft, NT, ND, bowel sounds + Extremities: no edema, no cyanosis  Discharge Instructions   Allergies as of 11/19/2020       Reactions   Methadone Other (See Comments)   Hallucinations and memory   Morphine And Related Other (See Comments)   Abnormal thinking and memory loss   Oxycodone Other (See Comments)   Hallucinations and memory   Lamotrigine Palpitations        Medication List     STOP taking these medications    hydrochlorothiazide 12.5 MG tablet Commonly known as: HYDRODIURIL   lisinopril 30 MG tablet Commonly known as: ZESTRIL   metFORMIN 500 MG tablet Commonly known as: GLUCOPHAGE   MULTIVITAMIN PO   oxymorphone 5 MG tablet Commonly known as: OPANA   pioglitazone 15 MG tablet Commonly known as: ACTOS   vitamin B-12 100 MCG tablet Commonly known as: CYANOCOBALAMIN       TAKE these medications    acetaminophen 500 MG tablet Commonly known as: TYLENOL Take 1,000 mg by mouth every 6 (six) hours as needed.   aspirin EC 81 MG tablet Take 81 mg daily by mouth.   atorvastatin 80 MG tablet Commonly known as: LIPITOR Take 40 mg daily by mouth.   buPROPion 150 MG 24 hr tablet Commonly known as: WELLBUTRIN XL Take 150 mg daily by mouth.   Desitin 13 % Crea Generic drug: Zinc Oxide Apply 1 application topically daily at 12 noon.   donepezil 5 MG tablet Commonly known as: ARICEPT Take 5 mg by mouth daily.   DULoxetine 60 MG capsule Commonly known as: CYMBALTA Take 60 mg daily by mouth.   gabapentin 100 MG capsule Commonly known as: NEURONTIN Take 100 mg by mouth daily at  12 noon.   gabapentin 300 MG capsule Commonly known as: NEURONTIN Take 300 mg by mouth 3 (three) times daily.   HYDROcodone-acetaminophen 5-325 MG tablet Commonly known as: NORCO/VICODIN Take 0.5 tablets by mouth 3 (three) times daily.   hydrOXYzine 25 MG tablet Commonly known as: ATARAX/VISTARIL Take 25 mg by mouth 3 (three) times daily as needed.   levothyroxine 75 MCG tablet Commonly known as: SYNTHROID Take 75 mcg daily by mouth.   losartan 50 MG tablet Commonly known as: COZAAR Take 50 mg by mouth daily.   Magnesium 500 MG Caps Take 1 capsule (500 mg total) by mouth daily.   magnesium oxide 400 MG tablet Commonly known as: MAG-OX Take 400 mg by mouth 2 (two) times daily.   melatonin 3 MG Tabs tablet Take 3 mg by mouth at bedtime.   metoprolol tartrate 25 MG tablet Commonly known as: LOPRESSOR Take 0.5 tablets (12.5 mg total) by mouth 2 (two) times daily.   pantoprazole 40 MG tablet Commonly known as: Protonix Take 1 tablet (40 mg total) by  mouth daily before breakfast.   REFRESH LIQUIGEL OP Place 2 drops into both eyes in the morning, at noon, and at bedtime.   risperiDONE 1 MG tablet Commonly known as: RISPERDAL Take 1-1.5 mg by mouth 2 (two) times daily. Take 1 mg every morning and 1.5 mg at bedtime   traMADol 50 MG tablet Commonly known as: ULTRAM Take 1 tablet (50 mg total) by mouth every 12 (twelve) hours as needed for severe pain or moderate pain.   traZODone 50 MG tablet Commonly known as: DESYREL Take 50 mg by mouth at bedtime.        Allergies  Allergen Reactions   Methadone Other (See Comments)    Hallucinations and memory   Morphine And Related Other (See Comments)    Abnormal thinking and memory loss   Oxycodone Other (See Comments)    Hallucinations and memory   Lamotrigine Palpitations    You were cared for by a hospitalist during your hospital stay. If you have any questions about your discharge medications or the care you  received while you were in the hospital after you are discharged, you can call the unit and asked to speak with the hospitalist on call if the hospitalist that took care of you is not available. Once you are discharged, your primary care physician will handle any further medical issues. Please note that no refills for any discharge medications will be authorized once you are discharged, as it is imperative that you return to your primary care physician (or establish a relationship with a primary care physician if you do not have one) for your aftercare needs so that they can reassess your need for medications and monitor your lab values.   Procedures/Studies: CT HEAD WO CONTRAST (5MM)  Result Date: 11/16/2020 CLINICAL DATA:  Altered mental status EXAM: CT HEAD WITHOUT CONTRAST TECHNIQUE: Contiguous axial images were obtained from the base of the skull through the vertex without intravenous contrast. COMPARISON:  11/14/2020 FINDINGS: Brain: There is no evidence of recent bleeding within the cranium. There is no focal mass effect. There is no shift of midline structures. Cortical sulci are prominent. There is decreased density in the subcortical and periventricular white matter. Vascular: There are scattered arterial calcifications. Skull: No fracture is seen in the calvarium. There is interval decrease in the left periorbital contusion/hematoma in the scalp. Sinuses/Orbits: There is possible 1.4 x 0.7 cm polyp in the anterior left nasal cavity. Other: None IMPRESSION: No acute intracranial findings are seen. There are no signs of bleeding. Atrophy. Small-vessel disease. There is interval decrease in subcutaneous contusion/hematoma in the left periorbital region and left frontal scalp. There is possible polyp in the anterior left nasal cavity. Electronically Signed   By: Elmer Picker M.D.   On: 11/16/2020 12:06   CT HEAD WO CONTRAST (5MM)  Result Date: 11/14/2020 CLINICAL DATA:  Altered mental  status, fall EXAM: CT HEAD WITHOUT CONTRAST TECHNIQUE: Contiguous axial images were obtained from the base of the skull through the vertex without intravenous contrast. COMPARISON:  11/13/2020 FINDINGS: Brain: There is atrophy and chronic small vessel disease changes. No acute intracranial abnormality. Specifically, no hemorrhage, hydrocephalus, mass lesion, acute infarction, or significant intracranial injury. Vascular: No hyperdense vessel or unexpected calcification. Skull: No acute calvarial abnormality. Sinuses/Orbits: No acute findings Other: None IMPRESSION: Atrophy, chronic microvascular disease. No acute intracranial abnormality. Electronically Signed   By: Rolm Baptise M.D.   On: 11/14/2020 17:54   CT HEAD WO CONTRAST  Result Date: 11/13/2020 CLINICAL  DATA:  Fall from wheelchair. Head trauma, minor (Age >= 65y) EXAM: CT HEAD WITHOUT CONTRAST TECHNIQUE: Contiguous axial images were obtained from the base of the skull through the vertex without intravenous contrast. COMPARISON:  Head CT 05/25/2020 FINDINGS: Brain: Stable degree of atrophy and chronic small vessel ischemia. No intracranial hemorrhage, mass effect, or midline shift. No hydrocephalus. The basilar cisterns are patent. No evidence of territorial infarct or acute ischemia. No extra-axial or intracranial fluid collection. Vascular: Atherosclerosis of skullbase vasculature without hyperdense vessel or abnormal calcification. Skull: No fracture or focal lesion. Sinuses/Orbits: Mild mucosal thickening of ethmoid air cells. Bilateral cataract resection. Mastoid air cells are clear. Other: Left frontal scalp hematoma. IMPRESSION: 1. Left frontal scalp hematoma. No acute intracranial abnormality. No skull fracture. 2. Stable atrophy and chronic small vessel ischemia. Electronically Signed   By: Keith Rake M.D.   On: 11/13/2020 21:00   CT Angio Chest Pulmonary Embolism (PE) W or WO Contrast  Result Date: 11/15/2020 CLINICAL DATA:   Pulmonary embolism suspected EXAM: CT ANGIOGRAPHY CHEST WITH CONTRAST TECHNIQUE: Multidetector CT imaging of the chest was performed using the standard protocol during bolus administration of intravenous contrast. Multiplanar CT image reconstructions and MIPs were obtained to evaluate the vascular anatomy. CONTRAST:  1m OMNIPAQUE IOHEXOL 350 MG/ML SOLN COMPARISON:  None. FINDINGS: Cardiovascular: Satisfactory opacification of the pulmonary arteries to the segmental level. No evidence of pulmonary embolism. Normal heart size. No pericardial effusion. Aortic atherosclerosis. Dual-chamber pacer leads into the right heart. Mediastinum/Nodes: Negative for adenopathy or mass. Lungs/Pleura: Patchy ground-glass opacity in the right upper lobe. Mild atelectasis at the lung bases. Upper Abdomen: Marked atrophy of the left kidney with very poor enhancement. Atherosclerosis especially notable at the origin of the celiac. 7 mm splenic artery aneurysm. Musculoskeletal: Advanced and diffuse degenerative disease with spurring and endplate irregularity. 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 No acute osseous finding. Review of the MIP images confirms the above findings. IMPRESSION: 1. Mild right upper lobe pneumonia. 2. Negative for pulmonary embolism. 3.  Aortic Atherosclerosis (ICD10-I70.0). 4. Advanced left renal atrophy. Electronically Signed   By: JJorje GuildM.D.   On: 11/15/2020 05:00   ECHOCARDIOGRAM COMPLETE  Result Date: 11/17/2020    ECHOCARDIOGRAM REPORT   Patient Name:   KDARNELL STIMSONRUDD Date of Exam: 11/17/2020 Medical Rec #:  [id number]            Height:       62.0 in Accession #:    [id number]           Weight:       157.8 lb Date of Birth:  [birth year]            BSA:          1.729 m Patient Age:    89years              BP:           157/76 mmHg Patient Gender: F                     HR:           79 bpm. Exam Location:  [facility] Procedure: 2D Echo, Color Doppler and Cardiac Doppler Indications:     R94.31  Abnormal ECG; I05.9 Mitral Valve Disorder  History:         Patient has no prior history of Echocardiogram examinations.  CKD stage III; Risk Factors:Hypertension, Diabetes and Sleep                  Apnea.  Sonographer:     Charmayne Sheer Referring Phys:  [id number] [place] WHITE Diagnosing Phys: Yolonda Kida MD  Sonographer Comments: Image acquisition challenging due to uncooperative patient. IMPRESSIONS  1. Left ventricular ejection fraction, by estimation, is 60 to 65%. The left ventricle has normal function. The left ventricle has no regional wall motion abnormalities. Left ventricular diastolic parameters were normal.  2. Right ventricular systolic function is low normal. The right ventricular size is mildly enlarged. Mildly increased right ventricular wall thickness.  3. The mitral valve is normal in structure. No evidence of mitral valve regurgitation.  4. The aortic valve is grossly normal. Aortic valve regurgitation is not visualized. FINDINGS  Left Ventricle: Left ventricular ejection fraction, by estimation, is 60 to 65%. The left ventricle has normal function. The left ventricle has no regional wall motion abnormalities. The left ventricular internal cavity size was normal in size. There is  borderline concentric left ventricular hypertrophy. Left ventricular diastolic parameters were normal. Right Ventricle: The right ventricular size is mildly enlarged. Mildly increased right ventricular wall thickness. Right ventricular systolic function is low normal. Left Atrium: Left atrial size was normal in size. Right Atrium: Right atrial size was normal in size. Pericardium: There is no evidence of pericardial effusion. Mitral Valve: The mitral valve is normal in structure. There is mild thickening of the mitral valve leaflet(s). No evidence of mitral valve regurgitation. MV peak gradient, 10.6 mmHg. The mean mitral valve gradient is 3.0 mmHg. Tricuspid Valve: The tricuspid valve is normal in  structure. Tricuspid valve regurgitation is trivial. Aortic Valve: The aortic valve is grossly normal. Aortic valve regurgitation is not visualized. Aortic valve mean gradient measures 11.3 mmHg. Aortic valve peak gradient measures 19.0 mmHg. Aortic valve area, by VTI measures 0.99 cm. Pulmonic Valve: The pulmonic valve was normal in structure. Pulmonic valve regurgitation is not visualized. Aorta: The ascending aorta was not well visualized. IAS/Shunts: No atrial level shunt detected by color flow Doppler. Additional Comments: A device lead is visualized.  LEFT VENTRICLE PLAX 2D LVIDd:         3.70 cm LVIDs:         2.50 cm LV PW:         0.90 cm LV IVS:        0.90 cm LVOT diam:     1.60 cm LV SV:         41 LV SV Index:   24 LVOT Area:     2.01 cm  RIGHT VENTRICLE RV Basal diam:  3.10 cm LEFT ATRIUM           Index LA diam:      1.90 cm 1.10 cm/m LA Vol (A2C): 23.8 ml 13.77 ml/m LA Vol (A4C): 57.5 ml 33.26 ml/m  AORTIC VALVE                     PULMONIC VALVE AV Area (Vmax):    0.92 cm      PV Vmax:       0.90 m/s AV Area (Vmean):   0.89 cm      PV Vmean:      71.000 cm/s AV Area (VTI):     0.99 cm      PV VTI:        0.222 m AV Vmax:  218.00 cm/s   PV Peak grad:  3.3 mmHg AV Vmean:          155.333 cm/s  PV Mean grad:  2.0 mmHg AV VTI:            0.414 m AV Peak Grad:      19.0 mmHg AV Mean Grad:      11.3 mmHg LVOT Vmax:         100.00 cm/s LVOT Vmean:        69.100 cm/s LVOT VTI:          0.204 m LVOT/AV VTI ratio: 0.49  AORTA Ao Root diam: 2.10 cm MITRAL VALVE                TRICUSPID VALVE MV Area (PHT): 4.57 cm     TR Peak grad:   21.7 mmHg MV Area VTI:   1.30 cm     TR Vmax:        233.00 cm/s MV Peak grad:  10.6 mmHg MV Mean grad:  3.0 mmHg     SHUNTS MV Vmax:       1.63 m/s     Systemic VTI:  0.20 m MV Vmean:      80.3 cm/s    Systemic Diam: 1.60 cm MV Decel Time: 166 msec MV E velocity: 120.50 cm/s Yolonda Kida MD Electronically signed by Yolonda Kida MD Signature Date/Time:  11/17/2020/1:33:59 PM    Final    CT ANGIO GI BLEED  Result Date: 11/15/2020 CLINICAL DATA:  GI bleed, altered mental status EXAM: CTA ABDOMEN AND PELVIS WITHOUT AND WITH CONTRAST TECHNIQUE: Multidetector CT imaging of the abdomen and pelvis was performed using the standard protocol during bolus administration of intravenous contrast. Multiplanar reconstructed images and MIPs were obtained and reviewed to evaluate the vascular anatomy. CONTRAST:  118m OMNIPAQUE IOHEXOL 350 MG/ML SOLN COMPARISON:  CT pelvis dated 09/27/2019 FINDINGS: VASCULAR Aorta: No evidence abdominal aortic aneurysm or dissection. Patent. Atherosclerotic calcifications. Celiac: Patent. SMA: Patent. Renals: Patent on the right.  Patent but stenotic on the left. IMA: Patent. Inflow: Patent bilaterally.  Atherosclerotic calcifications. Proximal Outflow: Patent bilaterally. Atherosclerotic calcifications. Veins: Grossly unremarkable. No findings of active GI bleeding on CT. See below for pertinent GI findings. Review of the MIP images confirms the above findings. NON-VASCULAR Lower chest: Lung bases are clear. Hepatobiliary: Liver is within normal limits. Status post cholecystectomy. No intrahepatic ductal dilatation. Common duct measures 8 mm and smoothly tapers at the ampulla. Pancreas: Within normal limits. Spleen: Within normal limits. Adrenals/Urinary Tract: Adrenal glands are within normal limits. Severe left renal atrophy. 3.1 x 3.4 cm septated posterior right lower pole renal cyst (series 7/image 44). 1.9 cm anterior right upper pole renal cyst (series 7/image 34). No hydronephrosis. Bladder is within normal limits. Stomach/Bowel: Stomach is notable for a tiny hiatal hernia. No evidence of bowel obstruction. Appendix is not discretely visualized. Sigmoid diverticulosis. Long segment rectosigmoid wall thickening, with associated pericolonic inflammatory changes, suggesting infectious/inflammatory proctocolitis. Masslike thickening  involving the low rectum with associated mucosal enhancement (series 7/image 83). This is favored to be related to the patient's colitis, but an underlying rectal neoplasm is difficult to entirely exclude. However, this region left normal on prior pelvic CT. As noted above, there is no evidence of active GI bleeding on CT, including in this location. Lymphatic: No suspicious abdominopelvic lymphadenopathy. Reproductive: Uterus is within normal limits. Bilateral ovaries are within normal limits. Other: No abdominopelvic ascites. Mild presacral fluid/stranding (series 7/image  70). Musculoskeletal: Status post L2-S1 PLIF. Degenerative changes of the visualized thoracolumbar spine. IMPRESSION: Long segment wall thickening/inflammatory changes involving the rectosigmoid colon, suggesting infectious/inflammatory proctocolitis. Focal wall thickening with mucosal enhancement involving the low rectum, favored to be related to the inflammatory process, although underlying rectal neoplasm is difficult to entirely exclude. Follow-up flexible sigmoidoscopy is suggested. No evidence of active GI bleeding on CT, including in this location. Electronically Signed   By: Julian Hy M.D.   On: 11/15/2020 00:26     The results of significant diagnostics from this hospitalization (including imaging, microbiology, ancillary and laboratory) are listed below for reference.     Microbiology: Recent Results (from the past 240 hour(s))  CULTURE, BLOOD (ROUTINE X 2) w Reflex to ID Panel     Status: None   Collection Time: 11/14/20 10:55 PM   Specimen: BLOOD  Result Value Ref Range Status   Specimen Description BLOOD RA  Final   Special Requests   Final    BOTTLES DRAWN AEROBIC AND ANAEROBIC Blood Culture adequate volume   Culture   Final    NO GROWTH 5 DAYS Performed at [facility], [address] [place], [place] [address]    Report Status 11/19/2020 FINAL  Final  Urine Culture     Status: Abnormal    Collection Time: 11/15/20 12:36 AM   Specimen: In/Out Cath Urine  Result Value Ref Range Status   Specimen Description   Final    IN/OUT CATH URINE Performed at [facility], [address] [place], [place] [address]    Special Requests   Final    NONE Performed at [facility], [address] [place], [place] [address]    Culture MULTIPLE SPECIES PRESENT, SUGGEST RECOLLECTION (A)  Final   Report Status 11/16/2020 FINAL  Final  Gastrointestinal Panel by PCR , Stool     Status: None   Collection Time: 11/15/20  6:36 AM   Specimen: Nasal Mucosa; Stool  Result Value Ref Range Status   Campylobacter species NOT DETECTED NOT DETECTED Final   Plesimonas shigelloides NOT DETECTED NOT DETECTED Final   Salmonella species NOT DETECTED NOT DETECTED Final   Yersinia enterocolitica NOT DETECTED NOT DETECTED Final   Vibrio species NOT DETECTED NOT DETECTED Final   Vibrio cholerae NOT DETECTED NOT DETECTED Final   Enteroaggregative E coli (EAEC) NOT DETECTED NOT DETECTED Final   Enteropathogenic E coli (EPEC) NOT DETECTED NOT DETECTED Final   Enterotoxigenic E coli (ETEC) NOT DETECTED NOT DETECTED Final   Shiga like toxin producing E coli (STEC) NOT DETECTED NOT DETECTED Final   Shigella/Enteroinvasive E coli (EIEC) NOT DETECTED NOT DETECTED Final   Cryptosporidium NOT DETECTED NOT DETECTED Final   Cyclospora cayetanensis NOT DETECTED NOT DETECTED Final   Entamoeba histolytica NOT DETECTED NOT DETECTED Final   Giardia lamblia NOT DETECTED NOT DETECTED Final   Adenovirus F40/41 NOT DETECTED NOT DETECTED Final   Astrovirus NOT DETECTED NOT DETECTED Final   Norovirus GI/GII NOT DETECTED NOT DETECTED Final   Rotavirus A NOT DETECTED NOT DETECTED Final   Sapovirus (I, II, IV, and V) NOT DETECTED NOT DETECTED Final    Comment: Performed at [facility], [address] [place], [place] [address]  MRSA Next Gen by PCR, Nasal     Status: None   Collection Time:  11/15/20  6:36 AM   Specimen: Nasal Mucosa; Nasal Swab  Result Value Ref Range Status   MRSA by PCR Next Gen NOT DETECTED  NOT DETECTED Final    Comment: (NOTE) The GeneXpert MRSA Assay (FDA approved for NASAL specimens only), is one component of a comprehensive MRSA colonization surveillance program. It is not intended to diagnose MRSA infection nor to guide or monitor treatment for MRSA infections. Test performance is not FDA approved in patients less than 72 years old. Performed at [facility], [place], [place] [address]   C Difficile Quick Screen w PCR reflex     Status: None   Collection Time: 11/15/20  2:00 PM   Specimen: STOOL  Result Value Ref Range Status   C Diff antigen NEGATIVE NEGATIVE Final   C Diff toxin NEGATIVE NEGATIVE Final   C Diff interpretation No C. difficile detected.  Final    Comment: Performed at [facility], [address] [place], [place] [address]  Culture, blood (Routine X 2) w Reflex to ID Panel     Status: None (Preliminary result)   Collection Time: 11/15/20  2:22 PM   Specimen: BLOOD  Result Value Ref Range Status   Specimen Description BLOOD BLOOD LEFT HAND  Final   Special Requests   Final    BOTTLES DRAWN AEROBIC AND ANAEROBIC Blood Culture results may not be optimal due to an inadequate volume of blood received in culture bottles   Culture   Final    NO GROWTH 4 DAYS Performed at [facility], [address] [place], [place] [address]    Report Status PENDING  Incomplete     Labs: BNP (last 3 results) No results for input(s): BNP in the last 8760 hours. Basic Metabolic Panel: Recent Labs  Lab 11/15/20 0611 11/16/20 0446 11/17/20 0526 11/18/20 0838 11/19/20 0747  NA 139 139 139 139 140  K 4.1 3.6 3.4* 3.1* 3.8  CL 102 104 103 99 104  CO2 _0 GLUCOSE 162* 131* 122* 141* 127*  BUN 25* 31* 28* 17 20  CREATININE 1.16* 1.47* 1.23* 1.06* 1.08*  CALCIUM 8.9 8.6* 8.6* 8.5*  8.8*  MG  --  1.7 1.6* 1.4* 2.0  PHOS  --  3.8 2.5  --   --    Liver Function Tests: Recent Labs  Lab 11/14/20 1811 11/17/20 0526  AST 26 19  ALT 20 14  ALKPHOS 95 70  BILITOT 1.0 0.7  PROT 7.1 6.4*  ALBUMIN 3.6 2.9*   No results for input(s): LIPASE, AMYLASE in the last 168 hours. Recent Labs  Lab 11/14/20 2255  AMMONIA 14   CBC: Recent Labs  Lab 11/14/20 1811 11/15/20 0611 11/16/20 0446 11/17/20 0526 11/19/20 0747  WBC 16.6* 16.1* 14.7* 12.3* 14.1*  NEUTROABS 14.9*  --   --   --   --   HGB 14.4 14.6 12.2 11.1* 12.5  HCT 44.9 44.1 36.4 34.2* 37.9  MCV 93.9 92.8 93.3 91.7 92.0  PLT 223 250 219 221 313   Cardiac Enzymes: Recent Labs  Lab 11/15/20 0330  CKTOTAL 54   BNP: Invalid input(s): POCBNP CBG: Recent Labs  Lab 11/18/20 2017 11/18/20 2330 11/19/20 0410 11/19/20 0848 11/19/20 1151  GLUCAP 142* 170* 111* 145* 191*   D-Dimer No results for input(s): DDIMER in the last 72 hours. Hgb A1c No results for input(s): HGBA1C in the last 72 hours. Lipid Profile No results for input(s): CHOL, HDL, LDLCALC, TRIG, CHOLHDL, LDLDIRECT in the last 72 hours. Thyroid function studies No results for input(s): TSH, T4TOTAL, T3FREE, THYROIDAB in the last 72 hours.  Invalid input(s): FREET3 Anemia work up No results for input(s): VITAMINB12, FOLATE, FERRITIN, TIBC, IRON, RETICCTPCT in the last 72 hours. Urinalysis    Component Value Date/Time   COLORURINE YELLOW (A) 11/14/2020 1759   APPEARANCEUR CLEAR (A) 11/14/2020 1759   LABSPEC 1.023 11/14/2020 1759   PHURINE 5.0 11/14/2020 1759   GLUCOSEU NEGATIVE 11/14/2020 1759   HGBUR NEGATIVE 11/14/2020 1759   BILIRUBINUR NEGATIVE 11/14/2020 1759   KETONESUR NEGATIVE 11/14/2020 1759   PROTEINUR 30 (A) 11/14/2020 1759   NITRITE NEGATIVE 11/14/2020 1759   LEUKOCYTESUR NEGATIVE 11/14/2020 1759   Sepsis Labs Invalid input(s): PROCALCITONIN,  WBC,  LACTICIDVEN Microbiology Recent Results (from the past 240 hour(s))   CULTURE, BLOOD (ROUTINE X 2) w Reflex to ID Panel     Status: None   Collection Time: 11/14/20 10:55 PM   Specimen: BLOOD  Result Value Ref Range Status   Specimen Description BLOOD RA  Final   Special Requests   Final    BOTTLES DRAWN AEROBIC AND ANAEROBIC Blood Culture adequate volume   Culture   Final    NO GROWTH 5 DAYS Performed at [facility], [address] [place], [place] [address]    Report Status 11/19/2020 FINAL  Final  Urine Culture     Status: Abnormal   Collection Time: 11/15/20 12:36 AM   Specimen: In/Out Cath Urine  Result Value Ref Range Status   Specimen Description   Final    IN/OUT CATH URINE Performed at [facility], [address] [place], [place] [address]    Special Requests   Final    NONE Performed at [facility], [address] [place], [place] [address]    Culture MULTIPLE SPECIES PRESENT, SUGGEST RECOLLECTION (A)  Final   Report Status 11/16/2020 FINAL  Final  Gastrointestinal Panel by PCR , Stool     Status: None   Collection Time: 11/15/20  6:36 AM   Specimen: Nasal Mucosa; Stool  Result Value Ref Range Status   Campylobacter species NOT DETECTED NOT DETECTED Final   Plesimonas shigelloides NOT DETECTED NOT DETECTED Final   Salmonella species NOT DETECTED NOT DETECTED Final   Yersinia enterocolitica NOT DETECTED NOT DETECTED Final   Vibrio species NOT DETECTED NOT DETECTED Final   Vibrio cholerae NOT DETECTED NOT DETECTED Final   Enteroaggregative E coli (EAEC) NOT DETECTED NOT DETECTED Final   Enteropathogenic E coli (EPEC) NOT DETECTED NOT DETECTED Final   Enterotoxigenic E coli (ETEC) NOT DETECTED NOT DETECTED Final   Shiga like toxin producing E coli (STEC) NOT DETECTED NOT DETECTED Final   Shigella/Enteroinvasive E coli (EIEC) NOT DETECTED NOT DETECTED Final   Cryptosporidium NOT DETECTED NOT DETECTED Final   Cyclospora cayetanensis NOT DETECTED NOT DETECTED Final   Entamoeba histolytica NOT DETECTED  NOT DETECTED Final   Giardia lamblia NOT DETECTED NOT DETECTED Final   Adenovirus F40/41 NOT DETECTED NOT DETECTED Final   Astrovirus NOT DETECTED NOT DETECTED Final   Norovirus GI/GII NOT DETECTED NOT DETECTED Final   Rotavirus A NOT DETECTED NOT DETECTED Final   Sapovirus (I, II, IV, and V) NOT DETECTED NOT DETECTED Final    Comment: Performed at [facility], [address] [place], [place] [address]  MRSA Next Gen by PCR, Nasal     Status: None   Collection Time: 11/15/20  6:36 AM   Specimen: Nasal Mucosa; Nasal Swab  Result Value Ref Range Status   MRSA by PCR Next Gen NOT DETECTED NOT DETECTED Final  Comment: (NOTE) The GeneXpert MRSA Assay (FDA approved for NASAL specimens only), is one component of a comprehensive MRSA colonization surveillance program. It is not intended to diagnose MRSA infection nor to guide or monitor treatment for MRSA infections. Test performance is not FDA approved in patients less than 85 years old. Performed at [facility], [place], [place] [address]   C Difficile Quick Screen w PCR reflex     Status: None   Collection Time: 11/15/20  2:00 PM   Specimen: STOOL  Result Value Ref Range Status   C Diff antigen NEGATIVE NEGATIVE Final   C Diff toxin NEGATIVE NEGATIVE Final   C Diff interpretation No C. difficile detected.  Final    Comment: Performed at [facility], [address] [place], [place] [address]  Culture, blood (Routine X 2) w Reflex to ID Panel     Status: None (Preliminary result)   Collection Time: 11/15/20  2:22 PM   Specimen: BLOOD  Result Value Ref Range Status   Specimen Description BLOOD BLOOD LEFT HAND  Final   Special Requests   Final    BOTTLES DRAWN AEROBIC AND ANAEROBIC Blood Culture results may not be optimal due to an inadequate volume of blood received in culture bottles   Culture   Final    NO GROWTH 4 DAYS Performed at [facility], [address],  [place],  [address]    Report Status PENDING  Incomplete     Time coordinating discharge:  I have spent 35 minutes face to face with the patient and on the ward discussing the patients care, assessment, plan and disposition with other care givers. >50% of the time was devoted counseling the patient about the risks and benefits of treatment/Discharge disposition and coordinating care.   SIGNED:   Damita Lack, MD  [facility] 11/19/2020, 2:01 PM   If 7PM-7AM, please contact night-coverage

## 2020-11-19 NOTE — Progress Notes (Signed)
EMS here to pick up the patient. IV removed. Report called to Marketing executive at [employer]

## 2020-11-19 NOTE — TOC Progression Note (Signed)
Transition of Care [facility]) - Progression Note    Patient Details  Name: Chelsea Ruiz MRN: [id number] Date of Birth: 06/10/1940  [facility]) CM/SW Contact  Margarito Liner, LCSW Phone Number: 11/19/2020, 9:27 AM  Clinical Narrative:   Daughter prefers [facility] and they have made a bed offer. PASARR obtained:  [phone] [id number]. Expires 12/18.  Expected Discharge Plan: Memory Care Barriers to Discharge: Continued Medical Work up  Expected Discharge Plan and Services Expected Discharge Plan: Memory Care   Discharge Planning Services: CM Consult   Living arrangements for the past 2 months:  (memory care)                                       Social Determinants of Health (SDOH) Interventions    Readmission Risk Interventions Readmission Risk Prevention Plan 11/17/2020  Transportation Screening Complete  Social Work Consult for Recovery Care Planning/Counseling Complete  Palliative Care Screening Complete  Medication Review Oceanographer) Complete  Some recent data might be hidden

## 2020-11-19 NOTE — Progress Notes (Signed)
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 The patient's initial ECG was abnormal and notable for a RBBB thus cardiology was consulted. The patient's ECG revealed atrial-sensed ventricular pace rhythm with RBBB and appears similar to that of her previous ECG's from 2021 and 2020.   SUBJECTIVE: The patient answers to her name and states that she "is hot" in reference to temperature. She denies having any pain at this time, but continues to answer most questions inappropriately.  The patient's family member is at the bedside and reports that the patient has not had any complaints since she has been at the bedside.   (11/17/20): The patient answerers questions inappropriately, but does state that she is hungry. (Patient is currently NPO for GI procedure)  (11/16/20): UTA. The patient answerers questions inappropriately  Vitals:   11/18/20 1542 11/18/20 1939 11/19/20 0419 11/19/20 0815  BP: (!) 153/71 (!) 176/85 (!) 168/84 (!) 152/39  Pulse: 65 64 68 65  Resp: 17 18 18 17   Temp: 97.7 F (36.5 C) (!) 97.5 F (36.4 C) 97.8 F (36.6 C) 97.6 F (36.4 C)  TempSrc: Oral Oral Oral   SpO2: 98% 98% 97% 100%  Weight:      Height:         Intake/Output Summary (Last 24 hours) at 11/19/2020 1614 Last data filed at 11/19/2020 1353 Gross per 24 hour  Intake 1043.87 ml  Output 800 ml  Net 243.87 ml      PHYSICAL EXAM General: alert to self, disoriented x3, well nourished, in no acute distress HEENT:  Normocephalic and left frontal hematoma. PERRL Neck:   No JVD.  Lungs: Clear bilaterally to auscultation. Chest expansion symmetrical. normal effort  of breathing.  Heart: HRRR . Normal S1 and S2 with grade 2/6 murmur. No gallops.  Abdomen: Bowel sounds are positive, abdomen soft and non-tender  Msk:  decreased strength and tone for age. Extremities: moderate, nonpitting LLE edema. No clubbing or cyanosis. Neuro: disoriented X 3. Able to move all four limbs, 4/5 strength in all extremities  Psych:  responds inappropriately   LABS: Basic Metabolic Panel: Recent Labs    11/17/20 0526 11/18/20 0838 11/19/20 0747  NA 139 139 140  K 3.4* 3.1* 3.8  CL 103 99 104  CO2 27 26 27   GLUCOSE 122* 141* 127*  BUN 28* 17 20  CREATININE 1.23* 1.06* 1.08*  CALCIUM 8.6* 8.5* 8.8*  MG 1.6* 1.4* 2.0  PHOS 2.5  --   --    Liver Function Tests: Recent Labs    11/17/20 0526  AST 19  ALT 14  ALKPHOS 70  BILITOT 0.7  PROT 6.4*  ALBUMIN 2.9*   No results for input(s): LIPASE, AMYLASE in the last 72 hours. CBC: Recent Labs    11/17/20 0526 11/19/20 0747  WBC 12.3* 14.1*  HGB 11.1* 12.5  HCT 34.2* 37.9  MCV 91.7 92.0  PLT 221 313   Cardiac Enzymes: No results for input(s): CKTOTAL, CKMB, CKMBINDEX, TROPONINI in the last 72 hours.  BNP: Invalid input(s): POCBNP D-Dimer: No results for input(s): DDIMER in the last 72 hours.  Hemoglobin A1C: No results for input(s): HGBA1C in the last 72 hours.  Fasting Lipid Panel:  No results for input(s): CHOL, HDL, LDLCALC, TRIG, CHOLHDL, LDLDIRECT in the last 72 hours. Thyroid Function Tests: No results for input(s): TSH, T4TOTAL, T3FREE, THYROIDAB in the last 72 hours.  Invalid input(s): FREET3  Anemia Panel: No results for input(s): VITAMINB12, FOLATE, FERRITIN, TIBC, IRON, RETICCTPCT in the last 72 hours.  No results found.   Echo: IMPRESSIONS   1. Left ventricular ejection fraction, by estimation, is 60 to 65%. The  left ventricle has normal function. The left ventricle has no regional  wall motion abnormalities. Left ventricular diastolic parameters were  normal.   2. Right  ventricular systolic function is low normal. The right  ventricular size is mildly enlarged. Mildly increased right ventricular  wall thickness.   3. The mitral valve is normal in structure. No evidence of mitral valve  regurgitation.   4. The aortic valve is grossly normal. Aortic valve regurgitation is not  visualized.   TELEMETRY: atrial sensed, ventricular paced rhythm with RBBB and HR of 65 bpm.   ASSESSMENT AND PLAN:  Principal Problem:   AMS (altered mental status) Active Problems:   Chronic kidney disease (CKD), stage III (moderate) (HCC)   Essential hypertension   Memory loss   Type 2 diabetes mellitus with stage 3 chronic kidney disease, without long-term current use of insulin (HCC)   Hypomagnesemia   GI bleed    Atrial-sensed Ventricular Paced rhythm with RBBB # Tachycardia # Mobitz type I and type II s/p PPM (11/2016) ECG remarkable for atrial sensed ventricular paced rhythm with a right bundle branch block and a heart rate of 104 bpm which is consistent with previous ECG's from 2021 and 2020. The patient appears to be asymptomatic and the high sensitive troponins were negative x4. Tachycardia has now resolved.  The echocardiogram revealed normal LV systolic function with estimated EF of 60 to 65%, no regional wall motion abnormalities, low normal RV systolic function and mildly enlarged RV wall thickness.             - Continuous cardiac monitoring until discharge.             - Continue metoprolol tartrate.   # Electrolyte imbalance Potassium 3.8 and magnesium 2.0 on today.  -Replete electrolytes as needed per protocol.              # HTN Fairly controlled.   -Continue beta-blocker as above and consider increasing dosage to help with bp management.              - continue hydralazine IV PRN systolic bp >160.             - continue to Hold losartan at this time in the presence of acute on chronic kidney disease.    # HLD # DM Type II             -Continue  atorvastatin therapy.             -Agree with current sliding scale insulin per protocol.  # Ischemic colitis             -Management per GI.  # acute on chronic CKD              - Recommend holding ARB as above.  - trend BMP.  # OSA             -Recommend nightly use of CPAP machine is indicated per RT.    #  AMS with baseline Dementia  # Acute metabolic encephalopathy  #  Sepsis  # Right upper lobe Pneumonia             - Agree with current management.              - Agree with antibiotic therapy.   [place], ACNPC-AG  11/19/2020 4:14 PM

## 2020-11-20 LAB — CULTURE, BLOOD (ROUTINE X 2): Culture: NO GROWTH

## 2021-09-03 ENCOUNTER — Emergency Department: Payer: Medicare Other

## 2021-09-03 ENCOUNTER — Emergency Department
Admission: EM | Admit: 2021-09-03 | Discharge: 2021-09-04 | Disposition: A | Discharge status: Skilled Nursing Facility | Payer: Medicare Other | Attending: Emergency Medicine | Admitting: Emergency Medicine

## 2021-09-03 ENCOUNTER — Other Ambulatory Visit: Payer: Self-pay

## 2021-09-03 DIAGNOSIS — N189 Chronic kidney disease, unspecified: Secondary | ICD-10-CM | POA: Insufficient documentation

## 2021-09-03 DIAGNOSIS — W050XXA Fall from non-moving wheelchair, initial encounter: Secondary | ICD-10-CM | POA: Insufficient documentation

## 2021-09-03 DIAGNOSIS — S0083XA Contusion of other part of head, initial encounter: Secondary | ICD-10-CM | POA: Insufficient documentation

## 2021-09-03 DIAGNOSIS — R7989 Other specified abnormal findings of blood chemistry: Secondary | ICD-10-CM | POA: Insufficient documentation

## 2021-09-03 DIAGNOSIS — I129 Hypertensive chronic kidney disease with stage 1 through stage 4 chronic kidney disease, or unspecified chronic kidney disease: Secondary | ICD-10-CM | POA: Diagnosis not present

## 2021-09-03 DIAGNOSIS — Y92129 Unspecified place in nursing home as the place of occurrence of the external cause: Secondary | ICD-10-CM | POA: Insufficient documentation

## 2021-09-03 DIAGNOSIS — S0990XA Unspecified injury of head, initial encounter: Secondary | ICD-10-CM

## 2021-09-03 DIAGNOSIS — F0390 Unspecified dementia without behavioral disturbance: Secondary | ICD-10-CM | POA: Insufficient documentation

## 2021-09-03 DIAGNOSIS — W19XXXA Unspecified fall, initial encounter: Secondary | ICD-10-CM

## 2021-09-03 DIAGNOSIS — N390 Urinary tract infection, site not specified: Secondary | ICD-10-CM | POA: Diagnosis not present

## 2021-09-03 DIAGNOSIS — E1122 Type 2 diabetes mellitus with diabetic chronic kidney disease: Secondary | ICD-10-CM | POA: Insufficient documentation

## 2021-09-03 DIAGNOSIS — R600 Localized edema: Secondary | ICD-10-CM | POA: Insufficient documentation

## 2021-09-03 DIAGNOSIS — Z23 Encounter for immunization: Secondary | ICD-10-CM | POA: Insufficient documentation

## 2021-09-03 DIAGNOSIS — L0390 Cellulitis, unspecified: Secondary | ICD-10-CM

## 2021-09-03 LAB — CBC WITH DIFFERENTIAL/PLATELET
Abs Immature Granulocytes: 0.03 10*3/uL (ref 0.00–0.07)
Basophils Absolute: 0.1 10*3/uL (ref 0.0–0.1)
Basophils Relative: 1 %
Eosinophils Absolute: 0.1 10*3/uL (ref 0.0–0.5)
Eosinophils Relative: 1 %
HCT: 37.4 % (ref 36.0–46.0)
Hemoglobin: 11.6 g/dL — ABNORMAL LOW (ref 12.0–15.0)
Immature Granulocytes: 0 %
Lymphocytes Relative: 20 %
Lymphs Abs: 1.9 10*3/uL (ref 0.7–4.0)
MCH: 29.2 pg (ref 26.0–34.0)
MCHC: 31 g/dL (ref 30.0–36.0)
MCV: 94.2 fL (ref 80.0–100.0)
Monocytes Absolute: 0.7 10*3/uL (ref 0.1–1.0)
Monocytes Relative: 7 %
Neutro Abs: 6.9 10*3/uL (ref 1.7–7.7)
Neutrophils Relative %: 71 %
Platelets: 231 10*3/uL (ref 150–400)
RBC: 3.97 MIL/uL (ref 3.87–5.11)
RDW: 13.9 % (ref 11.5–15.5)
WBC: 9.7 10*3/uL (ref 4.0–10.5)
nRBC: 0 % (ref 0.0–0.2)

## 2021-09-03 LAB — COMPREHENSIVE METABOLIC PANEL
ALT: 20 U/L (ref 0–44)
AST: 23 U/L (ref 15–41)
Albumin: 3.2 g/dL — ABNORMAL LOW (ref 3.5–5.0)
Alkaline Phosphatase: 72 U/L (ref 38–126)
Anion gap: 10 (ref 5–15)
BUN: 27 mg/dL — ABNORMAL HIGH (ref 8–23)
CO2: 26 mmol/L (ref 22–32)
Calcium: 8.7 mg/dL — ABNORMAL LOW (ref 8.9–10.3)
Chloride: 103 mmol/L (ref 98–111)
Creatinine, Ser: 1.25 mg/dL — ABNORMAL HIGH (ref 0.44–1.00)
GFR, Estimated: 44 mL/min — ABNORMAL LOW (ref >60)
Glucose, Bld: 162 mg/dL — ABNORMAL HIGH (ref 70–99)
Potassium: 4.7 mmol/L (ref 3.5–5.1)
Sodium: 139 mmol/L (ref 135–145)
Total Bilirubin: 0.8 mg/dL (ref 0.3–1.2)
Total Protein: 6 g/dL — ABNORMAL LOW (ref 6.5–8.1)

## 2021-09-03 LAB — LACTIC ACID, PLASMA: Lactic Acid, Venous: 2.4 mmol/L (ref 0.5–1.9)

## 2021-09-03 LAB — TROPONIN I (HIGH SENSITIVITY): Troponin I (High Sensitivity): 6 ng/L (ref <18)

## 2021-09-03 MED ORDER — SODIUM CHLORIDE 0.9 % IV SOLN
2.0000 g | Freq: Once | INTRAVENOUS | Status: AC
  Administered 2021-09-04: 2 g via INTRAVENOUS
  Filled 2021-09-03: qty 20

## 2021-09-03 NOTE — ED Triage Notes (Signed)
Patient BIB EMS for evaluation after fall.  Reports fall from wheelchair.  Skin tear to R elbow and hematoma to R forehead.  No reports of LOC.  Patient currently at baseline mental status.

## 2021-09-03 NOTE — ED Provider Notes (Signed)
   [facility] Provider Note    Event Date/Time   First MD Initiated Contact with Patient 09/03/21 2113     (approximate)   History   Fall   HPI  Chelsea Ruiz is a 81 y.o. female  ***       Physical Exam   Triage Vital Signs: ED Triage Vitals  Enc Vitals Group     BP      Pulse      Resp      Temp      Temp src      SpO2      Weight      Height      Head Circumference      Peak Flow      Pain Score      Pain Loc      Pain Edu?      Excl. in GC?     Most recent vital signs: There were no vitals filed for this visit.   General: Awake, no distress. *** CV:  Good peripheral perfusion. *** Resp:  Normal effort. *** Abd:  No distention. *** Other:  ***   ED Results / Procedures / Treatments   Labs (all labs ordered are listed, but only abnormal results are displayed) Labs Reviewed - No data to display   EKG ***   RADIOLOGY ***   ***I also independently reviewed and agree with radiologist interpretations.   PROCEDURES:  Critical Care performed: {CriticalCareYesNo:19197::"Yes, see critical care procedure note(s)","No"}  ***   MEDICATIONS ORDERED IN ED: Medications - No data to display   IMPRESSION / MDM / ASSESSMENT AND PLAN / ED COURSE  I reviewed the triage vital signs and the nursing notes.                               ***The patient is on the cardiac monitor to evaluate for evidence of arrhythmia and/or significant heart rate changes.   Ddx:  Differential includes the following, with pertinent life- or limb-threatening emergencies considered:  ***  Patient's presentation is most consistent with {EM COPA:27473}  MDM:  ***   MEDICATIONS GIVEN IN ED: Medications - No data to display   Consults:  ***   EMR reviewed  ***     FINAL CLINICAL IMPRESSION(S) / ED DIAGNOSES   Final diagnoses:  None     Rx / DC Orders   ED Discharge Orders     None        Note:  This  document was prepared using Dragon voice recognition software and may include unintentional dictation errors.

## 2021-09-04 DIAGNOSIS — S0083XA Contusion of other part of head, initial encounter: Secondary | ICD-10-CM | POA: Diagnosis not present

## 2021-09-04 LAB — URINALYSIS, ROUTINE W REFLEX MICROSCOPIC
Bilirubin Urine: NEGATIVE
Glucose, UA: NEGATIVE mg/dL
Hgb urine dipstick: NEGATIVE
Ketones, ur: NEGATIVE mg/dL
Nitrite: POSITIVE — AB
Protein, ur: NEGATIVE mg/dL
Specific Gravity, Urine: 1.015 (ref 1.005–1.030)
WBC, UA: >50 WBC/hpf — ABNORMAL HIGH (ref 0–5)
pH: 5 (ref 5.0–8.0)

## 2021-09-04 LAB — LACTIC ACID, PLASMA: Lactic Acid, Venous: 1.3 mmol/L (ref 0.5–1.9)

## 2021-09-04 LAB — PROCALCITONIN: Procalcitonin: 0.17 ng/mL

## 2021-09-04 LAB — TROPONIN I (HIGH SENSITIVITY): Troponin I (High Sensitivity): 9 ng/L (ref <18)

## 2021-09-04 MED ORDER — CEFDINIR 300 MG PO CAPS: 300.0000 mg | ORAL_CAPSULE | Freq: Two times a day (BID) | ORAL | 0 refills | Status: DC

## 2021-09-04 MED ORDER — SODIUM CHLORIDE 0.9 % IV BOLUS
1000.0000 mL | Freq: Once | INTRAVENOUS | Status: AC
Start: 2021-09-04 — End: 2021-09-04
  Administered 2021-09-04: 1000 mL via INTRAVENOUS

## 2021-09-04 MED ORDER — SODIUM CHLORIDE 0.9 % IV BOLUS: 500.0000 mL | Freq: Once | INTRAVENOUS | Status: DC

## 2021-09-04 MED ORDER — TETANUS-DIPHTH-ACELL PERTUSSIS 5-2.5-18.5 LF-MCG/0.5 IM SUSY
0.5000 mL | PREFILLED_SYRINGE | Freq: Once | INTRAMUSCULAR | Status: AC
Start: 2021-09-04 — End: 2021-09-04
  Administered 2021-09-04: 0.5 mL via INTRAMUSCULAR
  Filled 2021-09-04: qty 0.5

## 2021-09-04 NOTE — ED Notes (Signed)
Lab called and made aware of need for repeat lactic acid.  Multiple unsuccessful attempts made

## 2021-09-04 NOTE — ED Provider Notes (Signed)
-----------------------------------------   7:58 AM on 09/04/2021 -----------------------------------------  EMS is here to pick up the patient and saw a possible ST elevation on the monitor so an EKG was obtained.  The machine is reading the EKG as acute MI, however it shows a left bundle type pattern and small pacer spikes consistent with a ventricular paced rhythm.  I reviewed the patient's EKG from earlier today and it appears identical.  I compared with an EKG from November of last year and there were no significant changes.  EKG today does not meet Sgarbossa criteria.  The patient has already had 2 negative troponins during this visit.  She has no acute symptoms.  There is no evidence of acute MI.  She is stable for discharge.  ED ECG REPORT I, Dionne Bucy, the attending physician, personally viewed and interpreted this ECG.  Date: 09/04/2021 EKG Time: 0751 Rate: 76 Rhythm: Ventricular paced rhythm QRS Axis: normal Intervals: IVCD ST/T Wave abnormalities: LVH with repolarization abnormality Narrative Interpretation: no evidence of acute ischemia; no significant change when compared to EKG of 00 50 today     Dionne Bucy, MD 09/04/21 6433

## 2021-09-04 NOTE — ED Provider Notes (Signed)
Assumed care of patient at shift change.  I was signed out the patient needed a urinalysis and a repeat lactic after IV fluids.  Urine appears infected.  We will add on culture.  She is already received Rocephin for possible cellulitis.  She has had 1 documented low blood pressure but nurse reports this was erroneous and immediately rechecked and was normal.  Repeat lactic is now down to 1.3.  Will discharge with cefdinir to cover patient's UTI and nonpurulent cellulitis.  Will discharge back to her nursing facility.   At this time, I do not feel there is any life-threatening condition present. I reviewed all nursing notes, vitals, pertinent previous records.  All lab and urine results, EKGs, imaging ordered have been independently reviewed and interpreted by myself.  I reviewed all available radiology reports from any imaging ordered this visit.  Based on my assessment, I feel the patient is safe to be discharged home without further emergent workup and can continue workup as an outpatient as needed. Discussed all findings, treatment plan as well as usual and customary return precautions.  They verbalize understanding and are comfortable with this plan.  Outpatient follow-up has been provided as needed.  All questions have been answered.    Teasia Zapf, Layla Maw, DO 09/04/21 [phone]

## 2021-09-04 NOTE — ED Notes (Signed)
Lab called and made aware of need for urine culture 

## 2021-09-04 NOTE — ED Notes (Signed)
[facility] called for transport back to [facility]

## 2021-09-06 LAB — URINE CULTURE: Culture: >=100000 — AB

## 2021-09-20 IMAGING — CT CT HEAD W/O CM
4 series · 16 of 47 positions shown, 18 images · non-contrast
Comparison: Prior head CT examinations 05/18/2020 and earlier.

CLINICAL DATA: Head trauma, minor. Additional history provided:
Fall.

EXAM:
CT HEAD WITHOUT CONTRAST
TECHNIQUE: Contiguous axial images were obtained from the base of the skull
through the vertex without intravenous contrast.

[Series 2: head bone · axial · 0.43mm/px · z∈[-73,-45]mm · 3 of 73 slices shown]
[im 8/73  bone]
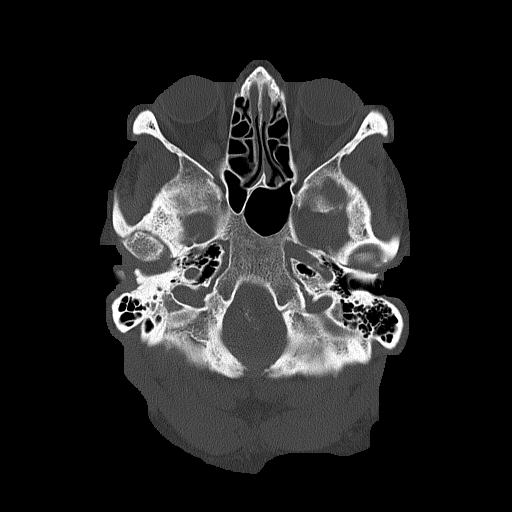
[im 15/73  bone]
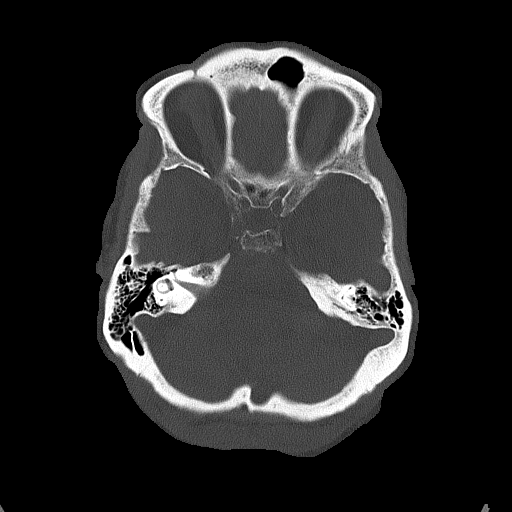
[im 22/73  bone]
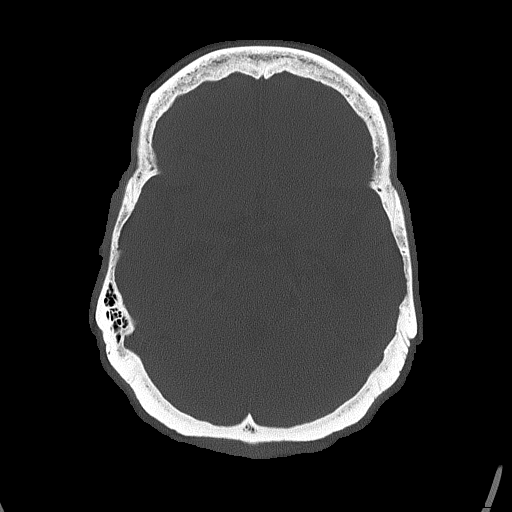

[Series 3: head wo · axial · 0.43mm/px · z∈[-72,+33]mm · 7 of 29 slices shown, 9 images]
[im 4/29  brain]
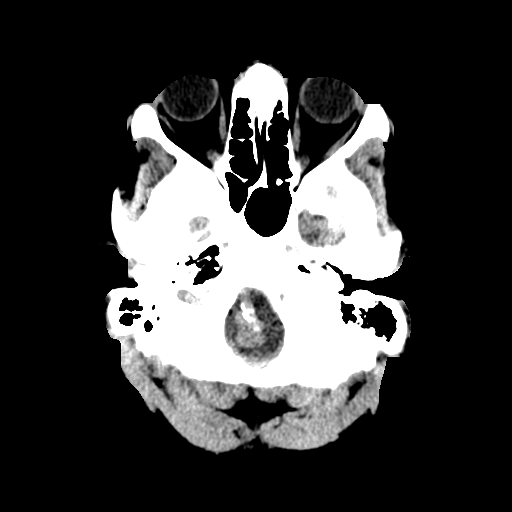
[im 4/29  bone]
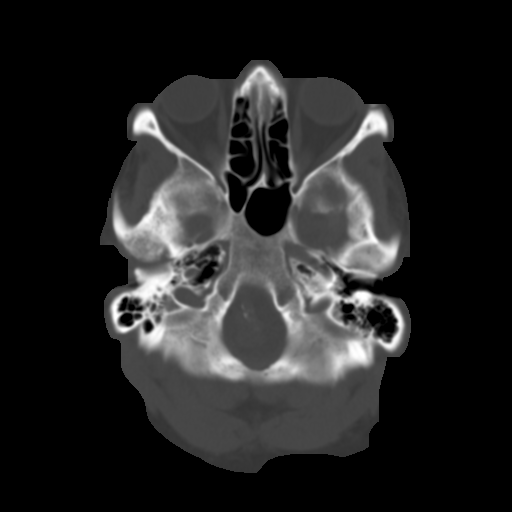
[im 8/29  brain]
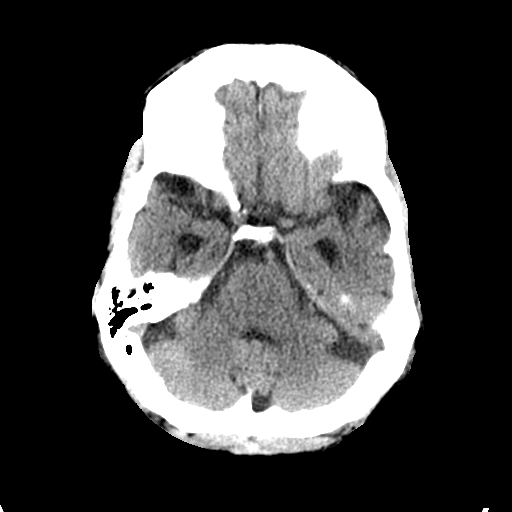
[im 11/29  brain]
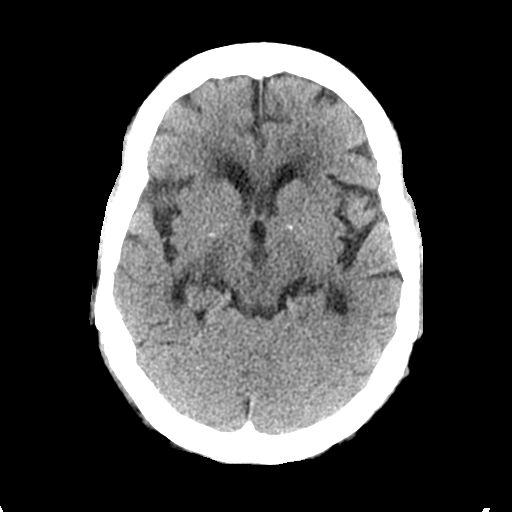
[im 15/29  brain]
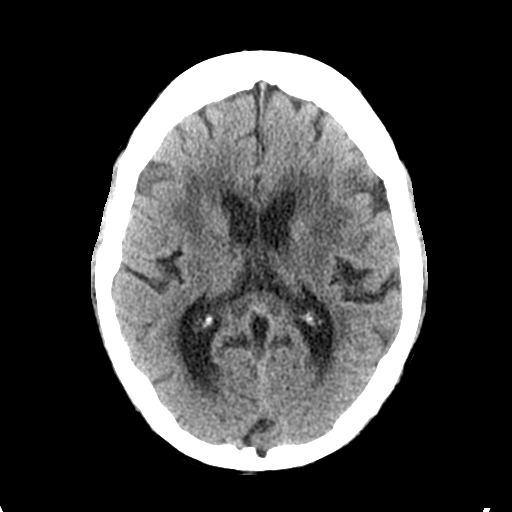
[im 18/29  brain]
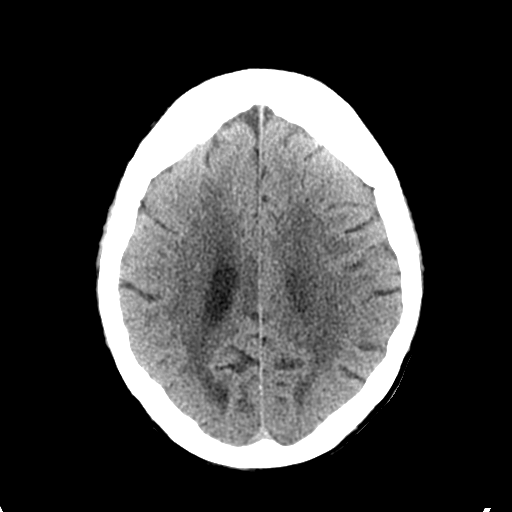
[im 18/29  bone]
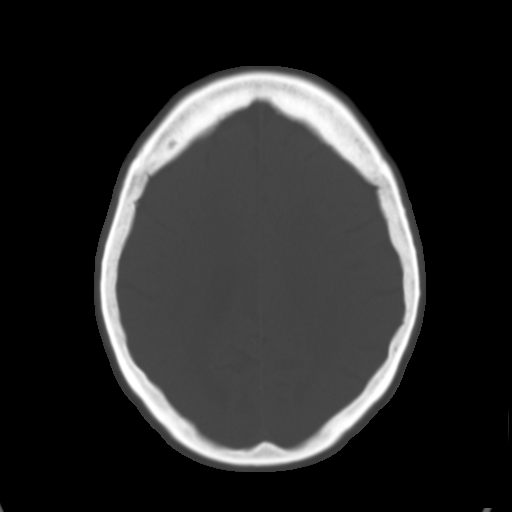
[im 22/29  brain]
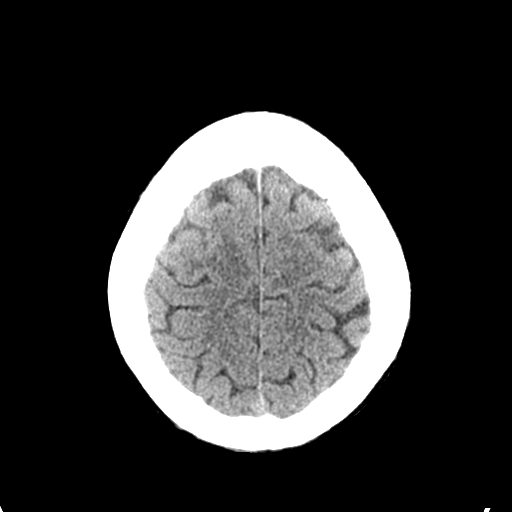
[im 25/29  brain]
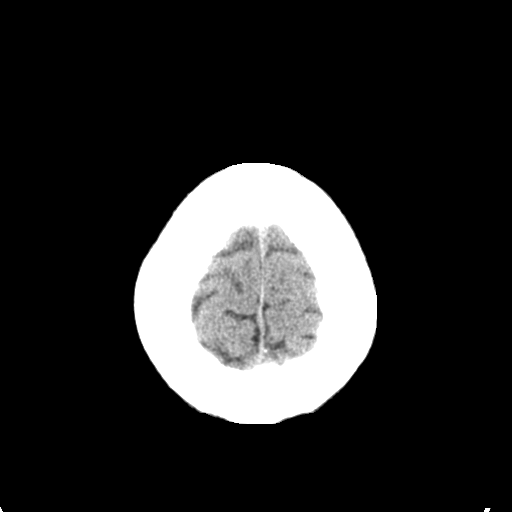

[Series 4: coronal soft tissue · coronal · 0.34mm/px · 3 of 64 slices shown]
[im 22/64  brain]
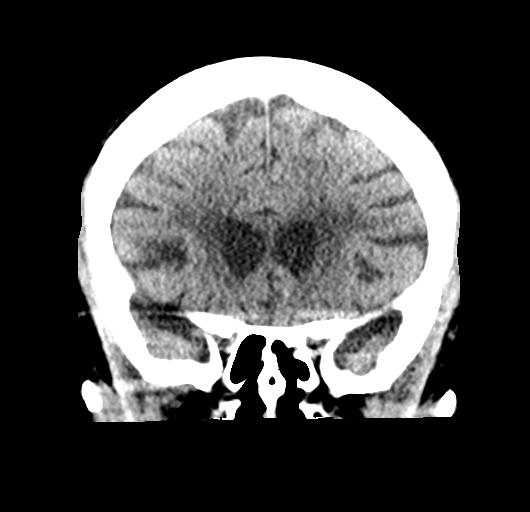
[im 29/64  brain]
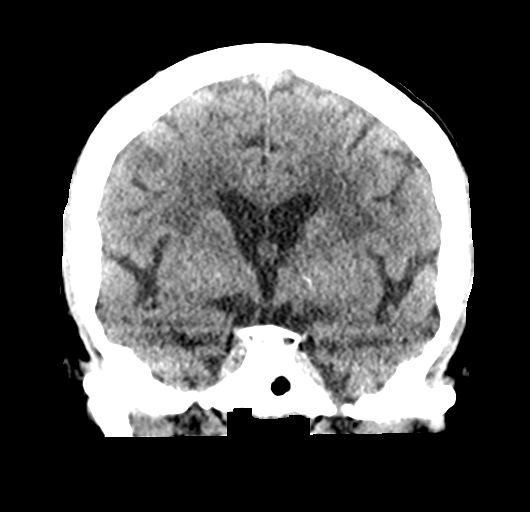
[im 36/64  brain]
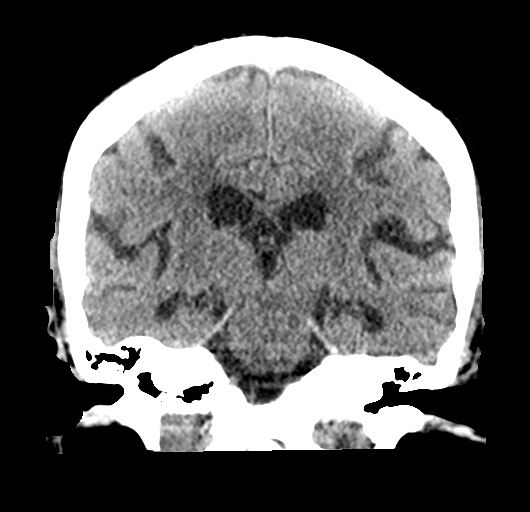

[Series 5: sagittal soft tissue · sagittal · 0.34mm/px · 3 of 53 slices shown]
[im 18/53  brain]
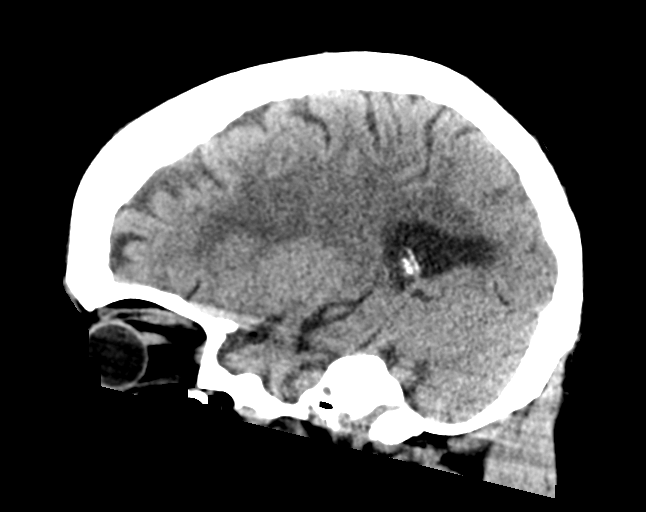
[im 27/53  brain]
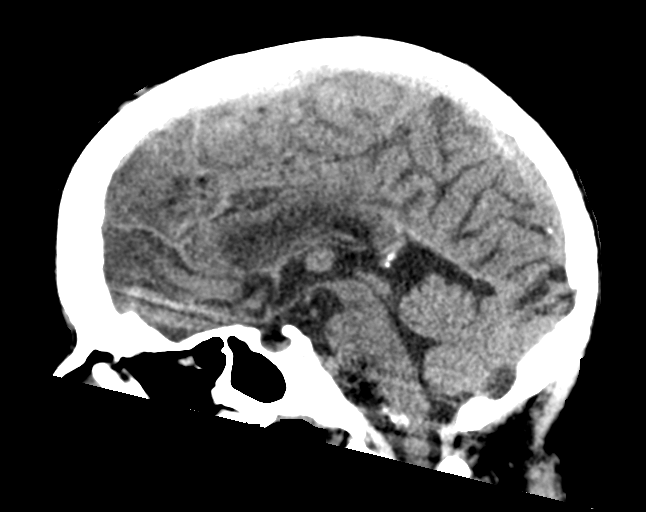
[im 35/53  brain]
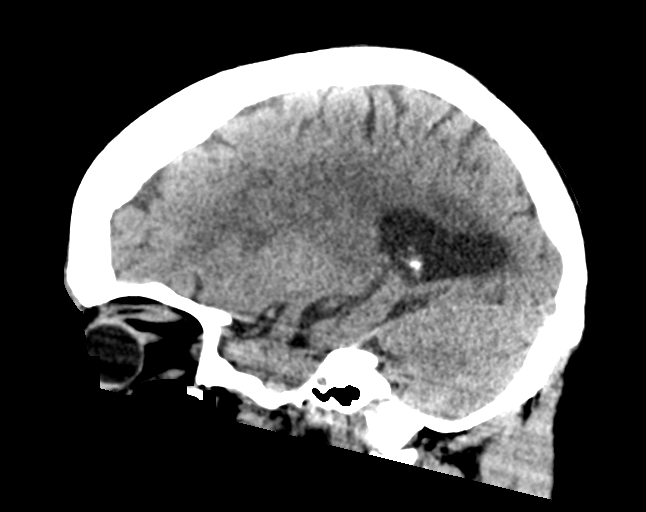

[16 of 47 positions shown; findings below may reference images not displayed]

FINDINGS: Brain:

Mild generalized cerebral atrophy.

Moderate patchy and ill-defined hypoattenuation within the cerebral
white matter, nonspecific but compatible with chronic small vessel
ischemic disease.

There is no acute intracranial hemorrhage.

No demarcated cortical infarct.

No extra-axial fluid collection.

No evidence of intracranial mass.

No midline shift.

Vascular: No hyperdense vessel.  Atherosclerotic calcifications.

Skull: Normal. Negative for fracture or focal lesion.

Sinuses/Orbits: Visualized orbits show no acute finding. Trace
bilateral ethmoid sinus mucosal thickening. Redemonstrated polyp
within the superior left nasal passage measuring 15 mm in AP
dimension (series 2, image 5).
IMPRESSION: No evidence of acute intracranial abnormality.

Stable non-contrast CT appearance of the brain as compared to
05/18/2020.

Moderate cerebral white matter chronic small vessel ischemic
disease.

Mild generalized cerebral atrophy.

Redemonstrated 15 mm polyp within the superior left nasal passage.

## 2022-06-03 ENCOUNTER — Emergency Department: Payer: Medicare Other

## 2022-06-03 ENCOUNTER — Emergency Department
Admission: EM | Admit: 2022-06-03 | Discharge: 2022-06-03 | Disposition: A | Discharge status: Home / Self Care | Payer: Medicare Other | Attending: Emergency Medicine | Admitting: Emergency Medicine

## 2022-06-03 ENCOUNTER — Other Ambulatory Visit: Payer: Self-pay

## 2022-06-03 DIAGNOSIS — Y93E5 Activity, floor mopping and cleaning: Secondary | ICD-10-CM | POA: Insufficient documentation

## 2022-06-03 DIAGNOSIS — E119 Type 2 diabetes mellitus without complications: Secondary | ICD-10-CM | POA: Diagnosis not present

## 2022-06-03 DIAGNOSIS — W208XXA Other cause of strike by thrown, projected or falling object, initial encounter: Secondary | ICD-10-CM | POA: Diagnosis not present

## 2022-06-03 DIAGNOSIS — F0390 Unspecified dementia without behavioral disturbance: Secondary | ICD-10-CM | POA: Insufficient documentation

## 2022-06-03 DIAGNOSIS — I129 Hypertensive chronic kidney disease with stage 1 through stage 4 chronic kidney disease, or unspecified chronic kidney disease: Secondary | ICD-10-CM | POA: Insufficient documentation

## 2022-06-03 DIAGNOSIS — S81812A Laceration without foreign body, left lower leg, initial encounter: Secondary | ICD-10-CM

## 2022-06-03 DIAGNOSIS — N189 Chronic kidney disease, unspecified: Secondary | ICD-10-CM | POA: Diagnosis not present

## 2022-06-03 DIAGNOSIS — S8992XA Unspecified injury of left lower leg, initial encounter: Secondary | ICD-10-CM | POA: Diagnosis present

## 2022-06-03 MED ORDER — LIDOCAINE-EPINEPHRINE 2 %-1:100000 IJ SOLN
10.0000 mL | Freq: Once | INTRAMUSCULAR | Status: AC
  Administered 2022-06-03: 10 mL via INTRADERMAL
  Filled 2022-06-03: qty 1

## 2022-06-03 NOTE — ED Provider Notes (Signed)
[facility] Provider Note    Event Date/Time   First MD Initiated Contact with Patient 06/03/22 1655     (approximate)   History   Laceration (Left tibia)   HPI  Chelsea Ruiz is a 82 y.o. female with history of chronic kidney disease, osteoarthritis, bradycardia, fibromyalgia, hypertension, type 2 diabetes, dementia, and as listed in EMR presents to the emergency department for treatment and evaluation of laceration to the left lower extremity.  Per EMS report, the janitor accidentally dropped a trash can on her leg.      Physical Exam   Triage Vital Signs: ED Triage Vitals [06/03/22 1537]  Enc Vitals Group     BP 118/65     Pulse Rate (!) 45     Resp 16     Temp 98.5 F (36.9 C)     Temp Source Oral     SpO2      Weight      Height      Head Circumference      Peak Flow      Pain Score      Pain Loc      Pain Edu?      Excl. in GC?     Most recent vital signs: Vitals:   06/03/22 1537  BP: 118/65  Pulse: (!) 45  Resp: 16  Temp: 98.5 F (36.9 C)    General: Awake, no distress.  CV:  Good peripheral perfusion.  Resp:  Normal effort.  Abd:  No distention.  Other:  8cm laceration to pretibial surface of left lower extremity.    ED Results / Procedures / Treatments   Labs (all labs ordered are listed, but only abnormal results are displayed) Labs Reviewed - No data to display   EKG  Not indicated.   RADIOLOGY  Image and radiology report reviewed and interpreted by me. Radiology report consistent with the same.  Not indicated.  PROCEDURES:  Critical Care performed: No  ..Laceration Repair  Date/Time: 06/03/2022 6:08 PM  Performed by: Chinita Pester, FNP Authorized by: Chinita Pester, FNP   Consent:    Consent obtained:  Verbal   Consent given by:  Patient   Risks discussed:  Poor wound healing Universal protocol:    Patient identity confirmed:  Arm band Laceration details:    Location:  Leg    Leg location:  L lower leg   Length (cm):  8 Pre-procedure details:    Preparation:  Patient was prepped and draped in usual sterile fashion Treatment:    Area cleansed with:  Povidone-iodine and saline   Layers/structures repaired:  Deep subcutaneous Deep subcutaneous:    Suture size:  4-0   Suture material:  Monocryl   Suture technique:  Running Skin repair:    Repair method:  Steri-Strips and tissue adhesive   Number of Steri-Strips:  4 Approximation:    Approximation:  Loose Repair type:    Repair type:  Complex Post-procedure details:    Dressing:  Sterile dressing   Procedure completion:  Tolerated well, no immediate complications    MEDICATIONS ORDERED IN ED:  Medications  lidocaine-EPINEPHrine (XYLOCAINE W/EPI) 2 %-1:100000 (with pres) injection 10 mL (10 mLs Intradermal Given by Other 06/03/22 1723)     IMPRESSION / MDM / ASSESSMENT AND PLAN / ED COURSE   I have reviewed the triage note.  Differential diagnosis includes, but is not limited to, skin tear, laceration  Patient's presentation is most consistent with  acute illness / injury with system symptoms.  82 year old female presenting to the emergency department via EMS after a trash can was dropped on her leg causing a laceration.  See HPI for further details.  Per report, patient is at her cognitive baseline.  She does not answer questions appropriately and is confused however she is calm and cooperative.  Wound was cleaned and repaired as described above. Last Tdap booster documented for 09/04/2021.  Wound care instructions written on discharge paperwork.  Nursing staff to plan for return back to care facility.      FINAL CLINICAL IMPRESSION(S) / ED DIAGNOSES   Final diagnoses:  Laceration of left lower extremity, initial encounter     Rx / DC Orders   ED Discharge Orders     None        Note:  This document was prepared using Dragon voice recognition software and may include unintentional  dictation errors.   Chinita Pester, FNP 06/03/22 Louretta Parma, MD 06/05/22 [phone]

## 2022-06-03 NOTE — ED Triage Notes (Signed)
Pt to ed from [facility] via [facility]. Pt has hx of dementia and is baseline mostly nonverbal. Janitor was cleaning her room and dropped a trashcan on her left leg. Pt has 3 inch laceration to left tibia region bleeding controlled.   117/61 68HR

## 2022-06-03 NOTE — Discharge Instructions (Addendum)
Keep wound clean and dry.  Allow steri strips to come off on their own.   Follow up with primary care for any sign or concern for infection.

## 2023-06-03 DEATH — deceased
